# Patient Record
Sex: Male | Born: 1950 | Race: Black or African American | Hispanic: No | Smoking: Never smoker
Health system: Southern US, Community
[De-identification: ages and names within clinical notes are randomized; demographics above are authoritative.]

## PROBLEM LIST (undated history)

## (undated) DIAGNOSIS — I428 Other cardiomyopathies: Secondary | ICD-10-CM

## (undated) DIAGNOSIS — I340 Nonrheumatic mitral (valve) insufficiency: Secondary | ICD-10-CM

## (undated) DIAGNOSIS — I5022 Chronic systolic (congestive) heart failure: Secondary | ICD-10-CM

## (undated) DIAGNOSIS — I471 Supraventricular tachycardia: Secondary | ICD-10-CM

## (undated) DIAGNOSIS — I10 Essential (primary) hypertension: Secondary | ICD-10-CM

## (undated) DIAGNOSIS — I4719 Other supraventricular tachycardia: Secondary | ICD-10-CM

## (undated) DIAGNOSIS — I4819 Other persistent atrial fibrillation: Secondary | ICD-10-CM

## (undated) DIAGNOSIS — I429 Cardiomyopathy, unspecified: Secondary | ICD-10-CM

## (undated) DIAGNOSIS — I4892 Unspecified atrial flutter: Secondary | ICD-10-CM

## (undated) HISTORY — DX: Unspecified atrial flutter: I48.92

## (undated) HISTORY — DX: Supraventricular tachycardia: I47.1

## (undated) HISTORY — DX: Nonrheumatic mitral (valve) insufficiency: I34.0

## (undated) HISTORY — DX: Other supraventricular tachycardia: I47.19

---

## 2007-10-12 ENCOUNTER — Ambulatory Visit: Payer: Self-pay | Admitting: Critical Care Medicine

## 2007-10-12 ENCOUNTER — Encounter: Payer: Self-pay | Admitting: Emergency Medicine

## 2007-10-12 ENCOUNTER — Inpatient Hospital Stay (HOSPITAL_COMMUNITY): Admission: AD | Admit: 2007-10-12 | Discharge: 2007-10-28 | Payer: Self-pay | Admitting: *Deleted

## 2007-10-15 ENCOUNTER — Encounter (INDEPENDENT_AMBULATORY_CARE_PROVIDER_SITE_OTHER): Payer: Self-pay | Admitting: *Deleted

## 2007-10-25 ENCOUNTER — Encounter: Payer: Self-pay | Admitting: Internal Medicine

## 2007-10-28 ENCOUNTER — Ambulatory Visit: Payer: Self-pay | Admitting: Vascular Surgery

## 2008-09-15 ENCOUNTER — Inpatient Hospital Stay (HOSPITAL_COMMUNITY): Admission: EM | Admit: 2008-09-15 | Discharge: 2008-09-27 | Payer: Self-pay | Admitting: Emergency Medicine

## 2008-09-16 ENCOUNTER — Encounter: Payer: Self-pay | Admitting: Cardiovascular Disease

## 2008-11-17 ENCOUNTER — Ambulatory Visit (HOSPITAL_COMMUNITY): Admission: RE | Admit: 2008-11-17 | Discharge: 2008-11-17 | Payer: Self-pay | Admitting: Cardiology

## 2010-03-21 HISTORY — PX: CARDIAC ELECTROPHYSIOLOGY STUDY AND ABLATION: SHX1294

## 2010-05-05 ENCOUNTER — Ambulatory Visit (HOSPITAL_COMMUNITY)
Admission: RE | Admit: 2010-05-05 | Discharge: 2010-05-05 | Disposition: A | Discharge status: Home / Self Care | Payer: 59 | Source: Ambulatory Visit | Attending: Cardiology | Admitting: Cardiology

## 2010-05-05 DIAGNOSIS — Z01818 Encounter for other preprocedural examination: Secondary | ICD-10-CM | POA: Insufficient documentation

## 2010-05-05 DIAGNOSIS — Z01810 Encounter for preprocedural cardiovascular examination: Secondary | ICD-10-CM | POA: Insufficient documentation

## 2010-05-05 DIAGNOSIS — I4891 Unspecified atrial fibrillation: Secondary | ICD-10-CM | POA: Insufficient documentation

## 2010-05-05 LAB — CBC
HCT: 40.7 % (ref 39.0–52.0)
MCH: 29.4 pg (ref 26.0–34.0)
MCHC: 33.4 g/dL (ref 30.0–36.0)
MCV: 88.1 fL (ref 78.0–100.0)
Platelets: 112 10*3/uL — ABNORMAL LOW (ref 150–400)
RDW: 14.6 % (ref 11.5–15.5)
WBC: 3.7 10*3/uL — ABNORMAL LOW (ref 4.0–10.5)

## 2010-05-05 LAB — BASIC METABOLIC PANEL
BUN: 16 mg/dL (ref 6–23)
CO2: 24 mEq/L (ref 19–32)
Chloride: 105 mEq/L (ref 96–112)
Potassium: 4.2 mEq/L (ref 3.5–5.1)

## 2010-05-05 LAB — MAGNESIUM: Magnesium: 2.2 mg/dL (ref 1.5–2.5)

## 2010-05-05 LAB — APTT: aPTT: 51 seconds — ABNORMAL HIGH (ref 24–37)

## 2010-05-19 NOTE — Op Note (Signed)
  [name], [name]               ACCOUNT NO.:  [id number]  MEDICAL RECORD NO.:  [id number]           PATIENT TYPE:  O  LOCATION:  [facility]                         FACILITY:  [facility]  PHYSICIAN:  Ritta Slot, MD     DATE OF BIRTH:  Aug 09, 1950  DATE OF PROCEDURE:  05/05/2010 DATE OF DISCHARGE:  05/05/2010                              OPERATIVE REPORT   This is a DC cardioversion.  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  His INR has also been greater than 2.0 for 4 consecutive weeks over the past 4 weeks, and thus it was decided to proceed with direct cardioversion without TEE.  After informed consent, the patient was induced with general anesthesia by Dr. Randa Evens who gave him 150 mg of IV propofol.  He received 1 biphasic synchronized shock with 150 joules, which showed normal sinus rhythm immediately.  PLAN:  The patient will follow up and will be discharged home in an hour.  Will continue all his outpatient medications.  He will follow up with me in 1 week.     Ritta Slot, MD     HS/MEDQ  D:  05/05/2010  T:  05/06/2010  Job:  [id number]  Electronically Signed by Ritta Slot MD on 05/19/2010 02:26:42 PM

## 2010-06-09 ENCOUNTER — Other Ambulatory Visit: Payer: Self-pay | Admitting: Cardiology

## 2010-06-09 ENCOUNTER — Ambulatory Visit
Admission: RE | Admit: 2010-06-09 | Discharge: 2010-06-09 | Disposition: A | Discharge status: Home / Self Care | Payer: 59 | Source: Ambulatory Visit | Attending: Cardiology | Admitting: Cardiology

## 2010-06-09 DIAGNOSIS — R0602 Shortness of breath: Secondary | ICD-10-CM

## 2010-06-10 ENCOUNTER — Ambulatory Visit (HOSPITAL_COMMUNITY)
Admission: RE | Admit: 2010-06-10 | Discharge: 2010-06-10 | Disposition: A | Discharge status: Home / Self Care | Payer: 59 | Source: Ambulatory Visit | Attending: Cardiology | Admitting: Cardiology

## 2010-06-10 ENCOUNTER — Encounter (HOSPITAL_COMMUNITY): Payer: 59

## 2010-06-10 ENCOUNTER — Inpatient Hospital Stay (HOSPITAL_COMMUNITY)
Admission: RE | Admit: 2010-06-10 | Discharge: 2010-06-10 | Disposition: A | Discharge status: Home / Self Care | Payer: 59 | Source: Ambulatory Visit | Attending: Cardiology | Admitting: Cardiology

## 2010-06-10 DIAGNOSIS — Z79899 Other long term (current) drug therapy: Secondary | ICD-10-CM | POA: Insufficient documentation

## 2010-06-10 DIAGNOSIS — R079 Chest pain, unspecified: Secondary | ICD-10-CM | POA: Insufficient documentation

## 2010-06-10 DIAGNOSIS — Z01811 Encounter for preprocedural respiratory examination: Secondary | ICD-10-CM | POA: Insufficient documentation

## 2010-06-10 DIAGNOSIS — I4891 Unspecified atrial fibrillation: Secondary | ICD-10-CM | POA: Insufficient documentation

## 2010-06-10 LAB — BLOOD GAS, ARTERIAL
Acid-Base Excess: 1.9 mmol/L (ref 0.0–2.0)
Bicarbonate: 25.7 mEq/L — ABNORMAL HIGH (ref 20.0–24.0)
Drawn by: 24231
pCO2 arterial: 38.6 mmHg (ref 35.0–45.0)
pO2, Arterial: 78.2 mmHg — ABNORMAL LOW (ref 80.0–100.0)

## 2010-06-11 ENCOUNTER — Ambulatory Visit (HOSPITAL_COMMUNITY)
Admission: RE | Admit: 2010-06-11 | Discharge: 2010-06-11 | Disposition: A | Discharge status: Home / Self Care | Payer: 59 | Source: Ambulatory Visit | Attending: Cardiology | Admitting: Cardiology

## 2010-06-11 DIAGNOSIS — Z8249 Family history of ischemic heart disease and other diseases of the circulatory system: Secondary | ICD-10-CM | POA: Insufficient documentation

## 2010-06-11 DIAGNOSIS — I10 Essential (primary) hypertension: Secondary | ICD-10-CM | POA: Insufficient documentation

## 2010-06-11 DIAGNOSIS — I4891 Unspecified atrial fibrillation: Secondary | ICD-10-CM | POA: Insufficient documentation

## 2010-06-11 HISTORY — PX: LOOP RECORDER IMPLANT: SHX5954

## 2010-06-11 LAB — PROTIME-INR: INR: 1.63 — ABNORMAL HIGH (ref 0.00–1.49)

## 2010-06-15 NOTE — Procedures (Signed)
NAMESAMARTH, OGLE               ACCOUNT NO.:  [id number]  MEDICAL RECORD NO.:  [id number]           PATIENT TYPE:  O  LOCATION:  [facility]                         FACILITY:  [facility]  PHYSICIAN:  Ritta Slot, MD     DATE OF BIRTH:  20-Jan-1951  DATE OF PROCEDURE:  06/11/2010 DATE OF DISCHARGE:  06/11/2010                           CARDIAC CATHETERIZATION   This is a left heart catheterization.  INDICATIONS:  Ms. Yehuda Printup is a very pleasant 60 year old African American gentleman who has a persistent intermittent atrial fibrillation.  He was seen at [facility] by Dr. Hurman Horn and is due to undergo convergent AFib ablation on July 15, 2010.  However, it was decided that he needed to have a diagnostic left heart catheterization to exclude coronary causes of mildly decreased LV function.  He was therefore brought to [facility].  After informed consent, he was placed in supine position.  His right wrist was prepped and draped in the usual sterile fashion.  APPROACH:  Right radial artery 5-French sheath.  SEDATION:  1 mg of Versed and 50 mcg of fentanyl.  INDICATIONS:  Atrial fibrillation, decreased EF, exclude CAD.  DESCRIPTION OF PROCEDURE:  After informed consent, the patient was placed in supine position.  His right wrist was prepped and draped in usual sterile fashion.  Using modified Seldinger technique, a 5-French sheath was placed in right radial artery.  Through the right radial artery was placed a Tiger catheter.  This was used to engage the left coronary circulation system.  Visualization of the LAD and circumflex were obtained in AP cranial, AP caudal, LAO cranial, LAO caudal, and RAO cranial views.  The Tiger catheter was attempted to engage in the right coronary artery, but was was unsuccessful.  We then exchanged the Tiger catheter over exchange wire for a JR-4 catheter.  This was engaged into the RCA without difficulty.   Visualization of the RCA was obtained in the LAO cranial and RAO cranial views.  The JR-4 catheter was then exchanged over the exchange wire for an angled pigtail.  Angled pigtail was placed in the LV without difficulty.  Visualization of the LV was obtained using 36 mL of contrast at 12 mL per second.  FINDINGS: 1. AO pressure 93/63 with mean of 76.  LV pressure 89/11, LVEDP of 17.     LV pullback 90/13, LVEDP of 19.  AO pullback 90/53, mean of 91/53,     mean of 71. 2. The right coronary artery was a small vessel, nondominant, was free     of any disease.  The left main bifurcated into an LAD and     circumflex.  The circumflex was a large dominant vessel giving rise     to three OMs.  There is no significant disease in the circumflex     artery.  The LAD was a large vessel giving rise to two diagonals.     There is no significant disease in the LAD diagonal territory.  LV     function was found to be 45% global hypokinesis.  No significant  mitral regurgitation.  FINAL CONCLUSIONS: 1. Normal coronaries, mild global LV dysfunction, most likely AFib     mediated. 2. No mitral regurgitation.  FINDINGS:  Normal cardiac catheterization.  The plan will be to go ahead and proceed with loop recorder implantation for AFIB monitoring.     Ritta Slot, MD     HS/MEDQ  D:  06/11/2010  T:  06/12/2010  Job:  [id number]  Electronically Signed by Ritta Slot MD on 06/15/2010 02:43:19 PM

## 2010-06-26 LAB — PROTIME-INR
INR: 2.6 — ABNORMAL HIGH (ref 0.00–1.49)
Prothrombin Time: 27.8 seconds — ABNORMAL HIGH (ref 11.6–15.2)

## 2010-06-27 LAB — BASIC METABOLIC PANEL
BUN: 12 mg/dL (ref 6–23)
BUN: 13 mg/dL (ref 6–23)
BUN: 13 mg/dL (ref 6–23)
BUN: 15 mg/dL (ref 6–23)
CO2: 29 mEq/L (ref 19–32)
CO2: 30 mEq/L (ref 19–32)
CO2: 31 mEq/L (ref 19–32)
CO2: 32 mEq/L (ref 19–32)
Calcium: 8.4 mg/dL (ref 8.4–10.5)
Calcium: 8.4 mg/dL (ref 8.4–10.5)
Calcium: 8.5 mg/dL (ref 8.4–10.5)
Calcium: 9 mg/dL (ref 8.4–10.5)
Calcium: 9 mg/dL (ref 8.4–10.5)
Chloride: 100 mEq/L (ref 96–112)
Creatinine, Ser: 1.42 mg/dL (ref 0.4–1.5)
Creatinine, Ser: 1.46 mg/dL (ref 0.4–1.5)
Creatinine, Ser: 1.47 mg/dL (ref 0.4–1.5)
Creatinine, Ser: 1.48 mg/dL (ref 0.4–1.5)
Creatinine, Ser: 1.56 mg/dL — ABNORMAL HIGH (ref 0.4–1.5)
Creatinine, Ser: 1.68 mg/dL — ABNORMAL HIGH (ref 0.4–1.5)
GFR calc Af Amer: 51 mL/min — ABNORMAL LOW (ref >60)
GFR calc Af Amer: 54 mL/min — ABNORMAL LOW (ref >60)
GFR calc Af Amer: >60 mL/min (ref >60)
GFR calc Af Amer: >60 mL/min (ref >60)
GFR calc non Af Amer: 42 mL/min — ABNORMAL LOW (ref >60)
GFR calc non Af Amer: 44 mL/min — ABNORMAL LOW (ref >60)
GFR calc non Af Amer: 46 mL/min — ABNORMAL LOW (ref >60)
GFR calc non Af Amer: 49 mL/min — ABNORMAL LOW (ref >60)
Glucose, Bld: 103 mg/dL — ABNORMAL HIGH (ref 70–99)
Glucose, Bld: 163 mg/dL — ABNORMAL HIGH (ref 70–99)
Glucose, Bld: 89 mg/dL (ref 70–99)
Glucose, Bld: 99 mg/dL (ref 70–99)
Potassium: 4.4 mEq/L (ref 3.5–5.1)
Sodium: 132 mEq/L — ABNORMAL LOW (ref 135–145)

## 2010-06-27 LAB — PROTIME-INR
INR: 1.4 (ref 0.00–1.49)
INR: 1.5 (ref 0.00–1.49)
INR: 1.6 — ABNORMAL HIGH (ref 0.00–1.49)
INR: 1.8 — ABNORMAL HIGH (ref 0.00–1.49)
INR: 2.6 — ABNORMAL HIGH (ref 0.00–1.49)
INR: 3.4 — ABNORMAL HIGH (ref 0.00–1.49)
Prothrombin Time: 18.4 seconds — ABNORMAL HIGH (ref 11.6–15.2)
Prothrombin Time: 19.3 seconds — ABNORMAL HIGH (ref 11.6–15.2)
Prothrombin Time: 22.1 seconds — ABNORMAL HIGH (ref 11.6–15.2)
Prothrombin Time: 29.3 seconds — ABNORMAL HIGH (ref 11.6–15.2)
Prothrombin Time: 30.6 seconds — ABNORMAL HIGH (ref 11.6–15.2)

## 2010-06-27 LAB — DIGOXIN LEVEL
Digoxin Level: 0.6 ng/mL — ABNORMAL LOW (ref 0.8–2.0)
Digoxin Level: 1 ng/mL (ref 0.8–2.0)

## 2010-06-27 LAB — CBC
HCT: 43.5 % (ref 39.0–52.0)
Hemoglobin: 14.6 g/dL (ref 13.0–17.0)
Hemoglobin: 15.1 g/dL (ref 13.0–17.0)
Hemoglobin: 15.3 g/dL (ref 13.0–17.0)
Hemoglobin: 16.3 g/dL (ref 13.0–17.0)
MCHC: 33.6 g/dL (ref 30.0–36.0)
MCHC: 33.8 g/dL (ref 30.0–36.0)
MCHC: 33.8 g/dL (ref 30.0–36.0)
MCHC: 33.9 g/dL (ref 30.0–36.0)
MCHC: 34.2 g/dL (ref 30.0–36.0)
MCV: 94.4 fL (ref 78.0–100.0)
Platelets: 109 10*3/uL — ABNORMAL LOW (ref 150–400)
Platelets: 112 10*3/uL — ABNORMAL LOW (ref 150–400)
Platelets: 114 10*3/uL — ABNORMAL LOW (ref 150–400)
Platelets: 117 10*3/uL — ABNORMAL LOW (ref 150–400)
Platelets: 120 10*3/uL — ABNORMAL LOW (ref 150–400)
RBC: 4.81 MIL/uL (ref 4.22–5.81)
RBC: 4.82 MIL/uL (ref 4.22–5.81)
RDW: 14.2 % (ref 11.5–15.5)
RDW: 14.2 % (ref 11.5–15.5)
RDW: 14.4 % (ref 11.5–15.5)
RDW: 14.4 % (ref 11.5–15.5)
RDW: 14.4 % (ref 11.5–15.5)
RDW: 14.8 % (ref 11.5–15.5)
WBC: 7.6 10*3/uL (ref 4.0–10.5)

## 2010-06-27 LAB — MAGNESIUM
Magnesium: 1.8 mg/dL (ref 1.5–2.5)
Magnesium: 2.2 mg/dL (ref 1.5–2.5)

## 2010-06-27 LAB — COMPREHENSIVE METABOLIC PANEL
Alkaline Phosphatase: 69 U/L (ref 39–117)
BUN: 13 mg/dL (ref 6–23)
BUN: 15 mg/dL (ref 6–23)
CO2: 27 mEq/L (ref 19–32)
Calcium: 8.8 mg/dL (ref 8.4–10.5)
Chloride: 91 mEq/L — ABNORMAL LOW (ref 96–112)
Chloride: 94 mEq/L — ABNORMAL LOW (ref 96–112)
Creatinine, Ser: 1.55 mg/dL — ABNORMAL HIGH (ref 0.4–1.5)
Creatinine, Ser: 1.56 mg/dL — ABNORMAL HIGH (ref 0.4–1.5)
GFR calc non Af Amer: 46 mL/min — ABNORMAL LOW (ref >60)
Glucose, Bld: 100 mg/dL — ABNORMAL HIGH (ref 70–99)
Glucose, Bld: 95 mg/dL (ref 70–99)
Potassium: 3.8 mEq/L (ref 3.5–5.1)
Total Bilirubin: 1.2 mg/dL (ref 0.3–1.2)
Total Bilirubin: 1.3 mg/dL — ABNORMAL HIGH (ref 0.3–1.2)
Total Protein: 6.8 g/dL (ref 6.0–8.3)

## 2010-06-27 LAB — HEPARIN LEVEL (UNFRACTIONATED)
Heparin Unfractionated: 0.49 IU/mL (ref 0.30–0.70)
Heparin Unfractionated: 0.54 IU/mL (ref 0.30–0.70)
Heparin Unfractionated: 0.73 IU/mL — ABNORMAL HIGH (ref 0.30–0.70)
Heparin Unfractionated: 0.75 IU/mL — ABNORMAL HIGH (ref 0.30–0.70)
Heparin Unfractionated: 0.97 IU/mL — ABNORMAL HIGH (ref 0.30–0.70)

## 2010-06-27 LAB — BRAIN NATRIURETIC PEPTIDE
Pro B Natriuretic peptide (BNP): 659 pg/mL — ABNORMAL HIGH (ref 0.0–100.0)
Pro B Natriuretic peptide (BNP): 726 pg/mL — ABNORMAL HIGH (ref 0.0–100.0)
Pro B Natriuretic peptide (BNP): 914 pg/mL — ABNORMAL HIGH (ref 0.0–100.0)

## 2010-06-28 LAB — BASIC METABOLIC PANEL
BUN: 22 mg/dL (ref 6–23)
CO2: 25 mEq/L (ref 19–32)
CO2: 28 mEq/L (ref 19–32)
Calcium: 8.7 mg/dL (ref 8.4–10.5)
Calcium: 8.8 mg/dL (ref 8.4–10.5)
Chloride: 102 mEq/L (ref 96–112)
Chloride: 106 mEq/L (ref 96–112)
Creatinine, Ser: 1.83 mg/dL — ABNORMAL HIGH (ref 0.4–1.5)
Creatinine, Ser: 1.93 mg/dL — ABNORMAL HIGH (ref 0.4–1.5)
GFR calc Af Amer: 44 mL/min — ABNORMAL LOW (ref >60)
GFR calc Af Amer: 46 mL/min — ABNORMAL LOW (ref >60)
GFR calc Af Amer: 52 mL/min — ABNORMAL LOW (ref >60)
GFR calc non Af Amer: 43 mL/min — ABNORMAL LOW (ref >60)
Glucose, Bld: 103 mg/dL — ABNORMAL HIGH (ref 70–99)
Glucose, Bld: 86 mg/dL (ref 70–99)
Potassium: 2.7 mEq/L — CL (ref 3.5–5.1)
Potassium: 3.7 mEq/L (ref 3.5–5.1)
Sodium: 140 mEq/L (ref 135–145)
Sodium: 141 mEq/L (ref 135–145)

## 2010-06-28 LAB — PROTIME-INR
INR: 1.3 (ref 0.00–1.49)
Prothrombin Time: 17 seconds — ABNORMAL HIGH (ref 11.6–15.2)
Prothrombin Time: 18.1 seconds — ABNORMAL HIGH (ref 11.6–15.2)

## 2010-06-28 LAB — CBC
HCT: 43.9 % (ref 39.0–52.0)
HCT: 45.5 % (ref 39.0–52.0)
Hemoglobin: 14.9 g/dL (ref 13.0–17.0)
MCHC: 33.8 g/dL (ref 30.0–36.0)
MCV: 95.2 fL (ref 78.0–100.0)
MCV: 95.3 fL (ref 78.0–100.0)
MCV: 95.4 fL (ref 78.0–100.0)
Platelets: 100 10*3/uL — ABNORMAL LOW (ref 150–400)
Platelets: 95 10*3/uL — ABNORMAL LOW (ref 150–400)
RBC: 4.43 MIL/uL (ref 4.22–5.81)
RBC: 4.61 MIL/uL (ref 4.22–5.81)
RDW: 14.6 % (ref 11.5–15.5)
WBC: 6.2 10*3/uL (ref 4.0–10.5)

## 2010-06-28 LAB — HEPATIC FUNCTION PANEL
ALT: 47 U/L (ref 0–53)
AST: 51 U/L — ABNORMAL HIGH (ref 0–37)
Albumin: 3 g/dL — ABNORMAL LOW (ref 3.5–5.2)
Alkaline Phosphatase: 54 U/L (ref 39–117)
Total Bilirubin: 1.9 mg/dL — ABNORMAL HIGH (ref 0.3–1.2)

## 2010-06-28 LAB — DIFFERENTIAL
Basophils Relative: 1 % (ref 0–1)
Eosinophils Absolute: 0 10*3/uL (ref 0.0–0.7)
Eosinophils Relative: 0 % (ref 0–5)
Monocytes Absolute: 0.5 10*3/uL (ref 0.1–1.0)
Monocytes Relative: 7 % (ref 3–12)

## 2010-06-28 LAB — CARDIAC PANEL(CRET KIN+CKTOT+MB+TROPI)
CK, MB: 2.8 ng/mL (ref 0.3–4.0)
CK, MB: 3.3 ng/mL (ref 0.3–4.0)
Relative Index: 0.8 (ref 0.0–2.5)
Total CK: 387 U/L — ABNORMAL HIGH (ref 7–232)
Total CK: 411 U/L — ABNORMAL HIGH (ref 7–232)
Troponin I: 0.04 ng/mL (ref 0.00–0.06)
Troponin I: 0.04 ng/mL (ref 0.00–0.06)

## 2010-06-28 LAB — HEPARIN LEVEL (UNFRACTIONATED)
Heparin Unfractionated: 0.49 IU/mL (ref 0.30–0.70)
Heparin Unfractionated: 0.6 IU/mL (ref 0.30–0.70)
Heparin Unfractionated: 0.77 IU/mL — ABNORMAL HIGH (ref 0.30–0.70)

## 2010-06-28 LAB — URINALYSIS, ROUTINE W REFLEX MICROSCOPIC
Bilirubin Urine: NEGATIVE
Hgb urine dipstick: NEGATIVE
Specific Gravity, Urine: 1.006 (ref 1.005–1.030)
Urobilinogen, UA: 1 mg/dL (ref 0.0–1.0)

## 2010-06-28 LAB — POCT I-STAT, CHEM 8
BUN: 23 mg/dL (ref 6–23)
Calcium, Ion: 1.06 mmol/L — ABNORMAL LOW (ref 1.12–1.32)
Chloride: 106 mEq/L (ref 96–112)
Glucose, Bld: 114 mg/dL — ABNORMAL HIGH (ref 70–99)

## 2010-06-28 LAB — DIGOXIN LEVEL: Digoxin Level: <0.2 ng/mL — ABNORMAL LOW (ref 0.8–2.0)

## 2010-06-28 LAB — CK TOTAL AND CKMB (NOT AT ARMC): Relative Index: 1.2 (ref 0.0–2.5)

## 2010-06-28 LAB — BRAIN NATRIURETIC PEPTIDE: Pro B Natriuretic peptide (BNP): 819 pg/mL — ABNORMAL HIGH (ref 0.0–100.0)

## 2010-06-28 LAB — LIPID PANEL
Cholesterol: 138 mg/dL (ref 0–200)
Triglycerides: 36 mg/dL (ref <150)

## 2010-06-28 LAB — MAGNESIUM: Magnesium: 1.6 mg/dL (ref 1.5–2.5)

## 2010-06-28 LAB — APTT: aPTT: 28 seconds (ref 24–37)

## 2010-06-28 LAB — PHOSPHORUS: Phosphorus: 4.8 mg/dL — ABNORMAL HIGH (ref 2.3–4.6)

## 2010-06-28 LAB — POCT CARDIAC MARKERS
CKMB, poc: 1.5 ng/mL (ref 1.0–8.0)
Troponin i, poc: <0.05 ng/mL (ref 0.00–0.09)

## 2010-08-03 NOTE — Discharge Summary (Signed)
[name], [name]               ACCOUNT NO.:  [id number]   MEDICAL RECORD NO.:  [id number]          PATIENT TYPE:  INP   LOCATION:  2034                         FACILITY:  [facility]   PHYSICIAN:  Raymon Mutton, P.A. DATE OF BIRTH:  1950-08-03   DATE OF ADMISSION:  10/12/2007  DATE OF DISCHARGE:  10/28/2007                               DISCHARGE SUMMARY   DISCHARGING PHYSICIAN:  Dr. Tresa Endo.   DISCHARGE DIAGNOSES:  1. Status post acute vent-dependent respiratory failure - resolved.  2. Atrial fibrillation, new onset with rapid ventricular response.      Status post unsuccessful DCCV on October 15, 2007, - the patient      converted to sinus rhythm on amiodarone therapy.  3. 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  4. Severe mitral regurgitation.  5. Coumadin anticoagulation therapy - subtherapeutic INR on discharge.  6. New onset systolic congestive heart failure.  7. Recent bronchitis - resolved.  8. Hyperuricemia.  9. Obesity.  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   HOSPITAL COURSE:  This is a 60 year old African American gentleman  without any significant previous history of coronary artery disease,  diabetes, or hypertension, who considered himself healthy up until this  hospital admission. He recently had a mild case of bronchitis, which was  treated with Levaquin antibiotics, and the patient presented to the  [facility] Emergency Department with complaints of  palpitations, lower extremity swelling, and shortness of breath.  In the  emergency room, he was put on telemonitor.  His heart rate revealed  atrial fibrillation with rapid ventricular response at 170 beats per  minute.  The patient was started on Cardizem drip and heparin.  We  cycled his cardiac enzymes that revealed abnormal CPK, but normal MB and  troponins.  His TSH was normal as well, but uric acid revealed abnormal  elevated result at 10.7.   We started him on IV diuretics and the patient responded greatly.  His  urinary output was around 60-100 mL with fluid intake 2780 on October 14, 2006.  He continues to give out significant amount of fluids on IV  diuretics, and his heart rate remained hard to control on Cardizem drip,  so amiodarone IV was added and the patient was given p.o. digoxin.  Eventually, the heart rate improved, but the patient still remained in  atrial fibrillation and an attempt was made to have a TEE-guided  cardioversion on October 15, 2007, by Dr. Lynnea Ferrier, but the patient did not  hold sinus rhythm even after three shocks, and decision was made to  proceed with rate-control medication, so we continue loading with  digoxin, amiodarone, and Cardizem p.o., as well as beta-blockers.   On October 17, 2007, the patient developed severe stridor and underwent MRI  of the neck, which revealed abnormalities - edema versus hematoma with  some displacement of vocal cords.  The stridors remained persistent  during the day, so  we asked ENT specialist to evaluate the patient.  Dr.  Suszanne Conners saw the patient and diagnosed him with progressive laryngeal airway  obstruction secondary to increasing laryngeal swelling and 95% occlusion  of the glottic opening evolving into respiratory distress with  significant stridor.  The patient required tracheostomy and procedure  was performed under the general anesthesia.  He tolerated it well and  was kept on a ventilator for a couple more days.  Eventually, on October 19, 2007, was weaned off mechanical ventilation.  The patient was  continued to be treated with IV heparin and was started him on Coumadin  after he was cleared by ENT, but unfortunately his INR remained  persistently subtherapeutic until the day of discharge.  On October 26, 2007, the patient converted to sinus rhythm on amiodarone  therapy.  An  EKG was performed to confirm it.  We eventually switched him to Lovenox  prior to his discharge to home and allowed him to go home on Lovenox,  Coumadin bridge with plan to follow up his INR on October 30, 2007.   HOSPITAL LABORATORIES:  His enzymes as mentioned were negative and he  was ruled out for MI.  Lipids reveal normal total cholesterol and  triglycerides, but his HDL was low at 27 and LDL was 103.  His initial  BNP was 895 and BNP on the day of discharge was 103.   Hemoglobin was 13.9 and hematocrit 42.3.  White blood cell count 5.2,  platelets 213, sodium 138, potassium 4.38, BUN 13, creatinine 135,  chloride 100, CO2 32, and glucose 91.   INR at the day of discharge was 1.4 and the patient was sent home on 7.5  mg of Coumadin and 120 mg of Lovenox every 12 hours.   Digoxin level was 0.8 and magnesium 2.2.   DISCHARGE MEDICATIONS:  1. Lovenox 120 mg b.i.d.  2. Lopressor 50 mg b.i.d.  3. Cardizem 360 mg daily.  4. Lanoxin 0.25 mg daily.  5. Amiodarone 200 mg b.i.d.  6. Lasix 40 mg twice a day in a.m. and at 2:00 p.m.  7. Coumadin 7.5 mg q.p.m.   DISCHARGE FOLLOWUP:  Our office will contact the patient to schedule him  for followup appointment with Dr. Lynnea Ferrier.      Raymon Mutton, P.A.     MK/MEDQ  D:  10/28/2007  T:  10/29/2007  Job:  [id number]   cc:   Ritta Slot, MD  [facility]

## 2010-08-03 NOTE — Discharge Summary (Signed)
[name], [name]               ACCOUNT NO.:  [id number]   MEDICAL RECORD NO.:  [id number]          PATIENT TYPE:  INP   LOCATION:  2003                         FACILITY:  [facility]   PHYSICIAN:  Ritta Slot, MD     DATE OF BIRTH:  Mar 25, 1950   DATE OF ADMISSION:  09/15/2008  DATE OF DISCHARGE:  09/27/2008                               DISCHARGE SUMMARY   DISCHARGE DIAGNOSES:  1. Acute-on-chronic systolic heart failure, improved at discharge.  2. Paroxysmal atrial fibrillation, sinus rhythm at discharge on      amiodarone.  3. Coumadin therapy, INR 3.9 at discharge.  4. Renal insufficiency with a creatinine of 1.5.  5. Presumed nonischemic cardiomyopathy with an ejection fraction of 20-      25% by echo with right ventricular dysfunction and severe mitral      regurgitation and markedly enlarged left atrium.  6. Nonischemic Myoview, October 2009.  7. Bursitis, right hip, seen by Dr. Carola Frost, this admission, improving      at discharge.   HOSPITAL COURSE:  The patient is a 60 year old male followed by Dr.  Lynnea Ferrier who was sent to the [facility] ER from Urgent Care with AFib with  rapid ventricular response of 180.  On previous 2 weeks, the patient had  been dyspneic.  He has had previous history of atrial fibrillation in  July 2009.  EF at that time was 25% with severe MR.  He had an  unsuccessful cardioversion, which was complicated by laryngeal hematoma  and required tracheostomy.  By October 2009, he had had a negative  Myoview study and his EF then improved to 60%.  He was on Coumadin and  amiodarone for about 6 months and then this was discontinued.  The  patient was seen by Dr. Mariah Milling on admission and was found to be in rapid  atrial fibrillation and heart failure.  His BNP was 2225.  Creatinine  was 2.1.  He was admitted to telemetry, started on IV heparin and beta-  blockers and diltiazem were added as well as IV diuretics.  His rate was  difficult to control.  He improved with  diuresis, but his rate remained  labile.  It was decided not to proceed with diagnostic catheterization  based on his recent negative Myoview and renal insufficiency.  We hope  his ejection fraction would improve with sinus rhythm.  His EF is 20-  25%.  He was put on amiodarone and we adjusted his medications for rate  control.  He required fairly high doses of rate control medications.  Eventually, he converted to sinus rhythm on September 27, 2008.  In the  interim, the patient did develop some bursitis in his right hip and was  seen in consult by Dr. Carola Frost with [facility].  He improved with  physical therapy and treatment including low-dose anti-inflammatories  and ice, and physical therapy.  We feel, he can be discharged on September 27, 2008.  He will need to follow up with Dr. Lynnea Ferrier.  Dr. Carola Frost would  like to see him if he does not improve in a  couple weeks.   DISCHARGE MEDICATIONS:  1. Lasix 40 mg a day.  2. Diltiazem 180 mg a day.  3. Amiodarone 400 mg a day.  4. Lanoxin 0.25 mg a day.  5. Metoprolol 50 mg b.i.d.  6. Warfarin as directed.   He will be given a prescription for 5 mg of warfarin, although I should  note that he has not had any warfarin until the last 3 days and his INR  is 3.9.  We will have him hold his warfarin today and Sunday, and take  2.5 Monday and have a Protime on Tuesday.   LABORATORIES AT DISCHARGE:  Sodium 138, potassium 3.8, BUN 15, and  creatinine 1.56.  Digoxin level is 1.0.  EKG shows sinus rhythm.  Discharge weight is 108.5 kg.  INR is 3.5.  Digoxin level is 1.0.  Liver  functions are essentially normal, his SGOT is slightly elevated at 50.  TSH 0.68.  Lipid panel shows cholesterol 138, triglycerides 36, HDL 27,  and LDL 104.  Troponins and CK-MB are negative.  Magnesium is 2.2.  Head  films showed no acute findings, there is an old healed lesser trochanter  fracture.  Pelvic film showed mild degenerative disease of the SI  joints.  Chest  x-ray shows cardiomegaly and right basilar atelectasis.  Echocardiogram shows an EF of 20-25% with severe MR, moderate to  severely dilated left atrium; moderately dilated RV with moderate to  severe reduction in his RV function.  Right atrium was moderately  dilated as well.  Pulmonary artery peak pressure is 33 mm.   DISPOSITION:  The patient is discharged in stable condition.  He will  follow up with Dr. Lynnea Ferrier.  He will have a Protime next week.      Abelino Derrick, P.A.      Ritta Slot, MD  Electronically Signed    LKK/MEDQ  D:  09/27/2008  T:  09/27/2008  Job:  [id number]   cc:   Ritta Slot, MD  Doralee Albino. Carola Frost, M.D.

## 2010-08-03 NOTE — Op Note (Signed)
[name], [name]               ACCOUNT NO.:  [id number]   MEDICAL RECORD NO.:  [id number]          PATIENT TYPE:  INP   LOCATION:  2912                         FACILITY:  [facility]   PHYSICIAN:  Newman Pies, MD            DATE OF BIRTH:  Oct 25, 1950   DATE OF PROCEDURE:  10/17/2007  DATE OF DISCHARGE:                               OPERATIVE REPORT   SURGEON:  Newman Pies, MD   PREOPERATIVE DIAGNOSES:  1. Laryngeal airway obstruction.  2. Respiratory distress, with significant stridor.   POSTOPERATIVE DIAGNOSES:  1. Laryngeal airway obstruction.  2. Respiratory distress, with significant stridor.   PROCEDURE PERFORMED:  Tracheostomy.   ANESTHESIA:  General endotracheal tube anesthesia.   COMPLICATIONS:  None.   ESTIMATED BLOOD LOSS:  None.   INDICATIONS FOR PROCEDURE:  The patient is a 60 year old African  American male with history of congestive heart failure, atrial  fibrillation, and mitral valve regurgitation.  Since Monday, October 15, 2007, the patient has been complaining of progressive dyspnea and  increasing stridor.  An MRI scan was performed on October 17, 2007.  It  showed significant soft tissue obstruction of the laryngeal opening.  A  subsequent bedside fiberoptic laryngoscopy shows a large soft tissue  mass/hematoma of the laryngeal inlet.  The soft tissue edema/hematoma  appeared to obstruct more than 95% of the laryngeal opening.  Based on  the findings, the decision was made for the patient to be intubated, and  the tracheostomy tube placed to protect his airway.  The risks,  benefits, alternatives, and details of the procedure were discussed with  the patient and his family members.  Questions were invited and  answered.  They would like to proceed with the procedure.  Informed  consent was obtained.   DESCRIPTION OF PROCEDURE:  The patient was taken to the operating room  and placed supine on the operating table.  It should be noted that the  patient was intubated  prior to arrival to the OR due to his progressive  respiratory distress.  Lidocaine 1% with 1:100,000 epinephrine was  injected at the planned site of incision.  The patient was given 1 g of  Ancef prior to the incision.  A 2-cm vertical incision was made at the  level slightly below the cricoid.  The vertical incision was carried  down to the level of the platysma muscles.  The vertical incision was  carried down to the level of the strap muscles.  The strap muscles were  divided at midline and retracted laterally.  The thyroid isthmus was  identified.  The thyroid isthmus was divided at midline and retracted  laterally.  The anterior tracheal wall was identified.  A tracheal  window was made at the level of the third tracheal ring.  The  endotracheal tube was subsequently withdrawn.  A #8 cuffed Shiley  tracheostomy tube was placed without difficulty.  The tracheostomy tube  was sutured in place with 2-0 Prolene sutures and a circumferential neck  tie.  The care of the patient was turned over to  the anesthesiologist.  The patient was awakened from anesthesia without difficulty.  He was  transferred back to the intensive care unit in stable condition.   OPERATIVE FINDINGS:  A #8 cuffed Shiley tracheostomy tube was placed  without difficulty.   SPECIMENS REMOVED:  None.   FOLLOWUP CARE:  The patient will be returned to the intensive care unit.  Once the patient's laryngeal obstruction improves, the tracheostomy tube  could be decannulated at that time.      Newman Pies, MD  Electronically Signed     ST/MEDQ  D:  10/17/2007  T:  10/18/2007  Job:  [id number]

## 2010-08-03 NOTE — Discharge Summary (Signed)
NAMEJERAMIAH, MCCAUGHEY               ACCOUNT NO.:  [id number]   MEDICAL RECORD NO.:  [id number]          PATIENT TYPE:  INP   LOCATION:  2003                         FACILITY:  [facility]   PHYSICIAN:  Antonieta Iba, MD   DATE OF BIRTH:  Sep 18, 1950   DATE OF ADMISSION:  09/15/2008  DATE OF DISCHARGE:  09/27/2008                               DISCHARGE SUMMARY   ADDENDUM   We told Mr. [place], he can take Advil 600 mg twice a day for hip pain  if needed, but he should probably stop this after about 5 days.      Abelino Derrick, P.A.      Antonieta Iba, MD  Electronically Signed    LKK/MEDQ  D:  09/27/2008  T:  09/27/2008  Job:  [id number]

## 2010-08-03 NOTE — H&P (Signed)
NAMEJAEVION, GOTO               ACCOUNT NO.:  [id number]   MEDICAL RECORD NO.:  [id number]          PATIENT TYPE:  OIB   LOCATION:  2899                         FACILITY:  [facility]   PHYSICIAN:  Ritta Slot, MD     DATE OF BIRTH:  April 19, 1950   DATE OF ADMISSION:  11/17/2008  DATE OF DISCHARGE:  11/17/2008                              HISTORY & PHYSICAL   This is a cardioversion dictation.   PATIENT PROFILE:  [name] is a very pleasant 60 year old African  American gentleman with a history of paroxysmal atrial fibrillation and  tachycardia-mediated cardiomyopathy.  He presented in June of this year  after maintaining sinus rhythm over 6 months in atrial fibrillation and  EF of 25% and severe MR.  He was started on amiodarone, metoprolol,  diltiazem, and digoxin to try to restore normal sinus rhythm.  Unfortunately, this did not work.  He continues to manage atrial  fibrillation with a rate of 60-65 beats per minute on Coumadin but in  atrial fibrillation.  He was therefore brought to [facility]  unit for cardioversion to normal sinus rhythm.   After informed consent, the patient was given 100 mg propofol IV for  general anesthesia.  He received 1 time to 150 joules synchronized shock  which restored normal sinus rhythm straightaway immediately.  He has  otherwise done well without any complications.   CONCLUSIONS:  Successful cardioversion to normal sinus rhythm.   PLAN:  He will discontinue his metoprolol, diltiazem, and digoxin.  We  will decrease amiodarone down to 200 mg daily.  He will follow up with  me in 2 weeks.      Ritta Slot, MD  Electronically Signed     HS/MEDQ  D:  11/17/2008  T:  11/17/2008  Job:  [id number]

## 2010-08-03 NOTE — Consult Note (Signed)
[name]               ACCOUNT NO.:  [id number]   MEDICAL RECORD NO.:  [id number]          PATIENT TYPE:  INP   LOCATION:  2003                         FACILITY:  [facility]   PHYSICIAN:  Doralee Albino. Carola Frost, M.D. DATE OF BIRTH:  [birth year]   DATE OF CONSULTATION:  09/23/2008  DATE OF DISCHARGE:                                 CONSULTATION   REASON FOR CONSULTATION:  Left hip pain.   REQUESTING PHYSICIAN:  Antonieta Iba, MD, Cardiology.   BRIEF HISTORY OF PRESENT ILLNESS:  [name] is a very pleasant 43-  year-old African American male who presented to the [facility]  on September 15, 2008, with a primary complaint of shortness of breath and  racing heart rate.  Workup demonstrated rapid atrial fibrillation and  the patient was admitted for appropriate management.  Over the next  several days, the patient was essentially bedridden with minimal  movement at bed and began to develop some left hip pain on Sunday, July  4.  His pain continued to worsen over the next several days, as such  Orthopedics was consulted for evaluation.  Currently, Mr. Seddon is in  the cardiac intensive care unit.  He is resting in chair.  He complains  primarily of lateral left hip and thigh pain.  Denies any numbness or  tingling.  Denies any acute traumatic incident and denies any additional  injuries to his left leg.  The patient states that he has been resting  quite a bit on his left side and feels that excessive pressure has  irritated his leg and causing his pain.  Pain is exacerbated with direct  pressure and lying on it.  He also has pain with ambulation.  He denies  any groin pain.  Pain is relieved somewhat with narcotic medications.  No radiation pain down his leg or into his back as noted.   PAST MEDICAL HISTORY:  1. Significant for AFib with rapid ventricular rate.  2. Questionable obstructive sleep apnea.  3. CHF, systolic dysfunction.  4. Obesity.   FAMILY HISTORY:   Hypertension.   SURGICAL HISTORY:  Tracheostomy back in 2009.   SOCIAL HISTORY:  The patient denies tobacco or use of alcohol products.  The patient is married, lives in [place], 2 children, 2  grandchildren.  The patient does work as a Printmaker.   REVIEW OF SYSTEMS:  As noted above in the HPI.   LABORATORY DATA:  Labs reviewed and included in the chart.  Hip x-rays  none done at this time.   PHYSICAL EXAMINATION:  VITAL SIGNS:  The patient is afebrile.  Heart  rate is in the 130s, is in AFib.  Blood pressure is stable.  GENERAL:  The patient is awake and alert, no acute distress.  Does  appear to be comfortable.  HEENT:  Atraumatic.  Extraocular muscles are intact.  Moist mucous  membranes are noted.  NECK:  Supple.  No lymphadenopathy.  No spinous process tenderness.  LUNGS:  Clear anteriorly with decreased sounds appreciated at the bases.  HEART:  S1 and S2, but irregularly regular.  ABDOMEN:  Soft and nontender.  Positive bowel sounds.  EXTREMITIES:  Focused physical exam of lower extremities.  The patient  demonstrates full range of motion of the right lower extremity.  No  crepitus or blocked motion noted with ranging of the joints.  Distal  motor and sensory functions are intact.  No deep calf tenderness.  Compartments are soft and nontender.  Extremities are warm.  Palpable  dorsalis pedis pulses appreciated.  No instability is appreciated at the  knee, ankle, or hip.  Nontender with palpation of the hip, knee, or  ankle as well.  Examination of the left lower extremity, no gross  deformities were noted.  Examination of the hip, knee, and ankle are  notably unremarkable.  No instability is noted at the hip, knee, or  ankle as well.  No tenderness with axial loading of the hip or log  rolling of the hip.  No groin pain.  No instability is noted at the knee  or ankle as well.  Palpable dorsalis pedis pulses appreciated.  Deep  peroneal nerve, superficial peroneal  nerve, and tibial nerve sensory  functions are intact.  Femoral nerve sensory function is intact.  EHL,  FHL, anterior tibialis, posterior tibialis, peroneals, gastroc-soleus  complex motor function are intact.  The patient is able to demonstrate a  quad contraction.  The patient does have tenderness to palpation  directly over the greater trochanter.  Again, no groin pain and no  proximal femur pain are noted with palpation.  No thigh pain is noted  with palpation.  Soft tissue is nontender to palpation as well.  Again,  no x-rays were performed prior to consultation.  Range of the motion of  the left hip is without crepitus or blocked motion.  No pain with  internal or external rotation is noted.  The patient actually receives  some relief of pain with stretching of the abductors.   ASSESSMENT AND PLAN:  A 59 year old African American male hospitalized  on September 15, 2008, with 3-day history of left hip pain with some  improvement today.  1. 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      a.     Picture favors that of trochanteric bursitis as the patient       has limited mobility and direct pressure of the greater trochanter       causes tenderness.  No significant groin pain or pain with       internal or external rotation is noted.  Pain limits manual muscle       testing with distal asymmetric.      b.     We will obtain plain films of the pelvis and left hip.      c.     Primary method of treatment with bursitis and osteoarthritis       with a strict and structured physical therapy program.  Ice,       stretching, and strengthening, we will get PT consult.      d.     Weightbearing as tolerated on the left lower extremity.      e.     Also nonsteroidal antiinflammatory drug are important to       treatment of inflammatory condition, such as bursitis and       osteoarthritis.  However, given the fact, the patient has been       treated for mild congestive heart  failure, we would not want to  exacerbate fluid retention using nonsteroidal antiinflammatory       drug.      f.     If nonsteroidal antiinflammatory drug are okay with this       patient per primary team standpoint, please let us also know to       initiate therapy.  To limit systemic effects of       antiinflammatories, we will have PT perform iontophoresis if       available to the greater trochanteric region for local therapy.      g.     No range of motion restrictions or precautions.  2. Cardiac issues including rapid atrial fibrillation, congestive      heart failure, and decreased ejection fraction per the primary      team.  3. Disposition.  To work with physical therapy and followup to check      on progress as well as followup on x-ray.  The patient will likely      need outpatient physical therapy upon discharge to continue      structured physical therapy program and to treat trochanteric      bursitis.     Mearl Latin, PA      Doralee Albino. Carola Frost, M.D.  Electronically Signed   KWP/MEDQ  D:  09/26/2008  T:  09/26/2008  Job:  [id number]

## 2010-12-17 LAB — BLOOD GAS, ARTERIAL
Bicarbonate: 32.4 — ABNORMAL HIGH
Drawn by: 22430
FIO2: 0.4
pCO2 arterial: 47.2 — ABNORMAL HIGH
pH, Arterial: 7.451 — ABNORMAL HIGH
pO2, Arterial: 68.7 — ABNORMAL LOW

## 2010-12-17 LAB — BASIC METABOLIC PANEL
BUN: 12
BUN: 12
BUN: 14
BUN: 14
BUN: 15
BUN: 15
BUN: 16
CO2: 28
CO2: 29
CO2: 32
CO2: 36 — ABNORMAL HIGH
CO2: 30
CO2: 32
CO2: 32
Calcium: 8.3 — ABNORMAL LOW
Calcium: 8.6
Calcium: 9
Calcium: 9.3
Calcium: 9.5
Calcium: 8.9
Chloride: 105
Chloride: 92 — ABNORMAL LOW
Chloride: 95 — ABNORMAL LOW
Chloride: 98
Chloride: 100
Chloride: 93 — ABNORMAL LOW
Chloride: 96
Chloride: 96
Chloride: 96
Chloride: 97
Creatinine, Ser: 1.27
Creatinine, Ser: 1.28
Creatinine, Ser: 1.84 — ABNORMAL HIGH
Creatinine, Ser: 1.25
Creatinine, Ser: 1.33
Creatinine, Ser: 1.35
Creatinine, Ser: 1.36
Creatinine, Ser: 1.36
Creatinine, Ser: 1.44
GFR calc Af Amer: 46 — ABNORMAL LOW
GFR calc Af Amer: 57 — ABNORMAL LOW
GFR calc Af Amer: 58 — ABNORMAL LOW
GFR calc Af Amer: >60
GFR calc Af Amer: >60
GFR calc Af Amer: >60
GFR calc Af Amer: >60
GFR calc Af Amer: >60
GFR calc Af Amer: >60
GFR calc non Af Amer: 48 — ABNORMAL LOW
GFR calc non Af Amer: 54 — ABNORMAL LOW
GFR calc non Af Amer: 58 — ABNORMAL LOW
GFR calc non Af Amer: 54 — ABNORMAL LOW
GFR calc non Af Amer: 54 — ABNORMAL LOW
GFR calc non Af Amer: 57 — ABNORMAL LOW
GFR calc non Af Amer: 60 — ABNORMAL LOW
Glucose, Bld: 103 — ABNORMAL HIGH
Glucose, Bld: 105 — ABNORMAL HIGH
Glucose, Bld: 110 — ABNORMAL HIGH
Glucose, Bld: 90
Glucose, Bld: 91
Glucose, Bld: 97
Glucose, Bld: 98
Potassium: 4
Potassium: 4.3
Potassium: 3.7
Potassium: 3.7
Potassium: 3.7
Potassium: 3.8
Potassium: 3.9
Potassium: 4.2
Sodium: 137
Sodium: 137
Sodium: 140
Sodium: 141
Sodium: 133 — ABNORMAL LOW
Sodium: 134 — ABNORMAL LOW

## 2010-12-17 LAB — CBC
HCT: 41.6
HCT: 51.1
HCT: 51.9
HCT: 45
HCT: 45.3
HCT: 46.5
HCT: 47.2
Hemoglobin: 13.8
Hemoglobin: 15.6
Hemoglobin: 13.9
Hemoglobin: 15.3
Hemoglobin: 15.6
MCHC: 32.9
MCHC: 33
MCHC: 33.2
MCHC: 33.8
MCHC: 33
MCHC: 33
MCHC: 33.1
MCHC: 33.3
MCV: 91.7
MCV: 91.8
MCV: 92.3
MCV: 92.4
MCV: 90.7
MCV: 90.8
MCV: 90.9
MCV: 92.2
MCV: 92.4
MCV: 92.6
MCV: 92.7
Platelets: 116 — ABNORMAL LOW
Platelets: 118 — ABNORMAL LOW
Platelets: 125 — ABNORMAL LOW
Platelets: 141 — ABNORMAL LOW
Platelets: 210
Platelets: 211
Platelets: 225
RBC: 4.51
RBC: 4.72
RBC: 4.82
RBC: 5.14
RBC: 5.26
RBC: 5.57
RBC: 5.65
RBC: 4.58
RBC: 4.62
RBC: 5.13
RBC: 5.26
RDW: 14.7
RDW: 14.5
RDW: 14.6
RDW: 14.7
RDW: 14.8
RDW: 15.1
WBC: 5.4
WBC: 5.6
WBC: 7.8
WBC: 8.9
WBC: 5.4
WBC: 5.7
WBC: 8.5
WBC: 9.1
WBC: 9.3

## 2010-12-17 LAB — COMPREHENSIVE METABOLIC PANEL
ALT: 66 — ABNORMAL HIGH
Albumin: 2.9 — ABNORMAL LOW
Albumin: 2.6 — ABNORMAL LOW
Alkaline Phosphatase: 103
BUN: 25 — ABNORMAL HIGH
BUN: 17
Calcium: 8.5
Chloride: 98
Creatinine, Ser: 1.63 — ABNORMAL HIGH
Potassium: 3.8
Potassium: 3.6
Sodium: 135
Total Bilirubin: 0.9
Total Protein: 5 — ABNORMAL LOW
Total Protein: 6.2

## 2010-12-17 LAB — DIFFERENTIAL
Basophils Absolute: 0.1
Basophils Relative: 0
Basophils Relative: 1
Eosinophils Absolute: 0
Eosinophils Relative: 0
Lymphocytes Relative: 31
Monocytes Absolute: 0.9
Monocytes Relative: 10
Neutro Abs: 3.8
Neutrophils Relative %: 57
Neutrophils Relative %: 75

## 2010-12-17 LAB — URINE DRUGS OF ABUSE SCREEN W ALC, ROUTINE (REF LAB)
Barbiturate Quant, Ur: NEGATIVE
Cocaine Metabolites: NEGATIVE
Creatinine,U: 41.7
Methadone: NEGATIVE
Phencyclidine (PCP): NEGATIVE
Propoxyphene: NEGATIVE

## 2010-12-17 LAB — POCT I-STAT, CHEM 8
BUN: 26 — ABNORMAL HIGH
Calcium, Ion: 1.12
Chloride: 106
HCT: 45
Sodium: 139

## 2010-12-17 LAB — URINALYSIS, ROUTINE W REFLEX MICROSCOPIC
Glucose, UA: NEGATIVE
Hgb urine dipstick: NEGATIVE
Ketones, ur: NEGATIVE
pH: 5

## 2010-12-17 LAB — CARDIAC PANEL(CRET KIN+CKTOT+MB+TROPI)
CK, MB: 4.5 — ABNORMAL HIGH
CK, MB: 4.9 — ABNORMAL HIGH
Relative Index: 1.7
Relative Index: 1.8
Relative Index: 1.8
Troponin I: 0.01

## 2010-12-17 LAB — HEPARIN LEVEL (UNFRACTIONATED)
Heparin Unfractionated: 0.1 — ABNORMAL LOW
Heparin Unfractionated: 0.54
Heparin Unfractionated: 0.55
Heparin Unfractionated: 1.07 — ABNORMAL HIGH
Heparin Unfractionated: 1.26 — ABNORMAL HIGH
Heparin Unfractionated: <0.1 — ABNORMAL LOW
Heparin Unfractionated: 0.5
Heparin Unfractionated: 0.53
Heparin Unfractionated: 0.55
Heparin Unfractionated: 0.58

## 2010-12-17 LAB — PROTIME-INR
INR: 1.3
INR: 1.1
Prothrombin Time: 16.4 — ABNORMAL HIGH
Prothrombin Time: 17.9 — ABNORMAL HIGH

## 2010-12-17 LAB — MAGNESIUM
Magnesium: 2.3
Magnesium: 2.2

## 2010-12-17 LAB — APTT: aPTT: 28

## 2010-12-17 LAB — HEMOGLOBIN A1C: Mean Plasma Glucose: 154

## 2010-12-17 LAB — LIPID PANEL: Cholesterol: 139

## 2010-12-17 LAB — B-NATRIURETIC PEPTIDE (CONVERTED LAB)
Pro B Natriuretic peptide (BNP): 1057 — ABNORMAL HIGH
Pro B Natriuretic peptide (BNP): 126 — ABNORMAL HIGH
Pro B Natriuretic peptide (BNP): 655 — ABNORMAL HIGH
Pro B Natriuretic peptide (BNP): 895 — ABNORMAL HIGH
Pro B Natriuretic peptide (BNP): 145 — ABNORMAL HIGH

## 2010-12-17 LAB — POCT CARDIAC MARKERS
Myoglobin, poc: 165
Operator id: 280141
Troponin i, poc: <0.05

## 2010-12-17 LAB — CK TOTAL AND CKMB (NOT AT ARMC): Total CK: 436 — ABNORMAL HIGH

## 2011-07-12 ENCOUNTER — Telehealth: Payer: Self-pay

## 2011-07-12 NOTE — Telephone Encounter (Signed)
LMOM for pt to CB. Please ask pt his PCP so that we can forward report from [facility] Cardiologist to him.

## 2011-07-14 NOTE — Telephone Encounter (Signed)
Pt CB and stated that Dr Cleta Alberts is his PCP. I will put report/chart in Dr Ellis Parents box for review.

## 2011-09-26 ENCOUNTER — Telehealth (HOSPITAL_COMMUNITY): Payer: Self-pay | Admitting: *Deleted

## 2011-09-26 NOTE — Telephone Encounter (Signed)
Instructed to call Kendell Bane for refills from the prescribing physician.

## 2011-09-26 NOTE — Telephone Encounter (Signed)
[name] was d/c'd last week from [facility] and was put on Tikosyn, 0.5 mg.  They only a week supply and he would like to know how he can go about getting more medication.  They use [facility] and [facility]).  Please call him back.

## 2011-11-08 ENCOUNTER — Encounter: Payer: Self-pay | Admitting: Physician Assistant

## 2012-07-07 ENCOUNTER — Encounter: Payer: Self-pay | Admitting: Pharmacist Clinician (PhC)/ Clinical Pharmacy Specialist

## 2012-07-07 DIAGNOSIS — Z7901 Long term (current) use of anticoagulants: Secondary | ICD-10-CM

## 2012-07-07 DIAGNOSIS — I4891 Unspecified atrial fibrillation: Secondary | ICD-10-CM | POA: Insufficient documentation

## 2012-07-18 ENCOUNTER — Encounter: Payer: Self-pay | Admitting: *Deleted

## 2012-07-19 ENCOUNTER — Encounter: Payer: Self-pay | Admitting: Cardiovascular Disease

## 2012-08-14 ENCOUNTER — Ambulatory Visit (INDEPENDENT_AMBULATORY_CARE_PROVIDER_SITE_OTHER): Payer: 59 | Admitting: Pharmacist Clinician (PhC)/ Clinical Pharmacy Specialist

## 2012-08-14 VITALS — BP 100/66 | HR 60

## 2012-08-14 DIAGNOSIS — Z7901 Long term (current) use of anticoagulants: Secondary | ICD-10-CM

## 2012-08-14 DIAGNOSIS — I4891 Unspecified atrial fibrillation: Secondary | ICD-10-CM

## 2012-08-14 LAB — POCT INR: INR: 2.2

## 2012-08-21 ENCOUNTER — Telehealth: Payer: Self-pay | Admitting: Pharmacist Clinician (PhC)/ Clinical Pharmacy Specialist

## 2012-08-21 NOTE — Telephone Encounter (Signed)
Message copied by Rosalee Kaufman on Tue Aug 21, 2012  4:04 PM ------      Message from: Thurmon Fair      Created: Sat Aug 18, 2012  5:02 PM      Regarding: RE: Xarelto switch       Xarelto is a good choice for him            Thurmon Fair, MD, [facility]      [phone] office      [phone] pager            ----- Message -----         From: Phillips Hay, RPH-CPP         Sent: 08/18/2012   2:55 PM           To: Thurmon Fair, MD      Subject: Xarelto switch                                           Mr Cogburn would like to try Xarelto now that his copay for coumadin checks has gone to $45.  He wanted either your or Dr. Smith Robert at [facility] to give your blessing.  He is AF post ablation at [facility].  No valve disease that I am aware of.             ------

## 2012-08-21 NOTE — Telephone Encounter (Signed)
LMOM for pt to call regarding switch to Xarelto

## 2012-08-22 ENCOUNTER — Other Ambulatory Visit: Payer: Self-pay | Admitting: Pharmacist Clinician (PhC)/ Clinical Pharmacy Specialist

## 2012-08-22 MED ORDER — RIVAROXABAN 20 MG PO TABS: 20.0000 mg | ORAL_TABLET | Freq: Every day | ORAL | Status: DC

## 2012-08-22 NOTE — Telephone Encounter (Signed)
Spoke w/ pt today, will switch from warfarin to Xarelto once warfarin supply exhausted.  Pt understands to start Xarelto w/ dinner day after last warfarin dose.

## 2012-09-12 ENCOUNTER — Ambulatory Visit (INDEPENDENT_AMBULATORY_CARE_PROVIDER_SITE_OTHER): Payer: 59 | Admitting: Pharmacist Clinician (PhC)/ Clinical Pharmacy Specialist

## 2012-09-12 VITALS — BP 120/66 | HR 72

## 2012-09-12 DIAGNOSIS — I4891 Unspecified atrial fibrillation: Secondary | ICD-10-CM

## 2012-09-12 DIAGNOSIS — Z7901 Long term (current) use of anticoagulants: Secondary | ICD-10-CM

## 2012-10-08 ENCOUNTER — Other Ambulatory Visit: Payer: Self-pay | Admitting: Cardiovascular Disease

## 2012-10-08 NOTE — Telephone Encounter (Signed)
Furosemide refilled x 1 year

## 2012-10-10 ENCOUNTER — Ambulatory Visit: Payer: 59 | Admitting: Pharmacist Clinician (PhC)/ Clinical Pharmacy Specialist

## 2012-10-22 ENCOUNTER — Other Ambulatory Visit: Payer: Self-pay | Admitting: Cardiovascular Disease

## 2012-10-22 NOTE — Telephone Encounter (Signed)
Rx was sent to pharmacy electronically. 

## 2012-11-28 ENCOUNTER — Encounter: Payer: Self-pay | Admitting: *Deleted

## 2012-12-03 ENCOUNTER — Telehealth: Payer: Self-pay | Admitting: Pharmacist Clinician (PhC)/ Clinical Pharmacy Specialist

## 2012-12-03 NOTE — Telephone Encounter (Signed)
Overdue INR letter sent 

## 2012-12-12 ENCOUNTER — Ambulatory Visit: Payer: Self-pay | Admitting: Pharmacist Clinician (PhC)/ Clinical Pharmacy Specialist

## 2012-12-12 DIAGNOSIS — I4891 Unspecified atrial fibrillation: Secondary | ICD-10-CM

## 2012-12-12 DIAGNOSIS — Z7901 Long term (current) use of anticoagulants: Secondary | ICD-10-CM

## 2013-08-18 ENCOUNTER — Other Ambulatory Visit: Payer: Self-pay | Admitting: Cardiovascular Disease

## 2013-08-26 ENCOUNTER — Encounter: Payer: Self-pay | Admitting: Cardiology

## 2013-08-27 ENCOUNTER — Telehealth: Payer: Self-pay | Admitting: Cardiovascular Disease

## 2013-08-27 NOTE — Telephone Encounter (Signed)
Rx was sent to pharmacy electronically. Last OV 07/2012 

## 2013-08-27 NOTE — Telephone Encounter (Addendum)
08-27-13 08-27-13 sent certified letter regarding past due device check/mt

## 2013-11-16 ENCOUNTER — Other Ambulatory Visit: Payer: Self-pay | Admitting: Cardiovascular Disease

## 2013-11-18 NOTE — Telephone Encounter (Signed)
Rx refill sent to patient pharmacy   

## 2014-12-23 ENCOUNTER — Encounter: Payer: Self-pay | Admitting: Emergency Medicine

## 2017-06-07 ENCOUNTER — Emergency Department (HOSPITAL_COMMUNITY): Payer: 59

## 2017-06-07 ENCOUNTER — Emergency Department (HOSPITAL_COMMUNITY)
Admission: EM | Admit: 2017-06-07 | Discharge: 2017-06-07 | Disposition: A | Discharge status: Home / Self Care | Payer: 59 | Attending: Emergency Medicine | Admitting: Emergency Medicine

## 2017-06-07 ENCOUNTER — Other Ambulatory Visit: Payer: Self-pay

## 2017-06-07 ENCOUNTER — Encounter (HOSPITAL_COMMUNITY): Payer: Self-pay

## 2017-06-07 DIAGNOSIS — Z79899 Other long term (current) drug therapy: Secondary | ICD-10-CM | POA: Diagnosis not present

## 2017-06-07 DIAGNOSIS — I4892 Unspecified atrial flutter: Secondary | ICD-10-CM | POA: Diagnosis not present

## 2017-06-07 DIAGNOSIS — Z7901 Long term (current) use of anticoagulants: Secondary | ICD-10-CM | POA: Diagnosis not present

## 2017-06-07 DIAGNOSIS — R000 Tachycardia, unspecified: Secondary | ICD-10-CM | POA: Diagnosis present

## 2017-06-07 LAB — I-STAT CHEM 8, ED
BUN: 20 mg/dL (ref 6–20)
CHLORIDE: 101 mmol/L (ref 101–111)
CREATININE: 1.4 mg/dL — AB (ref 0.61–1.24)
Calcium, Ion: 1.14 mmol/L — ABNORMAL LOW (ref 1.15–1.40)
GLUCOSE: 111 mg/dL — AB (ref 65–99)
HEMATOCRIT: 39 % (ref 39.0–52.0)
Hemoglobin: 13.3 g/dL (ref 13.0–17.0)
POTASSIUM: 4 mmol/L (ref 3.5–5.1)
Sodium: 140 mmol/L (ref 135–145)
TCO2: 28 mmol/L (ref 22–32)

## 2017-06-07 LAB — CBC
HEMATOCRIT: 39.5 % (ref 39.0–52.0)
HEMOGLOBIN: 12.8 g/dL — AB (ref 13.0–17.0)
MCH: 30.3 pg (ref 26.0–34.0)
MCHC: 32.4 g/dL (ref 30.0–36.0)
MCV: 93.4 fL (ref 78.0–100.0)
Platelets: 130 10*3/uL — ABNORMAL LOW (ref 150–400)
RBC: 4.23 MIL/uL (ref 4.22–5.81)
RDW: 14.3 % (ref 11.5–15.5)
WBC: 4.9 10*3/uL (ref 4.0–10.5)

## 2017-06-07 LAB — BASIC METABOLIC PANEL
ANION GAP: 10 (ref 5–15)
BUN: 16 mg/dL (ref 6–20)
CHLORIDE: 102 mmol/L (ref 101–111)
CO2: 25 mmol/L (ref 22–32)
Calcium: 8.7 mg/dL — ABNORMAL LOW (ref 8.9–10.3)
Creatinine, Ser: 1.44 mg/dL — ABNORMAL HIGH (ref 0.61–1.24)
GFR calc Af Amer: 57 mL/min — ABNORMAL LOW (ref >60)
GFR calc non Af Amer: 49 mL/min — ABNORMAL LOW (ref >60)
GLUCOSE: 114 mg/dL — AB (ref 65–99)
Potassium: 4.1 mmol/L (ref 3.5–5.1)
Sodium: 137 mmol/L (ref 135–145)

## 2017-06-07 LAB — I-STAT TROPONIN, ED: Troponin i, poc: 0.01 ng/mL (ref 0.00–0.08)

## 2017-06-07 MED ORDER — DILTIAZEM HCL 25 MG/5ML IV SOLN
20.0000 mg | Freq: Once | INTRAVENOUS | Status: AC
  Administered 2017-06-07: 20 mg via INTRAVENOUS
  Filled 2017-06-07: qty 5

## 2017-06-07 MED ORDER — ETOMIDATE 2 MG/ML IV SOLN
INTRAVENOUS | Status: AC | PRN
  Administered 2017-06-07: 15 mg via INTRAVENOUS

## 2017-06-07 MED ORDER — ONDANSETRON HCL 4 MG/2ML IJ SOLN
INTRAMUSCULAR | Status: AC
  Filled 2017-06-07: qty 2

## 2017-06-07 MED ORDER — ONDANSETRON HCL 4 MG/2ML IJ SOLN
INTRAMUSCULAR | Status: AC | PRN
  Administered 2017-06-07: 4 mg via INTRAVENOUS

## 2017-06-07 MED ORDER — ETOMIDATE 2 MG/ML IV SOLN
15.0000 mg | Freq: Once | INTRAVENOUS | Status: DC
  Filled 2017-06-07: qty 10

## 2017-06-07 NOTE — ED Provider Notes (Addendum)
[facility] EMERGENCY DEPARTMENT Provider Note   CSN: [id number] Arrival date & time: 06/07/17  1655     History   Chief Complaint Chief Complaint  Patient presents with  . Tachycardia    HPI [name] is a 67 y.o. male.  67 yo M with no complaint but was sent from his doctor's office for tachycardia.  The patient states that he has atrial flutter and that this happens from time to time.  He no longer feels when it starts.  He is on Xarelto for this which he is compliant.  Patient denies chest pain shortness of breath abdominal pain vomiting or diarrhea.  The patient denies decreased oral intake.  He denies worsening of his lower extremity edema.   The history is provided by the patient.  Illness  This is a new problem. The current episode started yesterday. The problem occurs constantly. The problem has not changed since onset.Pertinent negatives include no chest pain, no abdominal pain, no headaches and no shortness of breath. Nothing aggravates the symptoms. Nothing relieves the symptoms. He has tried nothing for the symptoms. The treatment provided no relief.    Past Medical History:  Diagnosis Date  . Atrial tachycardia (HCC)   . Mitral regurgitation   . Paroxysmal atrial flutter [facility])     Patient Active Problem List   Diagnosis Date Noted  . Atrial fibrillation (HCC) 07/07/2012  . Long term (current) use of anticoagulants 07/07/2012    Past Surgical History:  Procedure Laterality Date  . CARDIAC ELECTROPHYSIOLOGY STUDY AND ABLATION     Dr. Riccardo Dubin  . LOOP RECORDER IMPLANT  06/11/2010  . NM MYOVIEW LTD  11/22/2007   no ischemia  . US ECHOCARDIOGRAPHY  01/19/2012   EF =>55%,LA severely dilated, mild to mod. MR, TR       Home Medications    Prior to Admission medications   Medication Sig Start Date End Date Taking? Authorizing Provider  ENTRESTO 24-26 MG Take 1 tablet by mouth 2 (two) times daily. 05/23/17  Yes [provider]  indapamide (LOZOL) 1.25 MG tablet Take 1.25 mg by mouth every morning. 05/18/17  Yes [provider]  metoprolol succinate (TOPROL-XL) 50 MG 24 hr tablet Take 50 mg by mouth daily. 05/23/17  Yes [provider]  tadalafil (CIALIS) 20 MG tablet Take 20 mg by mouth. 05/14/10  Yes [provider]  metoprolol (LOPRESSOR) 50 MG tablet TAKE 1 TABLET TWICE A DAY (DOSAGE DECREASED) Patient not taking: Reported on 06/07/2017 10/22/12   Croitoru, Mihai, MD  Rivaroxaban (XARELTO) 20 MG TABS Take 1 tablet (20 mg total) by mouth daily with supper. Patient not taking: Reported on 06/07/2017 08/22/12   Rosalee Kaufman, RPH-CPP    Family History History reviewed. No pertinent family history.  Social History Social History   Tobacco Use  . Smoking status: Never Smoker  . Smokeless tobacco: Never Used  Substance Use Topics  . Alcohol use: Yes    Comment: rare  . Drug use: Not on file     Allergies   Amiodarone   Review of Systems Review of Systems  Constitutional: Negative for chills and fever.  HENT: Negative for congestion and facial swelling.   Eyes: Negative for discharge and visual disturbance.  Respiratory: Negative for shortness of breath.   Cardiovascular: Negative for chest pain and palpitations.  Gastrointestinal: Negative for abdominal pain, diarrhea and vomiting.  Musculoskeletal: Negative for arthralgias and myalgias.  Skin: Negative for color change  and rash.  Neurological: Negative for tremors, syncope and headaches.  Psychiatric/Behavioral: Negative for confusion and dysphoric mood.     Physical Exam Updated Vital Signs BP 92/73   Pulse 64   Temp 98.4 F (36.9 C) (Oral)   Resp 14   SpO2 98%   Physical Exam  Constitutional: He is oriented to person, place, and time. He appears well-developed and well-nourished.  HENT:  Head: Normocephalic and atraumatic.  Eyes: EOM are normal. Pupils are equal, round, and reactive to light.  Neck: Normal  range of motion. Neck supple. No JVD present.  Cardiovascular: Regular rhythm. Tachycardia present. Exam reveals no gallop and no friction rub.  No murmur heard. Pulmonary/Chest: No respiratory distress. He has no wheezes.  Abdominal: He exhibits no distension and no mass. There is no tenderness. There is no rebound and no guarding.  Musculoskeletal: Normal range of motion. He exhibits edema (3+ to the knees bilaterally).  Neurological: He is alert and oriented to person, place, and time.  Skin: No rash noted. No pallor.  Psychiatric: He has a normal mood and affect. His behavior is normal.  Nursing note and vitals reviewed.    ED Treatments / Results  Labs (all labs ordered are listed, but only abnormal results are displayed) Labs Reviewed  BASIC METABOLIC PANEL - Abnormal; Notable for the following components:      Result Value   Glucose, Bld 114 (*)    Creatinine, Ser 1.44 (*)    Calcium 8.7 (*)    GFR calc non Af Amer 49 (*)    GFR calc Af Amer 57 (*)    All other components within normal limits  CBC - Abnormal; Notable for the following components:   Hemoglobin 12.8 (*)    Platelets 130 (*)    All other components within normal limits  I-STAT CHEM 8, ED - Abnormal; Notable for the following components:   Creatinine, Ser 1.40 (*)    Glucose, Bld 111 (*)    Calcium, Ion 1.14 (*)    All other components within normal limits  I-STAT TROPONIN, ED    EKG  EKG Interpretation  Date/Time:  Wednesday June 07 2017 18:01:03 EDT Ventricular Rate:  91 PR Interval:    QRS Duration: 101 QT Interval:  402 QTC Calculation: 405 R Axis:   101 Text Interpretation:  Sinus rhythm Multiple premature complexes, vent & supraven Right axis deviation Probable anteroseptal infarct, old Abnormal T, consider ischemia, diffuse leads replaced afib Otherwise no significant change Confirmed by Melene Plan [id number]) on 06/07/2017 6:39:13 PM       Radiology Dg Chest Port 1 View  Result Date:  06/07/2017 CLINICAL DATA:  Tachycardia. History of atrial fibrillation hypertension. EXAM: PORTABLE CHEST 1 VIEW COMPARISON:  06/09/2010 FINDINGS: Grossly unchanged enlarged cardiac silhouette and mediastinal contours with calcified left hilar lymph nodes, the sequela of prior granulomatous infection. Epicardial pacer pads overlie the cardiac apex. Mild pulmonary venous congestion without frank evidence of edema. Grossly unchanged minimal bilateral infrahilar opacities favored to represent atelectasis. No new focal airspace opacities. No definite pleural effusion or pneumothorax. No acute osseus abnormalities. IMPRESSION: Cardiomegaly without definitive evidence of edema on this AP portable examination. Electronically Signed   By: Simonne Come M.D.   On: 06/07/2017 17:44    Procedures .Sedation Date/Time: 06/07/2017 6:03 PM Performed by: Melene Plan, DO Authorized by: Melene Plan, DO   Consent:    Consent obtained:  Verbal   Consent given by:  Patient   Risks discussed:  Allergic reaction, dysrhythmia, inadequate sedation, nausea, vomiting, prolonged hypoxia resulting in organ damage and respiratory compromise necessitating ventilatory assistance and intubation Universal protocol:    Immediately prior to procedure a time out was called: yes     Patient identity confirmation method:  Verbally with patient and arm band Indications:    Procedure performed:  Cardioversion   Procedure necessitating sedation performed by:  Physician performing sedation   Intended level of sedation:  Moderate (conscious sedation) Pre-sedation assessment:    Time since last food or drink:  Just prior to arrival    NPO status caution: urgency dictates proceeding with non-ideal NPO status     ASA classification: class 2 - patient with mild systemic disease     Neck mobility: normal     Mouth opening:  3 or more finger widths   Thyromental distance:  2 finger widths   Mallampati score:  I - soft palate, uvula, fauces,  pillars visible   Pre-sedation assessments completed and reviewed: airway patency, cardiovascular function, hydration status, mental status, nausea/vomiting, pain level, respiratory function and temperature   Immediate pre-procedure details:    Reviewed: vital signs     Verified: bag valve mask available, emergency equipment available, intubation equipment available, IV patency confirmed, oxygen available and suction available   Procedure details (see MAR for exact dosages):    Preoxygenation:  Nasal cannula   Sedation:  Etomidate   Intra-procedure monitoring:  Blood pressure monitoring, cardiac monitor, frequent vital sign checks, continuous pulse oximetry and frequent LOC assessments   Intra-procedure events: emesis     Intra-procedure management:  Airway suctioning   Total Provider sedation time (minutes):  35 Post-procedure details:    Attendance: Constant attendance by certified staff until patient recovered     Recovery: Patient returned to pre-procedure baseline     Post-sedation assessments completed and reviewed: airway patency, cardiovascular function, hydration status, mental status, nausea/vomiting, pain level, respiratory function and temperature     Patient tolerance:  Tolerated well, no immediate complications .Cardioversion Date/Time: 06/07/2017 6:05 PM Performed by: Melene Plan, DO Authorized by: Melene Plan, DO   Consent:    Consent obtained:  Verbal   Consent given by:  Patient   Risks discussed:  Cutaneous burn and death   Alternatives discussed:  Rate-control medication, alternative treatment and observation Pre-procedure details:    Rhythm:  Atrial flutter   Electrode placement:  Anterior-posterior Patient sedated: Yes. Refer to sedation procedure documentation for details of sedation.  Attempt one:    Cardioversion mode:  Synchronous   Waveform:  Biphasic   Shock (Joules):  100   Shock outcome:  Conversion to normal sinus rhythm Post-procedure details:     Patient status:  Awake   Patient tolerance of procedure:  Tolerated well, no immediate complications   (including critical care time)  Medications Ordered in ED Medications  etomidate (AMIDATE) injection 15 mg (not administered)  ondansetron (ZOFRAN) 4 MG/2ML injection (not administered)  diltiazem (CARDIZEM) injection 20 mg (20 mg Intravenous Given 06/07/17 1741)  etomidate (AMIDATE) injection (15 mg Intravenous Given 06/07/17 1853)  ondansetron (ZOFRAN) injection (4 mg Intravenous Given 06/07/17 1805)     Initial Impression / Assessment and Plan / ED Course  I have reviewed the triage vital signs and the nursing notes.  Pertinent labs & imaging results that were available during my care of the patient were reviewed by me and considered in my medical decision making (see chart for details).     67 yo M with  a chief complaint of tachycardia.  The patient did not know when this started went to a normal follow-up visit at his physician's office and was noted to be tachycardic.  They then sent him to the emergency department for further evaluation.  He is well-appearing and nontoxic.  He has lower extremity edema which is at his baseline per him.  EKG is consistent with atrial flutter with 2-1 block.  I discussed with the patient risks and benefits of electrical cardioversion as the patient is on Xarelto and has been compliant.  The patient agrees with the procedure.  The patient was given a dose of diltiazem which confirmed that he was in atrial flutter.  The patient had a transient slowing down to 3-1 block and then had an increase back into 2-1 block.  The patient was cardioverted.  Now in normal sinus rhythm with frequent PVCs.  Patient had another emesis episode, given another dose of zofran.    CHA2DS2/VAS Stroke Risk Points  Current as of about an hour ago     2 >= 2 Points: High Risk  1 - 1.99 Points: Medium Risk  0 Points: Low Risk    The patient's score has not changed in the past  year.:  No Change     Details    This score determines the patient's risk of having a stroke if the  patient has atrial fibrillation.       Points Metrics  0 Has Congestive Heart Failure:  No    Current as of about an hour ago  0 Has Vascular Disease:  No    Current as of about an hour ago  1 Has Hypertension:  Yes     Current as of about an hour ago  1 Age:  3    Current as of about an hour ago  0 Has Diabetes:  No    Current as of about an hour ago  0 Had Stroke:  No  Had TIA:  No  Had thromboembolism:  No    Current as of about an hour ago  0 Male:  No    Current as of about an hour ago            Patient ambulating without difficulty.  D/c home.   CRITICAL CARE Performed by: Rae Roam   Total critical care time: 35 minutes  Critical care time was exclusive of separately billable procedures and treating other patients.  Critical care was necessary to treat or prevent imminent or life-threatening deterioration.  Critical care was time spent personally by me on the following activities: development of treatment plan with patient and/or surrogate as well as nursing, discussions with consultants, evaluation of patient's response to treatment, examination of patient, obtaining history from patient or surrogate, ordering and performing treatments and interventions, ordering and review of laboratory studies, ordering and review of radiographic studies, pulse oximetry and re-evaluation of patient's condition.   10:54 PM:  I have discussed the diagnosis/risks/treatment options with the patient and family and believe the pt to be eligible for discharge home to follow-up with PCP. We also discussed returning to the ED immediately if new or worsening sx occur. We discussed the sx which are most concerning (e.g., sudden worsening pain, fever, inability to tolerate by mouth) that necessitate immediate return. Medications administered to the patient during their visit and  any new prescriptions provided to the patient are listed below.  Medications given during this visit Medications  etomidate (AMIDATE) injection  15 mg (not administered)  ondansetron (ZOFRAN) 4 MG/2ML injection (not administered)  diltiazem (CARDIZEM) injection 20 mg (20 mg Intravenous Given 06/07/17 1741)  etomidate (AMIDATE) injection (15 mg Intravenous Given 06/07/17 1853)  ondansetron (ZOFRAN) injection (4 mg Intravenous Given 06/07/17 1805)     The patient appears reasonably screen and/or stabilized for discharge and I doubt any other medical condition or other [facility] requiring further screening, evaluation, or treatment in the ED at this time prior to discharge.    Final Clinical Impressions(s) / ED Diagnoses   Final diagnoses:  Atrial flutter with rapid ventricular response [facility])    ED Discharge Orders    None       Melene Plan, DO 06/07/17 2255    Melene Plan, DO 06/08/17 [phone]

## 2017-06-07 NOTE — ED Triage Notes (Signed)
Pt coming from PCP office when they discovered he was in SVT. Denies chest pain or any associated symptoms. Pt alert and oriented, skin warm and dry,

## 2017-06-13 ENCOUNTER — Telehealth (HOSPITAL_COMMUNITY): Payer: Self-pay | Admitting: *Deleted

## 2017-06-13 NOTE — Telephone Encounter (Signed)
Pt called and canceled afib clinic follow up appointment from ER scheduled for 3/27 - did not want to reschedule at this time.

## 2017-06-14 ENCOUNTER — Ambulatory Visit (HOSPITAL_COMMUNITY): Payer: 59 | Admitting: Nurse Practitioner

## 2017-08-29 ENCOUNTER — Ambulatory Visit (HOSPITAL_COMMUNITY)
Admission: RE | Admit: 2017-08-29 | Discharge: 2017-08-29 | Disposition: A | Discharge status: Home / Self Care | Payer: 59 | Source: Ambulatory Visit | Attending: Nurse Practitioner | Admitting: Nurse Practitioner

## 2017-08-29 ENCOUNTER — Encounter (HOSPITAL_COMMUNITY): Payer: Self-pay | Admitting: Nurse Practitioner

## 2017-08-29 VITALS — BP 130/96 | HR 147 | Ht 76.0 in | Wt 315.4 lb

## 2017-08-29 DIAGNOSIS — I5023 Acute on chronic systolic (congestive) heart failure: Secondary | ICD-10-CM | POA: Diagnosis not present

## 2017-08-29 DIAGNOSIS — I340 Nonrheumatic mitral (valve) insufficiency: Secondary | ICD-10-CM | POA: Insufficient documentation

## 2017-08-29 DIAGNOSIS — E669 Obesity, unspecified: Secondary | ICD-10-CM | POA: Diagnosis not present

## 2017-08-29 DIAGNOSIS — I110 Hypertensive heart disease with heart failure: Secondary | ICD-10-CM | POA: Diagnosis not present

## 2017-08-29 DIAGNOSIS — I481 Persistent atrial fibrillation: Secondary | ICD-10-CM | POA: Insufficient documentation

## 2017-08-29 DIAGNOSIS — Z79899 Other long term (current) drug therapy: Secondary | ICD-10-CM | POA: Insufficient documentation

## 2017-08-29 DIAGNOSIS — Z7901 Long term (current) use of anticoagulants: Secondary | ICD-10-CM | POA: Insufficient documentation

## 2017-08-29 DIAGNOSIS — I10 Essential (primary) hypertension: Secondary | ICD-10-CM | POA: Diagnosis not present

## 2017-08-29 DIAGNOSIS — Z6838 Body mass index (BMI) 38.0-38.9, adult: Secondary | ICD-10-CM | POA: Diagnosis not present

## 2017-08-29 DIAGNOSIS — I4892 Unspecified atrial flutter: Secondary | ICD-10-CM | POA: Insufficient documentation

## 2017-08-29 DIAGNOSIS — I429 Cardiomyopathy, unspecified: Secondary | ICD-10-CM | POA: Diagnosis not present

## 2017-08-29 DIAGNOSIS — I484 Atypical atrial flutter: Secondary | ICD-10-CM | POA: Diagnosis not present

## 2017-08-29 DIAGNOSIS — I4819 Other persistent atrial fibrillation: Secondary | ICD-10-CM

## 2017-08-29 HISTORY — DX: Essential (primary) hypertension: I10

## 2017-08-29 HISTORY — DX: Other persistent atrial fibrillation: I48.19

## 2017-08-29 HISTORY — DX: Cardiomyopathy, unspecified: I42.9

## 2017-08-29 LAB — CBC
HCT: 40.1 % (ref 39.0–52.0)
HEMOGLOBIN: 12.7 g/dL — AB (ref 13.0–17.0)
MCH: 30 pg (ref 26.0–34.0)
MCHC: 31.7 g/dL (ref 30.0–36.0)
MCV: 94.8 fL (ref 78.0–100.0)
Platelets: 143 10*3/uL — ABNORMAL LOW (ref 150–400)
RBC: 4.23 MIL/uL (ref 4.22–5.81)
RDW: 15.9 % — ABNORMAL HIGH (ref 11.5–15.5)
WBC: 4.9 10*3/uL (ref 4.0–10.5)

## 2017-08-29 LAB — BASIC METABOLIC PANEL
Anion gap: 6 (ref 5–15)
BUN: 12 mg/dL (ref 6–20)
CHLORIDE: 109 mmol/L (ref 101–111)
CO2: 27 mmol/L (ref 22–32)
Calcium: 8.9 mg/dL (ref 8.9–10.3)
Creatinine, Ser: 1.27 mg/dL — ABNORMAL HIGH (ref 0.61–1.24)
GFR calc Af Amer: >60 mL/min (ref >60)
GFR, EST NON AFRICAN AMERICAN: 57 mL/min — AB (ref >60)
Glucose, Bld: 108 mg/dL — ABNORMAL HIGH (ref 65–99)
POTASSIUM: 4.2 mmol/L (ref 3.5–5.1)
SODIUM: 142 mmol/L (ref 135–145)

## 2017-08-29 LAB — MAGNESIUM: Magnesium: 2 mg/dL (ref 1.7–2.4)

## 2017-08-29 LAB — TSH: TSH: 0.951 u[IU]/mL (ref 0.350–4.500)

## 2017-08-29 MED ORDER — FUROSEMIDE 80 MG PO TABS: 80.0000 mg | ORAL_TABLET | Freq: Two times a day (BID) | ORAL | 3 refills | Status: DC

## 2017-08-29 MED ORDER — RIVAROXABAN 20 MG PO TABS: 20.0000 mg | ORAL_TABLET | Freq: Every day | ORAL | 6 refills | Status: DC

## 2017-08-29 MED ORDER — METOPROLOL TARTRATE 50 MG PO TABS: 50.0000 mg | ORAL_TABLET | Freq: Two times a day (BID) | ORAL | 3 refills | Status: DC

## 2017-08-29 NOTE — Patient Instructions (Signed)
Start 1)Lasix 80mg  twice a day 2)Metoprolol 50mg  twice a day 3)Xarelto 20mg  once a day with supper

## 2017-08-30 ENCOUNTER — Encounter (HOSPITAL_COMMUNITY): Payer: Self-pay | Admitting: Nurse Practitioner

## 2017-08-30 NOTE — Progress Notes (Signed)
Primary Care Physician: Renaye Rakers, MD Primary Cardiologist: previously [facility] Primary Electrophysiologist: previously Hurman Horn [facility]) Referring Physician: ER/self   [name] is a 67 y.o. male with a history of persistent atrial fibrillation status post convergent ablation at [facility] with Dr Hurman Horn in 2012 who presents for consultation in the [facility]. He was initially diagnosed with atrial fibrillation about 9 years ago and was followed by [facility] at that time.  He was referred to [facility] for consideration of ablation and underwent a convergent AF ablation by Dr Hurman Horn. Post op, he had pericardial effusion which required draining.  He also has previously had cardiomyopathy and was on Entresto, Metoprolol, Lasix, and Xarelto.  He has been followed by Dr Parke Simmers.  In March of this year, he presented to the ER with worsening shortness of breath and fatigue. He was found to be in atrial flutter and cardioverted. He was scheduled for follow up in the AF clinic but cancelled that appointment because he was feeling better.  A couple of months ago he ran out of all of his medications.  Since that time, he has had weight gain, LE edema, shortness of breath, and fatigue. He called requesting to be seen in follow up.     Atrial Fibrillation Risk Factors:  he does not have symptoms or diagnosis of sleep apnea.  he does not have a history of rheumatic fever.  he does not have a history of alcohol use.  he has a BMI of Body mass index is 38.39 kg/m.Marland Kitchen Filed Weights   08/29/17 0843  Weight: (!) 315 lb 6.4 oz (143.1 kg)     Atrial Fibrillation Management history:  Previous antiarrhythmic drugs: Tikosyn - 2013, stopped at unknown time - pt does not remember when it was discontinued. Allergy to amiodarone listed, pt unsure of reaction  Previous ablations: 2012 - Dr Hurman Horn, [facility], convergent procedure  [facility] score: 3  Anticoagulation history:  Warfarin ->Xarelto 2014, now off OAC for 2 months    Past Medical History:  Diagnosis Date  . Atrial tachycardia (HCC)   . Cardiomyopathy (HCC)   . Hypertension   . Mitral regurgitation   . Paroxysmal atrial flutter (HCC)   . Persistent atrial fibrillation [facility])    Past Surgical History:  Procedure Laterality Date  . CARDIAC ELECTROPHYSIOLOGY STUDY AND ABLATION  2012   Dr. Riccardo Dubin  . LOOP RECORDER IMPLANT  06/11/2010    Current Outpatient Medications  Medication Sig Dispense Refill  . furosemide (LASIX) 80 MG tablet Take 1 tablet (80 mg total) by mouth 2 (two) times daily. 60 tablet 3  . metoprolol tartrate (LOPRESSOR) 50 MG tablet Take 1 tablet (50 mg total) by mouth 2 (two) times daily. 60 tablet 3  . rivaroxaban (XARELTO) 20 MG TABS tablet Take 1 tablet (20 mg total) by mouth daily with supper. 30 tablet 6   No current facility-administered medications for this encounter.     Allergies  Allergen Reactions  . Amiodarone     Social History   Socioeconomic History  . Marital status: Widowed    Spouse name: Not on file  . Number of children: Not on file  . Years of education: Not on file  . Highest education level: Not on file  Occupational History  . Not on file  Social Needs  . Financial resource strain: Not on file  . Food insecurity:    Worry: Not on file    Inability: Not on file  .  Transportation needs:    Medical: Not on file    Non-medical: Not on file  Tobacco Use  . Smoking status: Never Smoker  . Smokeless tobacco: Never Used  Substance and Sexual Activity  . Alcohol use: Yes    Comment: rare  . Drug use: Not on file  . Sexual activity: Not on file  Lifestyle  . Physical activity:    Days per week: Not on file    Minutes per session: Not on file  . Stress: Not on file  Relationships  . Social connections:    Talks on phone: Not on file    Gets together: Not on file    Attends religious service: Not on file    Active member of club or  organization: Not on file    Attends meetings of clubs or organizations: Not on file    Relationship status: Not on file  . Intimate partner violence:    Fear of current or ex partner: Not on file    Emotionally abused: Not on file    Physically abused: Not on file    Forced sexual activity: Not on file  Other Topics Concern  . Not on file  Social History Narrative  . Not on file   ROS- All systems are reviewed and negative except as per the HPI above.  Physical Exam: Vitals:   08/29/17 0843  BP: (!) 130/96  Pulse: (!) 147  Weight: (!) 315 lb 6.4 oz (143.1 kg)  Height: 6\' 4"  (1.93 m)    GEN- The patient is well appearing, alert and oriented x 3 today.   Head- normocephalic, atraumatic Eyes-  Sclera clear, conjunctiva pink Ears- hearing intact Oropharynx- clear Neck- supple  Lungs- Clear to ausculation bilaterally, normal work of breathing Heart- Tachycardic regular rate and rhythm  GI- soft, NT, ND, + BS Extremities- no clubbing, cyanosis, 2+ pitting edema to knees MS- no significant deformity or atrophy Skin- no rash or lesion Psych- euthymic mood, full affect Neuro- strength and sensation are intact  Wt Readings from Last 3 Encounters:  08/29/17 (!) 315 lb 6.4 oz (143.1 kg)    EKG today demonstrates atypical atrial flutter, rate 147  Epic records are reviewed at length today  Assessment and Plan:  1. Persistent atrial fibrillation/atypical flutter S/p ablation with Dr Hurman Horn [address] - I do not have these procedure records to review He has had recurrent atrial flutter requiring cardioversion in March of this year He has been off all meds for the last 2 months and is now in recurrent flutter with RVR with acute on chronic systolic heart failure For now, will restart Xarelto 20mg  daily, Metoprolol for rate control, and lasix 80mg  twice daily He will need consideration for TEE guided DCCV next week, but need to be sure he is back on meds first and  tolerating Consideration can be given for ablation once stabilized depending on echo results.  He has an allergy to amiodarone but apparently tolerated Tikosyn in the past.  CHADS2VASC is at least 3  2.  Acute on chronic systolic heart failure He thinks dry weight is around 280 pounds He has significant fluid overload today on exam Start Lasix 80mg  twice daily today, follow up on Thursday. Will need to closely follow K+ I did not resume Entresto today - this can be restarted Thursday depending on BP and BMET Update echo (none since 2013) Low sodium diet reviewed  3.  HTN BP elevated today Resume Lasix and metoprolol  as above Follow up Thursday  4.  Obesity Body mass index is 38.39 kg/m. Weight loss encouraged   I considered admission for diuresis, but he has blood pressure and looks fairly well today considering how much fluid he has on board. I think we can manage this as an outpatient, although he will need consideration for TEE/DCCV next week after back on meds.  ER precautions reviewed. No work this week.   Follow up with Dr Elberta Fortis on Thursday  Gypsy Balsam, NP 08/30/2017 8:04 AM

## 2017-08-31 ENCOUNTER — Encounter (HOSPITAL_COMMUNITY): Payer: Self-pay | Admitting: Nurse Practitioner

## 2017-08-31 ENCOUNTER — Ambulatory Visit (HOSPITAL_COMMUNITY)
Admission: RE | Admit: 2017-08-31 | Discharge: 2017-08-31 | Disposition: A | Discharge status: Home / Self Care | Payer: 59 | Source: Ambulatory Visit | Attending: Nurse Practitioner | Admitting: Nurse Practitioner

## 2017-08-31 VITALS — BP 112/74 | HR 147 | Ht 76.0 in | Wt 300.8 lb

## 2017-08-31 DIAGNOSIS — Z7901 Long term (current) use of anticoagulants: Secondary | ICD-10-CM | POA: Diagnosis not present

## 2017-08-31 DIAGNOSIS — I483 Typical atrial flutter: Secondary | ICD-10-CM | POA: Diagnosis not present

## 2017-08-31 DIAGNOSIS — I4892 Unspecified atrial flutter: Secondary | ICD-10-CM | POA: Diagnosis not present

## 2017-08-31 DIAGNOSIS — E669 Obesity, unspecified: Secondary | ICD-10-CM | POA: Diagnosis not present

## 2017-08-31 DIAGNOSIS — I509 Heart failure, unspecified: Secondary | ICD-10-CM

## 2017-08-31 DIAGNOSIS — Z6836 Body mass index (BMI) 36.0-36.9, adult: Secondary | ICD-10-CM | POA: Insufficient documentation

## 2017-08-31 DIAGNOSIS — I340 Nonrheumatic mitral (valve) insufficiency: Secondary | ICD-10-CM | POA: Insufficient documentation

## 2017-08-31 DIAGNOSIS — I110 Hypertensive heart disease with heart failure: Secondary | ICD-10-CM | POA: Insufficient documentation

## 2017-08-31 DIAGNOSIS — I429 Cardiomyopathy, unspecified: Secondary | ICD-10-CM | POA: Insufficient documentation

## 2017-08-31 DIAGNOSIS — I481 Persistent atrial fibrillation: Secondary | ICD-10-CM | POA: Diagnosis present

## 2017-08-31 DIAGNOSIS — Z79899 Other long term (current) drug therapy: Secondary | ICD-10-CM | POA: Diagnosis not present

## 2017-08-31 DIAGNOSIS — I4819 Other persistent atrial fibrillation: Secondary | ICD-10-CM

## 2017-08-31 DIAGNOSIS — I5023 Acute on chronic systolic (congestive) heart failure: Secondary | ICD-10-CM | POA: Insufficient documentation

## 2017-08-31 LAB — BASIC METABOLIC PANEL
ANION GAP: 9 (ref 5–15)
BUN: 13 mg/dL (ref 6–20)
CO2: 32 mmol/L (ref 22–32)
Calcium: 8.8 mg/dL — ABNORMAL LOW (ref 8.9–10.3)
Chloride: 100 mmol/L — ABNORMAL LOW (ref 101–111)
Creatinine, Ser: 1.45 mg/dL — ABNORMAL HIGH (ref 0.61–1.24)
GFR calc Af Amer: 56 mL/min — ABNORMAL LOW (ref >60)
GFR, EST NON AFRICAN AMERICAN: 49 mL/min — AB (ref >60)
GLUCOSE: 103 mg/dL — AB (ref 65–99)
Potassium: 3.9 mmol/L (ref 3.5–5.1)
Sodium: 141 mmol/L (ref 135–145)

## 2017-08-31 NOTE — Progress Notes (Signed)
Primary Care Physician: Renaye Rakers, MD Primary Cardiologist: previously [facility]) Primary Electrophysiologist: previously Hurman Horn [facility]) Referring Physician: ER/self   [name] is a 67 y.o. male with a history of persistent atrial fibrillation status post convergent ablation at [facility] with Dr Hurman Horn in 2012 who presents for consultation in the [facility] Fibr[facility]. He was initially diagnosed with atrial fibrillation about 9 years ago and was followed by [facility] at that time.  He was referred to [facility] for consideration of ablation and underwent a convergent AF ablation by Dr Hurman Horn. Post op, he had pericardial effusion which required draining.  He also has previously had cardiomyopathy and was on Entresto, Metoprolol, Lasix, and Xarelto.  He has been followed by Dr Parke Simmers.  In March of this year, he presented to the ER with worsening shortness of breath and fatigue. He was found to be in atrial flutter and cardioverted. He was scheduled for follow up in the AF clinic but cancelled that appointment because he was feeling better.  A couple of months ago he ran out of all of his medications.  Since that time, he has had weight gain, LE edema, shortness of breath, and fatigue. He called requesting to be seen in follow up.     Atrial Fibrillation Risk Factors:  he does not have symptoms or diagnosis of sleep apnea.  he does not have a history of rheumatic fever.  he does not have a history of alcohol use.  he has a BMI of Body mass index is 36.61 kg/m.Marland Kitchen Filed Weights   08/31/17 1534  Weight: (!) 300 lb 12.8 oz (136.4 kg)     Atrial Fibrillation Management history:  Previous antiarrhythmic drugs: Tikosyn - 2013, stopped at unknown time - pt does not remember when it was discontinued. Allergy to amiodarone listed, pt unsure of reaction  Previous ablations: 2012 - Dr Hurman Horn, [facility], convergent procedure  [facility] score: 3  Anticoagulation history:  Warfarin ->Xarelto 2014, now off OAC for 2 months   Today, denies symptoms of palpitations, chest pain, shortness of breath, orthopnea, PND, lower extremity edema, claudication, dizziness, presyncope, syncope, bleeding, or neurologic sequela. The patient is tolerating medications without difficulties.  He is feeling much better since last being seen.  His heart rate still remains elevated and he does have palpitations.  He has lost 15 pounds since being diuresed.  His respiratory status is improved since diuresis.   Past Medical History:  Diagnosis Date  . Atrial tachycardia (HCC)   . Cardiomyopathy (HCC)   . Hypertension   . Mitral regurgitation   . Paroxysmal atrial flutter (HCC)   . Persistent atrial fibrillation [facility])    Past Surgical History:  Procedure Laterality Date  . CARDIAC ELECTROPHYSIOLOGY STUDY AND ABLATION  2012   Dr. Riccardo Dubin  . LOOP RECORDER IMPLANT  06/11/2010    Current Outpatient Medications  Medication Sig Dispense Refill  . furosemide (LASIX) 80 MG tablet Take 1 tablet (80 mg total) by mouth 2 (two) times daily. 60 tablet 3  . metoprolol tartrate (LOPRESSOR) 50 MG tablet Take 1 tablet (50 mg total) by mouth 2 (two) times daily. 60 tablet 3  . rivaroxaban (XARELTO) 20 MG TABS tablet Take 1 tablet (20 mg total) by mouth daily with supper. (Patient not taking: Reported on 08/31/2017) 30 tablet 6   No current facility-administered medications for this encounter.     Allergies  Allergen Reactions  . Amiodarone     Social History   Socioeconomic  History  . Marital status: Widowed    Spouse name: Not on file  . Number of children: Not on file  . Years of education: Not on file  . Highest education level: Not on file  Occupational History  . Not on file  Social Needs  . Financial resource strain: Not on file  . Food insecurity:    Worry: Not on file    Inability: Not on file  . Transportation needs:    Medical: Not on file    Non-medical: Not on file   Tobacco Use  . Smoking status: Never Smoker  . Smokeless tobacco: Never Used  Substance and Sexual Activity  . Alcohol use: Yes    Comment: rare  . Drug use: Not on file  . Sexual activity: Not on file  Lifestyle  . Physical activity:    Days per week: Not on file    Minutes per session: Not on file  . Stress: Not on file  Relationships  . Social connections:    Talks on phone: Not on file    Gets together: Not on file    Attends religious service: Not on file    Active member of club or organization: Not on file    Attends meetings of clubs or organizations: Not on file    Relationship status: Not on file  . Intimate partner violence:    Fear of current or ex partner: Not on file    Emotionally abused: Not on file    Physically abused: Not on file    Forced sexual activity: Not on file  Other Topics Concern  . Not on file  Social History Narrative  . Not on file   ROS:  Please see the history of present illness.   Otherwise, review of systems is positive for none.   All other systems are reviewed and negative.   PHYSICAL EXAM: VS:  BP 112/74 (BP Location: Left Arm, Patient Position: Sitting, Cuff Size: Large)   Pulse (!) 147   Ht 6\' 4"  (1.93 m)   Wt (!) 300 lb 12.8 oz (136.4 kg)   BMI 36.61 kg/m  , BMI Body mass index is 36.61 kg/m. GEN: Well nourished, well developed, in no acute distress  HEENT: normal  Neck: no JVD, carotid bruits, or masses Cardiac: Regular, tachycardic ; no murmurs, rubs, or gallops,no edema  Respiratory:  clear to auscultation bilaterally, normal work of breathing GI: soft, nontender, nondistended, + BS MS: no deformity or atrophy  Skin: warm and dry Neuro:  Strength and sensation are intact Psych: euthymic mood, full affect  EKG:  EKG is ordered today. Personal review of the ekg ordered shows atrial flutter, rate 147   Epic records reviewed today  Assessment and Plan:  1. Persistent atrial fibrillation/atypical flutter Status post  ablation in [place] in 2012. Has recurrent atrial flutter requiring cardioversion in March of this year. Has been off of his medications for the last 2 months and now is recurrent atrial flutter with rapid rates and acute on chronic systolic heart failure. Has been put on Xarelto, but has not yet started it due to insurance issues.  We was given to him a week's worth and will work with his insurance company to make this more affordable. He is quite volume overloaded today, though he has lost 15 pounds.  I do not think that he would stay in rhythm if we cardioverted him next week.  He needs further diuresis.  Depending on echo  results, he may be an ablation candidate.   S/p ablation with Dr Hurman Horn [address] - I do not have these procedure records to review He has had recurrent atrial flutter requiring cardioversion in March of this year He has been off all meds for the last 2 months and is now in recurrent flutter with RVR with acute on chronic systolic heart failure For now, will restart Xarelto 20mg  daily, Metoprolol for rate control, and lasix 80mg  twice daily He will need consideration for TEE guided DCCV next week, but need to be sure he is back on meds first and tolerating Consideration can be given for ablation once stabilized depending on echo results.  He has an allergy to amiodarone but apparently tolerated Tikosyn in the past. CHADS2VASC is at least 3  This patients CHA2DS2-VASc Score and unadjusted Ischemic Stroke Rate (% per year) is equal to 3.2 % stroke rate/year from a score of 3  Above score calculated as 1 point each if present [CHF, HTN, DM, Vascular=MI/PAD/Aortic Plaque, Age if 65-74, or Male] Above score calculated as 2 points each if present [Age > 75, or Stroke/TIA/TE]  2.  Acute on chronic systolic heart failure Thinks his dry weight is around 280 pounds.  He initially presented 2 days ago at 315 pounds.  Now he is down to 300 pounds.  We will continue  diuresis until he gets down to his dry weight.  He will call us once he gets to that level.  3.  HTN Blood pressure well controlled today.  No changes.  4.  Obesity Body mass index is 36.61 kg/m. weight loss encouraged  Will Jorja Loa, MD 08/31/2017 3:43 PM

## 2017-09-06 ENCOUNTER — Ambulatory Visit (HOSPITAL_COMMUNITY)
Admission: RE | Admit: 2017-09-06 | Discharge: 2017-09-06 | Disposition: A | Discharge status: Home / Self Care | Payer: 59 | Source: Ambulatory Visit | Attending: Nurse Practitioner | Admitting: Nurse Practitioner

## 2017-09-06 ENCOUNTER — Encounter (HOSPITAL_COMMUNITY): Payer: Self-pay

## 2017-09-06 ENCOUNTER — Other Ambulatory Visit: Payer: Self-pay

## 2017-09-06 ENCOUNTER — Encounter (HOSPITAL_COMMUNITY): Payer: Self-pay | Admitting: Nurse Practitioner

## 2017-09-06 ENCOUNTER — Ambulatory Visit (HOSPITAL_BASED_OUTPATIENT_CLINIC_OR_DEPARTMENT_OTHER)
Admission: RE | Admit: 2017-09-06 | Discharge: 2017-09-06 | Disposition: A | Discharge status: Home / Self Care | Payer: 59 | Source: Ambulatory Visit | Attending: Nurse Practitioner | Admitting: Nurse Practitioner

## 2017-09-06 VITALS — BP 108/68 | HR 139 | Ht 76.0 in | Wt 283.2 lb

## 2017-09-06 DIAGNOSIS — I10 Essential (primary) hypertension: Secondary | ICD-10-CM | POA: Diagnosis not present

## 2017-09-06 DIAGNOSIS — Z79899 Other long term (current) drug therapy: Secondary | ICD-10-CM | POA: Insufficient documentation

## 2017-09-06 DIAGNOSIS — I5023 Acute on chronic systolic (congestive) heart failure: Secondary | ICD-10-CM

## 2017-09-06 DIAGNOSIS — I481 Persistent atrial fibrillation: Secondary | ICD-10-CM | POA: Diagnosis not present

## 2017-09-06 DIAGNOSIS — I484 Atypical atrial flutter: Secondary | ICD-10-CM | POA: Diagnosis not present

## 2017-09-06 DIAGNOSIS — Z7901 Long term (current) use of anticoagulants: Secondary | ICD-10-CM | POA: Insufficient documentation

## 2017-09-06 DIAGNOSIS — I340 Nonrheumatic mitral (valve) insufficiency: Secondary | ICD-10-CM | POA: Insufficient documentation

## 2017-09-06 DIAGNOSIS — Z01810 Encounter for preprocedural cardiovascular examination: Secondary | ICD-10-CM | POA: Diagnosis present

## 2017-09-06 DIAGNOSIS — Z01812 Encounter for preprocedural laboratory examination: Secondary | ICD-10-CM | POA: Insufficient documentation

## 2017-09-06 DIAGNOSIS — I429 Cardiomyopathy, unspecified: Secondary | ICD-10-CM | POA: Diagnosis not present

## 2017-09-06 DIAGNOSIS — I4819 Other persistent atrial fibrillation: Secondary | ICD-10-CM

## 2017-09-06 DIAGNOSIS — I110 Hypertensive heart disease with heart failure: Secondary | ICD-10-CM | POA: Diagnosis not present

## 2017-09-06 DIAGNOSIS — R600 Localized edema: Secondary | ICD-10-CM | POA: Diagnosis not present

## 2017-09-06 DIAGNOSIS — I454 Nonspecific intraventricular block: Secondary | ICD-10-CM | POA: Diagnosis not present

## 2017-09-06 LAB — BASIC METABOLIC PANEL
ANION GAP: 9 (ref 5–15)
BUN: 15 mg/dL (ref 6–20)
CHLORIDE: 98 mmol/L — AB (ref 101–111)
CO2: 32 mmol/L (ref 22–32)
Calcium: 8.9 mg/dL (ref 8.9–10.3)
Creatinine, Ser: 1.45 mg/dL — ABNORMAL HIGH (ref 0.61–1.24)
GFR calc Af Amer: 56 mL/min — ABNORMAL LOW (ref >60)
GFR calc non Af Amer: 49 mL/min — ABNORMAL LOW (ref >60)
GLUCOSE: 107 mg/dL — AB (ref 65–99)
POTASSIUM: 3.6 mmol/L (ref 3.5–5.1)
Sodium: 139 mmol/L (ref 135–145)

## 2017-09-06 LAB — CBC
HEMATOCRIT: 42.9 % (ref 39.0–52.0)
HEMOGLOBIN: 13.4 g/dL (ref 13.0–17.0)
MCH: 29.6 pg (ref 26.0–34.0)
MCHC: 31.2 g/dL (ref 30.0–36.0)
MCV: 94.9 fL (ref 78.0–100.0)
PLATELETS: 155 10*3/uL (ref 150–400)
RBC: 4.52 MIL/uL (ref 4.22–5.81)
RDW: 14.6 % (ref 11.5–15.5)
WBC: 4.6 10*3/uL (ref 4.0–10.5)

## 2017-09-06 LAB — BRAIN NATRIURETIC PEPTIDE: B NATRIURETIC PEPTIDE 5: 508.4 pg/mL — AB (ref 0.0–100.0)

## 2017-09-06 MED ORDER — FUROSEMIDE 80 MG PO TABS: 40.0000 mg | ORAL_TABLET | Freq: Two times a day (BID) | ORAL | 3 refills | Status: DC

## 2017-09-06 NOTE — Progress Notes (Signed)
  Primary Care Physician: Bland, Veita, MD Primary Electrophysiologist: Camnitz  [name] is a 67 y.o. male with a history of persistent atrial fibrillation who presents for follow up in the [facility].  Since last being seen in clinic, the patient reports doing reasonably well.   Shortness of breath is improved. He continues to diurese very well. He is down 30 pounds in 8 days!  Today, he  denies symptoms of palpitations, chest pain, shortness of breath, orthopnea, PND, lower extremity edema, dizziness, presyncope, syncope, snoring, daytime somnolence, bleeding, or neurologic sequela. The patient is tolerating medications without difficulties and is otherwise without complaint today.   Atrial Fibrillation Risk Factors:  he does not have symptoms or diagnosis of sleep apnea.  he does not have a history of rheumatic fever.  he does not have a history of alcohol use.  he has a BMI of Body mass index is 34.47 kg/m.. Filed Weights   09/06/17 0846  Weight: 283 lb 3.2 oz (128.5 kg)    Atrial Fibrillation Management history:  Previous antiarrhythmic drugs: Tikosyn - 2013, stopped at unknown time - pt does not remember when it was discontinued. Allergy to amiodarone listed, pt unsure of reaction  Previous ablations: 2012 - Dr Mounsey, [facility], convergent procedure  CHADS2VASC score: 3  Anticoagulation history: Warfarin ->Xarelto    Past Medical History:  Diagnosis Date  . Atrial tachycardia (HCC)   . Cardiomyopathy (HCC)   . Hypertension   . Mitral regurgitation   . Paroxysmal atrial flutter (HCC)   . Persistent atrial fibrillation (HCC)    Past Surgical History:  Procedure Laterality Date  . CARDIAC ELECTROPHYSIOLOGY STUDY AND ABLATION  2012   Dr. Mouncey[facility]  . LOOP RECORDER IMPLANT  06/11/2010    Current Outpatient Medications  Medication Sig Dispense Refill  . furosemide (LASIX) 80 MG tablet Take 0.5 tablets (40 mg total) by  mouth 2 (two) times daily. 60 tablet 3  . metoprolol tartrate (LOPRESSOR) 50 MG tablet Take 1 tablet (50 mg total) by mouth 2 (two) times daily. 60 tablet 3  . rivaroxaban (XARELTO) 20 MG TABS tablet Take 1 tablet (20 mg total) by mouth daily with supper. 30 tablet 6   No current facility-administered medications for this encounter.     Allergies  Allergen Reactions  . Amiodarone     Social History   Socioeconomic History  . Marital status: Widowed    Spouse name: Not on file  . Number of children: Not on file  . Years of education: Not on file  . Highest education level: Not on file  Occupational History  . Not on file  Social Needs  . Financial resource strain: Not on file  . Food insecurity:    Worry: Not on file    Inability: Not on file  . Transportation needs:    Medical: Not on file    Non-medical: Not on file  Tobacco Use  . Smoking status: Never Smoker  . Smokeless tobacco: Never Used  Substance and Sexual Activity  . Alcohol use: Yes    Comment: rare  . Drug use: Not on file  . Sexual activity: Not on file  Lifestyle  . Physical activity:    Days per week: Not on file    Minutes per session: Not on file  . Stress: Not on file  Relationships  . Social connections:    Talks on phone: Not on file    Gets together: Not on   file    Attends religious service: Not on file    Active member of club or organization: Not on file    Attends meetings of clubs or organizations: Not on file    Relationship status: Not on file  . Intimate partner violence:    Fear of current or ex partner: Not on file    Emotionally abused: Not on file    Physically abused: Not on file    Forced sexual activity: Not on file  Other Topics Concern  . Not on file  Social History Narrative  . Not on file    ROS- All systems are reviewed and negative except as per the HPI above.  Physical Exam: Vitals:   09/06/17 0846  BP: 108/68  Pulse: (!) 139  Weight: 283 lb 3.2 oz (128.5  kg)  Height: 6' 4" (1.93 m)    GEN- The patient is well appearing, alert and oriented x 3 today.   Head- normocephalic, atraumatic Eyes-  Sclera clear, conjunctiva pink Ears- hearing intact Oropharynx- clear Neck- supple  Lungs- Clear to ausculation bilaterally, normal work of breathing Heart- Tachycardic regular rate and rhythm   GI- soft, NT, ND, + BS Extremities- no clubbing, cyanosis, 1+ BLE edema MS- no significant deformity or atrophy Skin- no rash or lesion Psych- euthymic mood, full affect Neuro- strength and sensation are intact  Wt Readings from Last 3 Encounters:  09/06/17 283 lb 3.2 oz (128.5 kg)  08/31/17 (!) 300 lb 12.8 oz (136.4 kg)  08/29/17 (!) 315 lb 6.4 oz (143.1 kg)    EKG today demonstrates atypical atrial flutter, rate 139  Epic records are reviewed at length today  Assessment and Plan:  1. Persistet atrial fibrillation/atypical atrial flutter In the setting of prior AF ablation by Dr Mounsey 2012. He has now been back on Xarelto without missed doses since 08/31/17 BP does not allow for uptitration of BB Will plan TEE/DCCV next week.  Importance of Xarelto compliance reviewed with patient today. Risks, benefits to TEE/DCCV reviewed with patient who wishes to proceed.  Echo unable to be done today 2/2 tachycardia - will look at EF during TEE He was previously on Entresto - will need to consider adding back depending on TEE results and if BP improved once back in SR Continue Xarelto for CHADS2VASC of 3 He has previously been on Tikosyn - this can be restarted if he has recurrence of atrial arrhythmias after cardioversion  2.  Acute on chronic systolic heart failure Down 30 pounds since 08/29/17 Decrease Lasix to 40mg bid today as he is approaching dry weight BMET, BNP, CBC today Continue BB Add Entresto as above if BP improved in SR after cardioversion  3.  HTN Stable No change required today  Follow up with AF clinic after cardioversion. I think  he would be a reasonable repeat ablation candidate with Dr Camnitz if needed.   Kabao Leite, NP 09/06/2017 9:14 AM    

## 2017-09-06 NOTE — Patient Instructions (Signed)
Cardioversion scheduled for Tuesday, June 25th  - Arrive at the [employer] and go to admitting at 10:30AM  -Do not eat or drink anything after midnight the night prior to your procedure.  - Take all your morning medication with a sip of water prior to arrival.  - You will not be able to drive home after your procedure.

## 2017-09-06 NOTE — H&P (View-Only) (Signed)
Primary Care Physician: Renaye Rakers, MD Primary Electrophysiologist: Burnell Blanks [name] is a 67 y.o. male with a history of persistent atrial fibrillation who presents for follow up in the [facility].  Since last being seen in clinic, the patient reports doing reasonably well.   Shortness of breath is improved. He continues to diurese very well. He is down 30 pounds in 8 days!  Today, he  denies symptoms of palpitations, chest pain, shortness of breath, orthopnea, PND, lower extremity edema, dizziness, presyncope, syncope, snoring, daytime somnolence, bleeding, or neurologic sequela. The patient is tolerating medications without difficulties and is otherwise without complaint today.   Atrial Fibrillation Risk Factors:  he does not have symptoms or diagnosis of sleep apnea.  he does not have a history of rheumatic fever.  he does not have a history of alcohol use.  he has a BMI of Body mass index is 34.47 kg/m.Marland Kitchen Filed Weights   09/06/17 0846  Weight: 283 lb 3.2 oz (128.5 kg)    Atrial Fibrillation Management history:  Previous antiarrhythmic drugs: Tikosyn - 2013, stopped at unknown time - pt does not remember when it was discontinued. Allergy to amiodarone listed, pt unsure of reaction  Previous ablations: 2012 - Dr Hurman Horn, [facility], convergent procedure  [facility] score: 3  Anticoagulation history: Warfarin ->Xarelto    Past Medical History:  Diagnosis Date  . Atrial tachycardia (HCC)   . Cardiomyopathy (HCC)   . Hypertension   . Mitral regurgitation   . Paroxysmal atrial flutter (HCC)   . Persistent atrial fibrillation [facility])    Past Surgical History:  Procedure Laterality Date  . CARDIAC ELECTROPHYSIOLOGY STUDY AND ABLATION  2012   Dr. Riccardo Dubin  . LOOP RECORDER IMPLANT  06/11/2010    Current Outpatient Medications  Medication Sig Dispense Refill  . furosemide (LASIX) 80 MG tablet Take 0.5 tablets (40 mg total) by  mouth 2 (two) times daily. 60 tablet 3  . metoprolol tartrate (LOPRESSOR) 50 MG tablet Take 1 tablet (50 mg total) by mouth 2 (two) times daily. 60 tablet 3  . rivaroxaban (XARELTO) 20 MG TABS tablet Take 1 tablet (20 mg total) by mouth daily with supper. 30 tablet 6   No current facility-administered medications for this encounter.     Allergies  Allergen Reactions  . Amiodarone     Social History   Socioeconomic History  . Marital status: Widowed    Spouse name: Not on file  . Number of children: Not on file  . Years of education: Not on file  . Highest education level: Not on file  Occupational History  . Not on file  Social Needs  . Financial resource strain: Not on file  . Food insecurity:    Worry: Not on file    Inability: Not on file  . Transportation needs:    Medical: Not on file    Non-medical: Not on file  Tobacco Use  . Smoking status: Never Smoker  . Smokeless tobacco: Never Used  Substance and Sexual Activity  . Alcohol use: Yes    Comment: rare  . Drug use: Not on file  . Sexual activity: Not on file  Lifestyle  . Physical activity:    Days per week: Not on file    Minutes per session: Not on file  . Stress: Not on file  Relationships  . Social connections:    Talks on phone: Not on file    Gets together: Not on  file    Attends religious service: Not on file    Active member of club or organization: Not on file    Attends meetings of clubs or organizations: Not on file    Relationship status: Not on file  . Intimate partner violence:    Fear of current or ex partner: Not on file    Emotionally abused: Not on file    Physically abused: Not on file    Forced sexual activity: Not on file  Other Topics Concern  . Not on file  Social History Narrative  . Not on file    ROS- All systems are reviewed and negative except as per the HPI above.  Physical Exam: Vitals:   09/06/17 0846  BP: 108/68  Pulse: (!) 139  Weight: 283 lb 3.2 oz (128.5  kg)  Height: 6\' 4"  (1.93 m)    GEN- The patient is well appearing, alert and oriented x 3 today.   Head- normocephalic, atraumatic Eyes-  Sclera clear, conjunctiva pink Ears- hearing intact Oropharynx- clear Neck- supple  Lungs- Clear to ausculation bilaterally, normal work of breathing Heart- Tachycardic regular rate and rhythm   GI- soft, NT, ND, + BS Extremities- no clubbing, cyanosis, 1+ BLE edema MS- no significant deformity or atrophy Skin- no rash or lesion Psych- euthymic mood, full affect Neuro- strength and sensation are intact  Wt Readings from Last 3 Encounters:  09/06/17 283 lb 3.2 oz (128.5 kg)  08/31/17 (!) 300 lb 12.8 oz (136.4 kg)  08/29/17 (!) 315 lb 6.4 oz (143.1 kg)    EKG today demonstrates atypical atrial flutter, rate 139  Epic records are reviewed at length today  Assessment and Plan:  1. Persistet atrial fibrillation/atypical atrial flutter In the setting of prior AF ablation by Dr Hurman Horn 2012. He has now been back on Xarelto without missed doses since 08/31/17 BP does not allow for uptitration of BB Will plan TEE/DCCV next week.  Importance of Xarelto compliance reviewed with patient today. Risks, benefits to TEE/DCCV reviewed with patient who wishes to proceed.  Echo unable to be done today 2/2 tachycardia - will look at EF during TEE He was previously on Entresto - will need to consider adding back depending on TEE results and if BP improved once back in SR Continue Xarelto for CHADS2VASC of 3 He has previously been on Tikosyn - this can be restarted if he has recurrence of atrial arrhythmias after cardioversion  2.  Acute on chronic systolic heart failure Down 30 pounds since 08/29/17 Decrease Lasix to 40mg  bid today as he is approaching dry weight BMET, BNP, CBC today Continue BB Add Entresto as above if BP improved in SR after cardioversion  3.  HTN Stable No change required today  Follow up with AF clinic after cardioversion. I think  he would be a reasonable repeat ablation candidate with Dr Elberta Fortis if needed.   Gypsy Balsam, NP 09/06/2017 9:14 AM

## 2017-09-12 ENCOUNTER — Ambulatory Visit (HOSPITAL_BASED_OUTPATIENT_CLINIC_OR_DEPARTMENT_OTHER): Payer: 59

## 2017-09-12 ENCOUNTER — Ambulatory Visit (HOSPITAL_COMMUNITY): Payer: 59 | Admitting: Certified Registered"

## 2017-09-12 ENCOUNTER — Encounter (HOSPITAL_COMMUNITY)
Admission: RE | Disposition: A | Discharge status: Home / Self Care | Payer: Self-pay | Source: Ambulatory Visit | Attending: Cardiology

## 2017-09-12 ENCOUNTER — Encounter (HOSPITAL_COMMUNITY): Payer: Self-pay

## 2017-09-12 ENCOUNTER — Ambulatory Visit (HOSPITAL_BASED_OUTPATIENT_CLINIC_OR_DEPARTMENT_OTHER)
Admission: RE | Admit: 2017-09-12 | Discharge: 2017-09-12 | Disposition: A | Discharge status: Home / Self Care | Payer: 59 | Source: Ambulatory Visit | Attending: Cardiology | Admitting: Cardiology

## 2017-09-12 ENCOUNTER — Other Ambulatory Visit: Payer: Self-pay

## 2017-09-12 DIAGNOSIS — Z7902 Long term (current) use of antithrombotics/antiplatelets: Secondary | ICD-10-CM

## 2017-09-12 DIAGNOSIS — I110 Hypertensive heart disease with heart failure: Secondary | ICD-10-CM | POA: Insufficient documentation

## 2017-09-12 DIAGNOSIS — I484 Atypical atrial flutter: Secondary | ICD-10-CM

## 2017-09-12 DIAGNOSIS — I051 Rheumatic mitral insufficiency: Secondary | ICD-10-CM

## 2017-09-12 DIAGNOSIS — Z888 Allergy status to other drugs, medicaments and biological substances status: Secondary | ICD-10-CM

## 2017-09-12 DIAGNOSIS — I481 Persistent atrial fibrillation: Secondary | ICD-10-CM

## 2017-09-12 DIAGNOSIS — I4892 Unspecified atrial flutter: Secondary | ICD-10-CM

## 2017-09-12 DIAGNOSIS — I420 Dilated cardiomyopathy: Secondary | ICD-10-CM

## 2017-09-12 DIAGNOSIS — Z9889 Other specified postprocedural states: Secondary | ICD-10-CM

## 2017-09-12 DIAGNOSIS — Z79899 Other long term (current) drug therapy: Secondary | ICD-10-CM

## 2017-09-12 DIAGNOSIS — I5042 Chronic combined systolic (congestive) and diastolic (congestive) heart failure: Secondary | ICD-10-CM

## 2017-09-12 DIAGNOSIS — I340 Nonrheumatic mitral (valve) insufficiency: Secondary | ICD-10-CM

## 2017-09-12 DIAGNOSIS — I471 Supraventricular tachycardia: Secondary | ICD-10-CM | POA: Insufficient documentation

## 2017-09-12 HISTORY — PX: CARDIOVERSION: SHX1299

## 2017-09-12 HISTORY — PX: TEE WITHOUT CARDIOVERSION: SHX5443

## 2017-09-12 SURGERY — CARDIOVERSION
Anesthesia: General

## 2017-09-12 MED ORDER — LIDOCAINE HCL (CARDIAC) PF 100 MG/5ML IV SOSY
PREFILLED_SYRINGE | INTRAVENOUS | Status: DC | PRN
  Administered 2017-09-12: 100 mg via INTRAVENOUS

## 2017-09-12 MED ORDER — LACTATED RINGERS IV SOLN
INTRAVENOUS | Status: DC | PRN
  Administered 2017-09-12: 12:00:00 via INTRAVENOUS

## 2017-09-12 MED ORDER — PROPOFOL 500 MG/50ML IV EMUL
INTRAVENOUS | Status: DC | PRN
  Administered 2017-09-12: 75 ug/kg/min via INTRAVENOUS

## 2017-09-12 MED ORDER — PROPOFOL 10 MG/ML IV BOLUS
INTRAVENOUS | Status: DC | PRN
  Administered 2017-09-12: 30 mg via INTRAVENOUS

## 2017-09-12 MED ORDER — PHENYLEPHRINE 40 MCG/ML (10ML) SYRINGE FOR IV PUSH (FOR BLOOD PRESSURE SUPPORT)
PREFILLED_SYRINGE | INTRAVENOUS | Status: DC | PRN
  Administered 2017-09-12 (×2): 200 ug via INTRAVENOUS

## 2017-09-12 MED ORDER — SODIUM CHLORIDE 0.9 % IV SOLN: INTRAVENOUS | Status: DC

## 2017-09-12 NOTE — Transfer of Care (Signed)
Immediate Anesthesia Transfer of Care Note  Patient: [name]  Procedure(s) Performed: CARDIOVERSION (N/A ) TRANSESOPHAGEAL ECHOCARDIOGRAM (TEE) (N/A )  Patient Location: Endoscopy Unit  Anesthesia Type:MAC  Level of Consciousness: awake, alert  and oriented  Airway & Oxygen Therapy: Patient Spontanous Breathing and Patient connected to nasal cannula oxygen  Post-op Assessment: Report given to RN and Post -op Vital signs reviewed and stable  Post vital signs: Reviewed and stable  Last Vitals:  Vitals Value Taken Time  BP 92/70 09/12/2017 12:41 PM  Temp    Pulse 82 09/12/2017 12:42 PM  Resp 27 09/12/2017 12:42 PM  SpO2 97 % 09/12/2017 12:42 PM  Vitals shown include unvalidated device data.  Last Pain:  Vitals:   09/12/17 1103  TempSrc: Oral  PainSc: 0-No pain         Complications: No apparent anesthesia complications

## 2017-09-12 NOTE — Progress Notes (Signed)
  Echocardiogram Echocardiogram Transesophageal has been performed.  [name] F 09/12/2017, 12:55 PM

## 2017-09-12 NOTE — Interval H&P Note (Signed)
History and Physical Interval Note:  09/12/2017 11:20 AM  [name]  has presented today for surgery, with the diagnosis of afib  The various methods of treatment have been discussed with the patient and family. After consideration of risks, benefits and other options for treatment, the patient has consented to  Procedure(s): CARDIOVERSION (N/A) TRANSESOPHAGEAL ECHOCARDIOGRAM (TEE) (N/A) as a surgical intervention .  The patient's history has been reviewed, patient examined, no change in status, stable for surgery.  I have reviewed the patient's chart and labs.  Questions were answered to the patient's satisfaction.     [employer]

## 2017-09-12 NOTE — CV Procedure (Signed)
.     Electrical Cardioversion/TEE Procedure Note [name] [id number] 03/28/1950  Procedure: Electrical Cardioversion Indications:  Atrial Flutter  Time Out: Verified patient identification, verified procedure,medications/allergies/relevent history reviewed, required imaging and test results available.  Performed  Procedure Details  The patient was NPO after midnight. Anesthesia was administered at the beside  by Dr. Malen Gauze with propofol.  Cardioversion was performed with synchronized biphasic defibrillation via AP pads with 120 joules.  1 attempt(s) were performed.  The patient converted to normal sinus rhythm. The patient tolerated the procedure well. TEE performed prior to DCCV.   IMPRESSION:  LVEF 15-20% with tachycardia Mitral regurgitation mild Tricuspid regurgitation moderate No LA appendage thrombus  Successful cardioversion of atrial flutter to NSR 70  Donato Schultz, MD         Donato Schultz 09/12/2017, 12:20 PM

## 2017-09-12 NOTE — Anesthesia Preprocedure Evaluation (Addendum)
Anesthesia Evaluation  Patient identified by MRN, date of birth, ID band Patient awake    Reviewed: Allergy & Precautions, NPO status , Patient's Chart, lab work & pertinent test results, reviewed documented beta blocker date and time   Airway Mallampati: II  TM Distance: >3 FB Neck ROM: Full    Dental no notable dental hx. (+) Teeth Intact   Pulmonary neg pulmonary ROS,    Pulmonary exam normal breath sounds clear to auscultation       Cardiovascular hypertension, Pt. on medications and Pt. on home beta blockers + dysrhythmias Atrial Fibrillation + Valvular Problems/Murmurs MR  Rhythm:Regular Rate:Tachycardia  Cardiomyopathy Atrial Fibrillation/Flutter recurrent S/P ablation 2012   Neuro/Psych negative neurological ROS  negative psych ROS   GI/Hepatic negative GI ROS, Neg liver ROS,   Endo/Other  Obesity  Renal/GU negative Renal ROS  negative genitourinary   Musculoskeletal negative musculoskeletal ROS (+)   Abdominal   Peds  Hematology Xarelto therapy- last dose yesterday pm   Anesthesia Other Findings   Reproductive/Obstetrics                            Anesthesia Physical Anesthesia Plan  ASA: III  Anesthesia Plan: General   Post-op Pain Management:    Induction: Intravenous  PONV Risk Score and Plan: 2 and Ondansetron, Propofol infusion and Treatment may vary due to age or medical condition  Airway Management Planned: Mask and Nasal Cannula  Additional Equipment:   Intra-op Plan:   Post-operative Plan:   Informed Consent: I have reviewed the patients History and Physical, chart, labs and discussed the procedure including the risks, benefits and alternatives for the proposed anesthesia with the patient or authorized representative who has indicated his/her understanding and acceptance.   Dental advisory given  Plan Discussed with: CRNA and Surgeon  Anesthesia Plan  Comments:         Anesthesia Quick Evaluation

## 2017-09-12 NOTE — Anesthesia Procedure Notes (Signed)
Procedure Name: MAC Date/Time: 09/12/2017 12:07 PM Performed by: Teressa Lower., CRNA Pre-anesthesia Checklist: Patient identified, Emergency Drugs available, Suction available, Patient being monitored and Timeout performed Patient Re-evaluated:Patient Re-evaluated prior to induction Oxygen Delivery Method: Nasal cannula

## 2017-09-12 NOTE — Discharge Instructions (Signed)
Electrical Cardioversion, Care After °This sheet gives you information about how to care for yourself after your procedure. Your health care provider may also give you more specific instructions. If you have problems or questions, contact your health care provider. °What can I expect after the procedure? °After the procedure, it is common to have: °· Some redness on the skin where the shocks were given. ° °Follow these instructions at home: °· Do not drive for 24 hours if you were given a medicine to help you relax (sedative). °· Take over-the-counter and prescription medicines only as told by your health care provider. °· Ask your health care provider how to check your pulse. Check it often. °· Rest for 48 hours after the procedure or as told by your health care provider. °· Avoid or limit your caffeine use as told by your health care provider. °Contact a health care provider if: °· You feel like your heart is beating too quickly or your pulse is not regular. °· You have a serious muscle cramp that does not go away. °Get help right away if: °· You have discomfort in your chest. °· You are dizzy or you feel faint. °· You have trouble breathing or you are short of breath. °· Your speech is slurred. °· You have trouble moving an arm or leg on one side of your body. °· Your fingers or toes turn cold or blue. °This information is not intended to replace advice given to you by your health care provider. Make sure you discuss any questions you have with your health care provider. °Document Released: 12/26/2012 Document Revised: 10/09/2015 Document Reviewed: 09/11/2015 °Elsevier Interactive Patient Education © 2018 [employer] °Transesophageal Echocardiogram °Transesophageal echocardiography (TEE) is a picture test of your heart using sound waves. The pictures taken can give very detailed pictures of your heart. This can help your doctor see if there are problems with your heart. TEE can check: °· If your heart has blood  clots in it. °· How well your heart valves are working. °· If you have an infection on the inside of your heart. °· Some of the major arteries of your heart. °· If your heart valve is working after a repair. °· Your heart before a procedure that uses a shock to your heart to get the rhythm back to normal. ° °What happens before the procedure? °· Do not eat or drink for 6 hours before the procedure or as told by your doctor. °· Make plans to have someone drive you home after the procedure. Do not drive yourself home. °· An IV tube will be put in your arm. °What happens during the procedure? °· You will be given a medicine to help you relax (sedative). It will be given through the IV tube. °· A numbing medicine will be sprayed or gargled in the back of your throat to help numb it. °· The tip of the probe is placed into the back of your mouth. You will be asked to swallow. This helps to pass the probe into your esophagus. °· Once the tip of the probe is in the right place, your doctor can take pictures of your heart. °· You may feel pressure at the back of your throat. °What happens after the procedure? °· You will be taken to a recovery area so the sedative can wear off. °· Your throat may be sore and scratchy. This will go away slowly over time. °· You will go home when you are fully awake and   able to swallow liquids.  You should have someone stay with you for the next 24 hours.  Do not drive or operate machinery for the next 24 hours. This information is not intended to replace advice given to you by your health care provider. Make sure you discuss any questions you have with your health care provider. Document Released: 01/02/2009 Document Revised: 08/13/2015 Document Reviewed: 09/06/2012 Elsevier Interactive Patient Education  [employer].

## 2017-09-12 NOTE — Anesthesia Postprocedure Evaluation (Signed)
Anesthesia Post Note  Patient: [name]  Procedure(s) Performed: CARDIOVERSION (N/A ) TRANSESOPHAGEAL ECHOCARDIOGRAM (TEE) (N/A )     Patient location during evaluation: PACU Anesthesia Type: General Level of consciousness: awake and alert and oriented Pain management: pain level controlled Vital Signs Assessment: post-procedure vital signs reviewed and stable Respiratory status: spontaneous breathing, nonlabored ventilation and respiratory function stable Cardiovascular status: blood pressure returned to baseline and stable Postop Assessment: no apparent nausea or vomiting Anesthetic complications: no    Last Vitals:  Vitals:   09/12/17 1250 09/12/17 1300  BP: (!) 84/70 97/74  Pulse: 82 82  Resp: 17 14  Temp:    SpO2: 97% 99%    Last Pain:  Vitals:   09/12/17 1241  TempSrc: Oral  PainSc: 0-No pain                 [name]

## 2017-09-13 ENCOUNTER — Encounter (HOSPITAL_COMMUNITY): Payer: Self-pay | Admitting: Cardiology

## 2017-09-14 ENCOUNTER — Other Ambulatory Visit: Payer: Self-pay

## 2017-09-14 ENCOUNTER — Ambulatory Visit (HOSPITAL_COMMUNITY): Payer: 59 | Admitting: Nurse Practitioner

## 2017-09-14 ENCOUNTER — Inpatient Hospital Stay (HOSPITAL_COMMUNITY)
Admission: AD | Admit: 2017-09-14 | Discharge: 2017-09-18 | DRG: 291 | Disposition: A | Discharge status: Home / Self Care | Payer: 59 | Source: Ambulatory Visit | Attending: Internal Medicine | Admitting: Internal Medicine

## 2017-09-14 ENCOUNTER — Inpatient Hospital Stay: Payer: Self-pay

## 2017-09-14 ENCOUNTER — Ambulatory Visit (HOSPITAL_BASED_OUTPATIENT_CLINIC_OR_DEPARTMENT_OTHER)
Admission: RE | Admit: 2017-09-14 | Discharge: 2017-09-14 | Disposition: A | Discharge status: Home / Self Care | Payer: 59 | Source: Ambulatory Visit | Attending: Nurse Practitioner | Admitting: Nurse Practitioner

## 2017-09-14 ENCOUNTER — Encounter (HOSPITAL_COMMUNITY): Payer: Self-pay | Admitting: Nurse Practitioner

## 2017-09-14 ENCOUNTER — Encounter (HOSPITAL_COMMUNITY): Payer: Self-pay | Admitting: General Practice

## 2017-09-14 VITALS — BP 116/74 | HR 72 | Ht 76.0 in | Wt 294.6 lb

## 2017-09-14 DIAGNOSIS — N183 Chronic kidney disease, stage 3 (moderate): Secondary | ICD-10-CM | POA: Diagnosis present

## 2017-09-14 DIAGNOSIS — I130 Hypertensive heart and chronic kidney disease with heart failure and stage 1 through stage 4 chronic kidney disease, or unspecified chronic kidney disease: Principal | ICD-10-CM | POA: Diagnosis present

## 2017-09-14 DIAGNOSIS — I481 Persistent atrial fibrillation: Secondary | ICD-10-CM | POA: Diagnosis present

## 2017-09-14 DIAGNOSIS — I340 Nonrheumatic mitral (valve) insufficiency: Secondary | ICD-10-CM | POA: Diagnosis present

## 2017-09-14 DIAGNOSIS — Z6835 Body mass index (BMI) 35.0-35.9, adult: Secondary | ICD-10-CM | POA: Diagnosis not present

## 2017-09-14 DIAGNOSIS — I5023 Acute on chronic systolic (congestive) heart failure: Secondary | ICD-10-CM

## 2017-09-14 DIAGNOSIS — Z79899 Other long term (current) drug therapy: Secondary | ICD-10-CM | POA: Diagnosis not present

## 2017-09-14 DIAGNOSIS — I4892 Unspecified atrial flutter: Secondary | ICD-10-CM | POA: Diagnosis present

## 2017-09-14 DIAGNOSIS — E669 Obesity, unspecified: Secondary | ICD-10-CM | POA: Diagnosis present

## 2017-09-14 DIAGNOSIS — Z888 Allergy status to other drugs, medicaments and biological substances status: Secondary | ICD-10-CM | POA: Diagnosis not present

## 2017-09-14 DIAGNOSIS — I5022 Chronic systolic (congestive) heart failure: Secondary | ICD-10-CM | POA: Diagnosis present

## 2017-09-14 DIAGNOSIS — I493 Ventricular premature depolarization: Secondary | ICD-10-CM | POA: Diagnosis not present

## 2017-09-14 DIAGNOSIS — Z791 Long term (current) use of non-steroidal anti-inflammatories (NSAID): Secondary | ICD-10-CM | POA: Diagnosis not present

## 2017-09-14 DIAGNOSIS — N179 Acute kidney failure, unspecified: Secondary | ICD-10-CM | POA: Diagnosis present

## 2017-09-14 DIAGNOSIS — R011 Cardiac murmur, unspecified: Secondary | ICD-10-CM | POA: Diagnosis present

## 2017-09-14 DIAGNOSIS — I480 Paroxysmal atrial fibrillation: Secondary | ICD-10-CM | POA: Diagnosis not present

## 2017-09-14 DIAGNOSIS — E876 Hypokalemia: Secondary | ICD-10-CM | POA: Diagnosis present

## 2017-09-14 DIAGNOSIS — I429 Cardiomyopathy, unspecified: Secondary | ICD-10-CM | POA: Diagnosis present

## 2017-09-14 DIAGNOSIS — Z7901 Long term (current) use of anticoagulants: Secondary | ICD-10-CM | POA: Diagnosis not present

## 2017-09-14 LAB — CBC
HCT: 36.2 % — ABNORMAL LOW (ref 39.0–52.0)
HEMOGLOBIN: 11.5 g/dL — AB (ref 13.0–17.0)
MCH: 30.3 pg (ref 26.0–34.0)
MCHC: 31.8 g/dL (ref 30.0–36.0)
MCV: 95.3 fL (ref 78.0–100.0)
Platelets: 114 10*3/uL — ABNORMAL LOW (ref 150–400)
RBC: 3.8 MIL/uL — ABNORMAL LOW (ref 4.22–5.81)
RDW: 14.8 % (ref 11.5–15.5)
WBC: 5.2 10*3/uL (ref 4.0–10.5)

## 2017-09-14 LAB — COMPREHENSIVE METABOLIC PANEL
ALK PHOS: 56 U/L (ref 38–126)
ALT: 20 U/L (ref 0–44)
ANION GAP: 9 (ref 5–15)
AST: 25 U/L (ref 15–41)
Albumin: 3.1 g/dL — ABNORMAL LOW (ref 3.5–5.0)
BILIRUBIN TOTAL: 0.8 mg/dL (ref 0.3–1.2)
BUN: 18 mg/dL (ref 8–23)
CALCIUM: 8.6 mg/dL — AB (ref 8.9–10.3)
CO2: 26 mmol/L (ref 22–32)
Chloride: 105 mmol/L (ref 98–111)
Creatinine, Ser: 1.83 mg/dL — ABNORMAL HIGH (ref 0.61–1.24)
GFR calc Af Amer: 43 mL/min — ABNORMAL LOW (ref >60)
GFR, EST NON AFRICAN AMERICAN: 37 mL/min — AB (ref >60)
GLUCOSE: 138 mg/dL — AB (ref 70–99)
Potassium: 3.2 mmol/L — ABNORMAL LOW (ref 3.5–5.1)
Sodium: 140 mmol/L (ref 135–145)
TOTAL PROTEIN: 6.4 g/dL — AB (ref 6.5–8.1)

## 2017-09-14 LAB — BRAIN NATRIURETIC PEPTIDE: B NATRIURETIC PEPTIDE 5: 1006.9 pg/mL — AB (ref 0.0–100.0)

## 2017-09-14 MED ORDER — HEPARIN (PORCINE) IN NACL 100-0.45 UNIT/ML-% IJ SOLN
1600.0000 [IU]/h | INTRAMUSCULAR | Status: DC
  Filled 2017-09-14: qty 250

## 2017-09-14 MED ORDER — METOPROLOL TARTRATE 50 MG PO TABS
50.0000 mg | ORAL_TABLET | Freq: Two times a day (BID) | ORAL | Status: DC
  Administered 2017-09-14 – 2017-09-16 (×5): 50 mg via ORAL
  Filled 2017-09-14 (×5): qty 1

## 2017-09-14 MED ORDER — NITROGLYCERIN 0.4 MG SL SUBL: 0.4000 mg | SUBLINGUAL_TABLET | SUBLINGUAL | Status: DC | PRN

## 2017-09-14 MED ORDER — ASPIRIN EC 81 MG PO TBEC
81.0000 mg | DELAYED_RELEASE_TABLET | Freq: Every day | ORAL | Status: DC
  Administered 2017-09-15 – 2017-09-16 (×2): 81 mg via ORAL
  Filled 2017-09-14 (×2): qty 1

## 2017-09-14 MED ORDER — HEPARIN (PORCINE) IN NACL 100-0.45 UNIT/ML-% IJ SOLN
1500.0000 [IU]/h | INTRAMUSCULAR | Status: DC
Start: 2017-09-14 — End: 2017-09-16
  Administered 2017-09-14 – 2017-09-15 (×2): 1600 [IU]/h via INTRAVENOUS
  Administered 2017-09-16: 1500 [IU]/h via INTRAVENOUS
  Filled 2017-09-14 (×2): qty 250

## 2017-09-14 MED ORDER — SODIUM CHLORIDE 0.9% FLUSH
10.0000 mL | INTRAVENOUS | Status: DC | PRN
  Administered 2017-09-15: 30 mL
  Administered 2017-09-17: 10 mL
  Filled 2017-09-14 (×2): qty 40

## 2017-09-14 MED ORDER — ONDANSETRON HCL 4 MG/2ML IJ SOLN: 4.0000 mg | Freq: Four times a day (QID) | INTRAMUSCULAR | Status: DC | PRN

## 2017-09-14 MED ORDER — ACETAMINOPHEN 325 MG PO TABS: 650.0000 mg | ORAL_TABLET | ORAL | Status: DC | PRN

## 2017-09-14 MED ORDER — POTASSIUM CHLORIDE CRYS ER 20 MEQ PO TBCR
40.0000 meq | EXTENDED_RELEASE_TABLET | Freq: Two times a day (BID) | ORAL | Status: DC
  Administered 2017-09-14 – 2017-09-18 (×9): 40 meq via ORAL
  Filled 2017-09-14 (×9): qty 2

## 2017-09-14 MED ORDER — FUROSEMIDE 10 MG/ML IJ SOLN
80.0000 mg | Freq: Every day | INTRAMUSCULAR | Status: DC
  Administered 2017-09-14 – 2017-09-17 (×4): 80 mg via INTRAVENOUS
  Filled 2017-09-14 (×4): qty 8

## 2017-09-14 NOTE — H&P (Addendum)
Cardiology Admission History and Physical:   Patient ID: JEREMMY BLAESING; MRN: [id number]; DOB: [birth year]   Admission date: 09/14/2017  Primary Care Provider: Renaye Rakers, MD Primary Cardiologist: Previously Hurman Horn [facility]) Primary Electrophysiologist:  Dr. Girard Cooter  Chief Complaint: Shortness of breath  Patient Profile:   MILLIARD BORES is a 67 y.o. male with a history of persistent atrial fibrillation prior ablation and recent cardioversion, hypertension and chronic systolic heart failure admitted from A. fib clinic directly for evaluation of acute CHF exacerbation.  He was initially diagnosed with atrial fibrillation about 9 years ago and was followed by [facility] at that time.  He was referred to [facility] for consideration of ablation and underwent a convergent AF ablation by Dr Hurman Horn. Post op, he had pericardial effusion which required draining.  He also has previously had cardiomyopathy and was on Entresto, Metoprolol, Lasix, and Xarelto. In March of this year, he presented to the ER with worsening shortness of breath and fatigue. He was found to be in atrial flutter and cardioverted. He was scheduled for follow up in the AF clinic but cancelled that appointment because he was feeling better.  A couple of months ago he ran out of all of his medications.  His cardiac care has been managed by Dr. Parke Simmers.  He was seen in A. fib clinic earlier this month with rapid atrial flutter and 40 pound weight gain.  He was diuresed as outpatient with 30 pound weight loss. He went on to have successful TEE guided cardioversion but TEE showed EF at 15-20%.  Lasix reduced to 40 mg twice daily from 80 twice daily  6/19 due to worsening renal failure.  History of Present Illness:   Mr. Peot presented to A. fib clinic for walking visit for not feeling well and lightheadedness.  He denies any orthopnea or PND.  However he had a significant lower extremity edema with 18 pound weight gain.  Lab work revealed  increase serum creatinine to 1.83 from 1.45.  BNP is elevated from prior.  He was admitted directly after discussion with Dr.Bensimhon for heart failure management.  He denies any chest pain, melena, palpitation.  Compliant with medication and low-sodium diet.  EKG today shows sinus rhythm with PAC and anterior lateral T wave inversion.  Compared to prior EKG T wave inversion in anterior lead is new.  Personally reviewed  Past Medical History:  Diagnosis Date  . Atrial tachycardia (HCC)   . Cardiomyopathy (HCC)   . Hypertension   . Mitral regurgitation   . Paroxysmal atrial flutter (HCC)   . Persistent atrial fibrillation [facility])     Past Surgical History:  Procedure Laterality Date  . CARDIAC ELECTROPHYSIOLOGY STUDY AND ABLATION  2012   Dr. Riccardo Dubin  . CARDIOVERSION N/A 09/12/2017   Procedure: CARDIOVERSION;  Surgeon: Jake Bathe, MD;  Location: [facility] ENDOSCOPY;  Service: Cardiovascular;  Laterality: N/A;  . LOOP RECORDER IMPLANT  06/11/2010  . TEE WITHOUT CARDIOVERSION N/A 09/12/2017   Procedure: TRANSESOPHAGEAL ECHOCARDIOGRAM (TEE);  Surgeon: Jake Bathe, MD;  Location: [facility] ENDOSCOPY;  Service: Cardiovascular;  Laterality: N/A;     Medications Prior to Admission: Prior to Admission medications   Medication Sig Start Date End Date Taking? Authorizing Provider  furosemide (LASIX) 80 MG tablet Take 0.5 tablets (40 mg total) by mouth 2 (two) times daily. Patient taking differently: Take 80 mg by mouth daily.  09/06/17 09/06/18 Yes Seiler, Amber K, NP  metoprolol tartrate (LOPRESSOR) 50 MG tablet Take 1 tablet (50 mg  total) by mouth 2 (two) times daily. 08/29/17  Yes Seiler, Amber K, NP  naproxen sodium (ALEVE) 220 MG tablet Take 440 mg by mouth as needed (pain).   Yes [provider]  rivaroxaban (XARELTO) 20 MG TABS tablet Take 1 tablet (20 mg total) by mouth daily with supper. 08/29/17  Yes Marily Lente, NP     Allergies:    Allergies  Allergen Reactions  . Amiodarone  Itching and Rash    Social History:   Social History   Socioeconomic History  . Marital status: Widowed    Spouse name: Not on file  . Number of children: Not on file  . Years of education: Not on file  . Highest education level: Not on file  Occupational History  . Not on file  Social Needs  . Financial resource strain: Not on file  . Food insecurity:    Worry: Not on file    Inability: Not on file  . Transportation needs:    Medical: Not on file    Non-medical: Not on file  Tobacco Use  . Smoking status: Never Smoker  . Smokeless tobacco: Never Used  Substance and Sexual Activity  . Alcohol use: Yes    Comment: rare  . Drug use: Not on file  . Sexual activity: Not on file  Lifestyle  . Physical activity:    Days per week: Not on file    Minutes per session: Not on file  . Stress: Not on file  Relationships  . Social connections:    Talks on phone: Not on file    Gets together: Not on file    Attends religious service: Not on file    Active member of club or organization: Not on file    Attends meetings of clubs or organizations: Not on file    Relationship status: Not on file  . Intimate partner violence:    Fear of current or ex partner: Not on file    Emotionally abused: Not on file    Physically abused: Not on file    Forced sexual activity: Not on file  Other Topics Concern  . Not on file  Social History Narrative  . Not on file    Family History:   Denies family hx of CAD.   ROS:  Please see the history of present illness.  All other ROS reviewed and negative.     Physical Exam/Data:   Vitals:   09/14/17 1321  BP: 112/84  Pulse: 69  Resp: 18  Temp: (!) 97.5 F (36.4 C)  TempSrc: Oral  SpO2: 100%  Weight: 292 lb 11.2 oz (132.8 kg)  Height: 6\' 4"  (1.93 m)    Intake/Output Summary (Last 24 hours) at 09/14/2017 1422 Last data filed at 09/14/2017 1320 Gross per 24 hour  Intake -  Output 400 ml  Net -400 ml   Filed Weights   09/14/17  1321  Weight: 292 lb 11.2 oz (132.8 kg)   General:  Well nourished, well developed, in no acute distress HEENT: normal Lymph: no adenopathy Neck: no JVD Endocrine:  No thryomegaly Vascular: No carotid bruits; FA pulses 2+ bilaterally without bruits  Cardiac:  normal S1, S2; RRR; no murmur Lungs:  clear to auscultation bilaterally, no wheezing, rhonchi or rales  Abd: soft, nontender, no hepatomegaly  Ext: 2+ bilateral edema Musculoskeletal:  No deformities, BUE and BLE strength normal and equal Skin: warm and dry  Neuro:  CNs 2-12 intact, no focal abnormalities noted  Psych:  Normal affect   Relevant CV Studies:  TEE 09/12/17 Study Conclusions  - Left ventricle: The cavity size was dilated. Systolic function   was severely reduced. The estimated ejection fraction was in the   range of 15% to 20%. Diffuse hypokinesis. - Aortic valve: No evidence of vegetation. - Mitral valve: No evidence of vegetation. There was mild to   moderate regurgitation. - Left atrium: The atrium was dilated. No evidence of thrombus in   the atrial cavity or appendage. No evidence of thrombus in the   appendage. - Right ventricle: Systolic function was reduced. - Right atrium: The atrium was dilated. No evidence of thrombus in   the atrial cavity or appendage. - Atrial septum: No defect or patent foramen ovale was identified.   There was no right-to-left atrial level shunt, following an   increase in RA pressure induced by provocative maneuvers. - Tricuspid valve: No evidence of vegetation. - Pulmonic valve: No evidence of vegetation.  Laboratory Data:  Chemistry Recent Labs  Lab 09/14/17 1155  NA 140  K 3.2*  CL 105  CO2 26  GLUCOSE 138*  BUN 18  CREATININE 1.83*  CALCIUM 8.6*  GFRNONAA 37*  GFRAA 43*  ANIONGAP 9    Recent Labs  Lab 09/14/17 1155  PROT 6.4*  ALBUMIN 3.1*  AST 25  ALT 20  ALKPHOS 56  BILITOT 0.8   BNP Recent Labs  Lab 09/14/17 1155  BNP 1,006.9*      Radiology/Studies:  No results found.  Assessment and Plan:   1.  Acute on chronic systolic heart failure -Presented with 11 pound weight gain since reduction in diuretics about a week ago.  Has no orthopnea or PND but noted significant lower extremity edema.  Compliant with low-sodium diet. -LV function of 15 to 20% with diffuse hypokinesis on recent transesophageal echocardiogram. -BNP 1000.  Lungs clear on auscultation.  -We will start IV Lasix with supplemental potassium.  Monitor renal function closely. -His cardiomyopathy could be ischemic versus tachycardia mediated.  EKG with new anterior T wave inversion. -Continue Lopressor 50 mg twice daily.  Add ACE/ARB/Entresto/spironolactone when stable renal function.  - Remote hx of cardiomyopathy and was on Entresto.  - GET PICC and COox. Start IV lasix 80mg  BID and Kdur BID. CHF team to see in AM.  2.  Persistent atrial fibrillation -Previous ablation at [facility] in 2012 by Dr. Hurman Horn.  Recent successful cardioversion 6/25. -Maintaining sinus rhythm.  Monitor on telemetry. - CHADSVASC score of 3 for Age, HTN and CHF.  Continue Xarelto for anticoagulation.  3. HTN - BP stable  4.  Acute on chronic kidney disease stage III -Previously his baseline serum creatinine of 1.5 in 2010.  His creatinine was 1.4 in March 2019. - SCr of 1.83 today. Follow closely with diuresis. Seems baseline around 1.3-1.5.   Severity of Illness: The appropriate patient status for this patient is INPATIENT. Inpatient status is judged to be reasonable and necessary in order to provide the required intensity of service to ensure the patient's safety. The patient's presenting symptoms, physical exam findings, and initial radiographic and laboratory data in the context of their chronic comorbidities is felt to place them at high risk for further clinical deterioration. Furthermore, it is not anticipated that the patient will be medically stable for discharge  from the hospital within 2 midnights of admission. The following factors support the patient status of inpatient.   " The patient's presenting symptoms include SOB and fatigue. "  The worrisome physical exam findings include volume overload " The initial radiographic and laboratory data are worrisome because of elevated BNP " The chronic co-morbidities include afib and CHF   * I certify that at the point of admission it is my clinical judgment that the patient will require inpatient hospital care spanning beyond 2 midnights from the point of admission due to high intensity of service, high risk for further deterioration and high frequency of surveillance required.*    For questions or updates, please contact [facility] Please consult [email or web address] for contact info under Cardiology/STEMI.    Vonzella Nipple [place], [place]  09/14/2017 2:22 PM   Patient examined chart reviewed. Wonderful black male admitted from afib clinic with CHF. Currently in SR with PaCls and on xarelto. Has had marked fluid accumulation over last 2 months with dyspnea and LE edema. Denies chest pain palpitations or syncope. Exam with comfortable black male laying flat. Basilar crackles JVP elevated MR murmur and plus 3-4 bilateral LE edema. Diuretics had been cut back due to azotemia. EF by TEE 09/12/17 EF 15-20% mild to moderate MR RV failure. Agree with hospitalization for iv diuresis will start PIC line and check co-ox as he may very well need milrinone given history of azotemia with more aggressive diuresis. ECG with new anterolateral T wave inversions post [facility] Change to heparin while in hospital will likely need right and left heart cath beginning of next week Would benefit from CHF team following in hospital and DB/DM doing cath . Further recommendations pending result of co-ox, and renal function with diuresis   Charlton Haws

## 2017-09-14 NOTE — Progress Notes (Signed)
Peripherally Inserted Central Catheter/Midline Placement  The IV Nurse has discussed with the patient and/or persons authorized to consent for the patient, the purpose of this procedure and the potential benefits and risks involved with this procedure.  The benefits include less needle sticks, lab draws from the catheter, and the patient may be discharged home with the catheter. Risks include, but not limited to, infection, bleeding, blood clot (thrombus formation), and puncture of an artery; nerve damage and irregular heartbeat and possibility to perform a PICC exchange if needed/ordered by physician.  Alternatives to this procedure were also discussed.  Bard Power PICC patient education guide, fact sheet on infection prevention and patient information card has been provided to patient /or left at bedside.    PICC/Midline Placement Documentation  PICC Double Lumen 09/14/17 PICC Right Basilic 44 cm 0 cm (Active)  Indication for Insertion or Continuance of Line Chronic illness with exacerbations (CF, Sickle Cell, etc.) 09/14/2017  5:04 PM  Exposed Catheter (cm) 0 cm 09/14/2017  5:04 PM  Site Assessment Clean;Dry;Intact 09/14/2017  5:04 PM  Lumen #1 Status Flushed;Blood return noted 09/14/2017  5:04 PM  Lumen #2 Status Flushed;Blood return noted 09/14/2017  5:04 PM  Dressing Type Transparent 09/14/2017  5:04 PM  Dressing Status Clean;Dry;Intact;Antimicrobial disc in place 09/14/2017  5:04 PM  Dressing Intervention New dressing 09/14/2017  5:04 PM  Dressing Change Due 09/21/17 09/14/2017  5:04 PM       Reginia Forts [place] 09/14/2017, 5:05 PM

## 2017-09-14 NOTE — Progress Notes (Addendum)
ANTICOAGULATION CONSULT NOTE - Initial Consult  Pharmacy Consult for heparin Indication: atrial fibrillation  Allergies  Allergen Reactions  . Amiodarone Itching and Rash    Patient Measurements: Height: 6\' 4"  (193 cm) Weight: 292 lb 11.2 oz (132.8 kg) IBW/kg (Calculated) : 86.8 Heparin Dosing Weight: 115 kg  Vital Signs: Temp: 97.5 F (36.4 C) (06/27 1321) Temp Source: Oral (06/27 1321) BP: 112/84 (06/27 1321) Pulse Rate: 69 (06/27 1321)  Labs: Recent Labs    09/14/17 1155  CREATININE 1.83*    Estimated Creatinine Clearance: 59.1 mL/min (A) (by C-G formula based on SCr of 1.83 mg/dL (H)).   Medical History: Past Medical History:  Diagnosis Date  . Atrial tachycardia (HCC)   . Cardiomyopathy (HCC)   . Hypertension   . Mitral regurgitation   . Paroxysmal atrial flutter (HCC)   . Persistent atrial fibrillation (HCC)     Medications:  Scheduled:  . [START ON 09/15/2017] aspirin EC  81 mg Oral Daily  . furosemide  80 mg Intravenous Daily  . metoprolol tartrate  50 mg Oral BID  . potassium chloride  40 mEq Oral BID    Assessment: 66 yom with history of afib who underwent successful TEE cardioversion earlier this morning. On Xarelto PTA - last dose on 6/26 in PM. Plan for possible [facility] during admission.  No s/sx of bleeding. CBC pending. Scr 1.83 (normCrCl 41 mL/min). Given timing of last Xarelto dose, will   Goal of Therapy:  Heparin level 0.3-0.7 units/ml aPTT 66-102 seconds Monitor platelets by anticoagulation protocol: Yes   Plan:  Start heparin infusion at 1600 units/hr tonight at 2100 (timing of next dose due for Xarelto) Check anti-Xa level in 6 hours and daily while on heparin Continue to monitor H&H and platelets  Girard Cooter, PharmD Clinical Pharmacist  Pager: [phone] Phone: [phone] 09/14/2017,3:38 PM

## 2017-09-14 NOTE — Progress Notes (Signed)
Primary Care Physician: Renaye Rakers, MD Primary Cardiologist: previously Fredrich Birks [facility]) Primary Electrophysiologist: previously Hurman Horn [facility]) Referring Physician: ER/self   [name] is a 67 y.o. male with a history of persistent atrial fibrillation status post convergent ablation at [facility] with Dr Hurman Horn in 2012 who presented for consultation in the [facility] Atrial Fibrillation Clinic 6/11 for worsening symptoms of edema and shortness of breath.. He was initially diagnosed with atrial fibrillation about 9 years ago and was followed by [facility] at that time.  He was referred to [facility] for consideration of ablation and underwent a convergent AF ablation by Dr Hurman Horn. Post op, he had pericardial effusion which required draining.  He also has previously had cardiomyopathy and was on Entresto, Metoprolol, Lasix, and Xarelto.  He has been followed by Dr Parke Simmers.  In March of this year, he presented to the ER with worsening shortness of breath and fatigue. He was found to be in atrial flutter and cardioverted. He was scheduled for follow up in the AF clinic but cancelled that appointment because he was feeling better.  A couple of months ago he ran out of all of his medications.  Since that time, he has had weight gain, LE edema, shortness of breath, and fatigue. He called requesting to be seen in follow up. He was found to be very fluid overloaded x 40 lbs and was diuresed as an outpatient, subsequent 30 lb weight loss. He was in rapid flutter with v rates of 150 bpm. He went on to have successful TEE guided cardioversion but TEE showed EF at 15-20%. He presented to the afib waiting room today not feeling well with lightheadedness. He was not orthostatic, with pulse ox of 94%, but weight was up 11 lbs(lasix reduced to 40 mg bid from 80 mg bid last week) Bmet showed increase of creatinine to 1.83 ( from 1.45) and , K+ at 3.2, BNP at 1006.9 ( up from 508.4). I discussed with Dr. Kittie Plater who thought  pt shold be admitted for diuresis/ further HF treatment and spoke to Dr. Rennis Golden who will accept pt who will be going to [facility].   Atrial Fibrillation Risk Factors:  he does not have symptoms or diagnosis of sleep apnea.  he does not have a history of rheumatic fever.  he does not have a history of alcohol use.  he has a BMI of Body mass index is 35.86 kg/m.Marland Kitchen Filed Weights   09/14/17 1121  Weight: 294 lb 9.6 oz (133.6 kg)     Atrial Fibrillation Management history:  Previous antiarrhythmic drugs: Tikosyn - 2013, stopped at unknown time - pt does not remember when it was discontinued. Allergy to amiodarone listed, pt unsure of reaction  Previous ablations: 2012 - Dr Hurman Horn, [facility], convergent procedure  [facility] score: 3  Anticoagulation history: Warfarin ->Xarelto 2014, now off OAC for 2 months   Today, denies symptoms of palpitations, chest pain, shortness of breath, orthopnea, PND, lower extremity edema, claudication, dizziness, presyncope, syncope, bleeding, or neurologic sequela. The patient is tolerating medications without difficulties.  He is feeling much better since last being seen.  His heart rate still remains elevated and he does have palpitations.  He has lost 15 pounds since being diuresed.  His respiratory status is improved since diuresis.   Past Medical History:  Diagnosis Date  . Atrial tachycardia (HCC)   . Cardiomyopathy (HCC)   . Hypertension   . Mitral regurgitation   . Paroxysmal atrial flutter (HCC)   .  Persistent atrial fibrillation [facility])    Past Surgical History:  Procedure Laterality Date  . CARDIAC ELECTROPHYSIOLOGY STUDY AND ABLATION  2012   Dr. Riccardo Dubin  . CARDIOVERSION N/A 09/12/2017   Procedure: CARDIOVERSION;  Surgeon: Jake Bathe, MD;  Location: [facility] ENDOSCOPY;  Service: Cardiovascular;  Laterality: N/A;  . LOOP RECORDER IMPLANT  06/11/2010  . TEE WITHOUT CARDIOVERSION N/A 09/12/2017   Procedure: TRANSESOPHAGEAL ECHOCARDIOGRAM (TEE);   Surgeon: Jake Bathe, MD;  Location: [facility] ENDOSCOPY;  Service: Cardiovascular;  Laterality: N/A;    Current Outpatient Medications  Medication Sig Dispense Refill  . furosemide (LASIX) 80 MG tablet Take 0.5 tablets (40 mg total) by mouth 2 (two) times daily. (Patient taking differently: Take 40 mg by mouth daily. ) 60 tablet 3  . metoprolol tartrate (LOPRESSOR) 50 MG tablet Take 1 tablet (50 mg total) by mouth 2 (two) times daily. 60 tablet 3  . rivaroxaban (XARELTO) 20 MG TABS tablet Take 1 tablet (20 mg total) by mouth daily with supper. 30 tablet 6   No current facility-administered medications for this encounter.     Allergies  Allergen Reactions  . Amiodarone Itching and Rash    Social History   Socioeconomic History  . Marital status: Widowed    Spouse name: Not on file  . Number of children: Not on file  . Years of education: Not on file  . Highest education level: Not on file  Occupational History  . Not on file  Social Needs  . Financial resource strain: Not on file  . Food insecurity:    Worry: Not on file    Inability: Not on file  . Transportation needs:    Medical: Not on file    Non-medical: Not on file  Tobacco Use  . Smoking status: Never Smoker  . Smokeless tobacco: Never Used  Substance and Sexual Activity  . Alcohol use: Yes    Comment: rare  . Drug use: Not on file  . Sexual activity: Not on file  Lifestyle  . Physical activity:    Days per week: Not on file    Minutes per session: Not on file  . Stress: Not on file  Relationships  . Social connections:    Talks on phone: Not on file    Gets together: Not on file    Attends religious service: Not on file    Active member of club or organization: Not on file    Attends meetings of clubs or organizations: Not on file    Relationship status: Not on file  . Intimate partner violence:    Fear of current or ex partner: Not on file    Emotionally abused: Not on file    Physically abused: Not on  file    Forced sexual activity: Not on file  Other Topics Concern  . Not on file  Social History Narrative  . Not on file   ROS:  Please see the history of present illness.   Otherwise, review of systems is positive for none.   All other systems are reviewed and negative.   PHYSICAL EXAM: VS:  BP 116/74   Pulse 72   Ht 6\' 4"  (1.93 m)   Wt 294 lb 9.6 oz (133.6 kg)   SpO2 95%   BMI 35.86 kg/m  , BMI Body mass index is 35.86 kg/m. GEN: Well nourished, well developed, in no acute distress  HEENT: normal  Neck: no JVD, carotid bruits, or masses Cardiac: Regular, with PC's  ;  no murmurs, rubs, or gallops,no edema  Respiratory:  clear to auscultation bilaterally, normal work of breathing GI: soft, nontender, nondistended, + BS MS: no deformity or atrophy  Skin: warm and dry Neuro:  Strength and sensation are intact Psych: euthymic mood, full affect  EKG:  EKG is ordered today. EKG shows Sinus brady at 72 bpm, pr int 176 ms, qrs int 102, qtc 501 ms, with PC's  Epic records reviewed today  Assessment and Plan:  1. Persistent atrial fibrillation/atypical flutter Successful cardioversion to SR 6/25 TEE showed severely reduced EF of 15 %   This patients CHA2DS2-VASc Score and unadjusted Ischemic Stroke Rate (% per year) is equal to 3.2 % stroke rate/year from a score of 3  Above score calculated as 1 point each if present [CHF, HTN, DM, Vascular=MI/PAD/Aortic Plaque, Age if 65-74, or Male] Above score calculated as 2 points each if present [Age > 75, or Stroke/TIA/TE]  2.  Acute on chronic systolic heart failure Thinks his dry weight is around 280 pounds, he currently is at 294 lbs..  He initially presented   at 315 pounds. He will be admitted today for increase of 11 lbs since last visit( with reduction of lasix)  and worsening renal function and increase in BNP  3.  HTN Blood pressure well controlled today at 116/74.   Standing BP 110/72  4.  Obesity Body mass index is  35.86 kg/m. weight loss encouraged  Lupita Leash C. [facility] [address] [place], [place] [address] [phone]

## 2017-09-15 DIAGNOSIS — N179 Acute kidney failure, unspecified: Secondary | ICD-10-CM

## 2017-09-15 DIAGNOSIS — I480 Paroxysmal atrial fibrillation: Secondary | ICD-10-CM

## 2017-09-15 LAB — COOXEMETRY PANEL
CARBOXYHEMOGLOBIN: 1.5 % (ref 0.5–1.5)
METHEMOGLOBIN: 1.5 % (ref 0.0–1.5)
O2 SAT: 74.7 %
Total hemoglobin: 12.5 g/dL (ref 12.0–16.0)

## 2017-09-15 LAB — APTT
APTT: 68 s — AB (ref 24–36)
aPTT: 107 seconds — ABNORMAL HIGH (ref 24–36)
aPTT: 97 seconds — ABNORMAL HIGH (ref 24–36)

## 2017-09-15 LAB — BASIC METABOLIC PANEL
ANION GAP: 8 (ref 5–15)
BUN: 19 mg/dL (ref 8–23)
CALCIUM: 8.7 mg/dL — AB (ref 8.9–10.3)
CHLORIDE: 104 mmol/L (ref 98–111)
CO2: 29 mmol/L (ref 22–32)
Creatinine, Ser: 1.66 mg/dL — ABNORMAL HIGH (ref 0.61–1.24)
GFR calc non Af Amer: 41 mL/min — ABNORMAL LOW (ref >60)
GFR, EST AFRICAN AMERICAN: 48 mL/min — AB (ref >60)
GLUCOSE: 111 mg/dL — AB (ref 70–99)
Potassium: 3.6 mmol/L (ref 3.5–5.1)
Sodium: 141 mmol/L (ref 135–145)

## 2017-09-15 LAB — HEPARIN LEVEL (UNFRACTIONATED)
Heparin Unfractionated: 1.12 IU/mL — ABNORMAL HIGH (ref 0.30–0.70)
Heparin Unfractionated: 0.6 IU/mL (ref 0.30–0.70)

## 2017-09-15 LAB — CBC
HCT: 35.9 % — ABNORMAL LOW (ref 39.0–52.0)
HEMOGLOBIN: 11.5 g/dL — AB (ref 13.0–17.0)
MCH: 30.4 pg (ref 26.0–34.0)
MCHC: 32 g/dL (ref 30.0–36.0)
MCV: 95 fL (ref 78.0–100.0)
PLATELETS: 108 10*3/uL — AB (ref 150–400)
RBC: 3.78 MIL/uL — AB (ref 4.22–5.81)
RDW: 14.9 % (ref 11.5–15.5)
WBC: 4.8 10*3/uL (ref 4.0–10.5)

## 2017-09-15 LAB — MAGNESIUM: MAGNESIUM: 1.7 mg/dL (ref 1.7–2.4)

## 2017-09-15 LAB — HIV ANTIBODY (ROUTINE TESTING W REFLEX): HIV Screen 4th Generation wRfx: NONREACTIVE

## 2017-09-15 NOTE — Progress Notes (Signed)
   09/15/17 1200  Clinical Encounter Type  Visited With Patient  Visit Type Initial;Spiritual support  Referral From Nurse;Patient  Consult/Referral To Chaplain  Spiritual Encounters  Spiritual Needs Prayer   Responded to a SCC for prayer.  Patient welcoming of the visit.  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  Patient's wife died 2 years ago.  Patient has good support system. We prayed together.  Will follow and support as needed. Chaplain Agustin Cree

## 2017-09-15 NOTE — Progress Notes (Signed)
ANTICOAGULATION CONSULT NOTE   Pharmacy Consult for heparin Indication: atrial fibrillation  Allergies  Allergen Reactions  . Amiodarone Itching and Rash    Patient Measurements: Height: 6\' 4"  (193 cm) Weight: 292 lb 11.2 oz (132.8 kg) IBW/kg (Calculated) : 86.8 Heparin Dosing Weight: 115 kg  Vital Signs: Temp: 98 F (36.7 C) (06/28 0055) Temp Source: Oral (06/28 0055) BP: 112/74 (06/28 0055) Pulse Rate: 66 (06/28 0055)  Labs: Recent Labs    09/14/17 1155 09/14/17 1534 09/15/17 0300  HGB  --  11.5* 11.5*  HCT  --  36.2* 35.9*  PLT  --  114* 108*  APTT  --   --  68*  CREATININE 1.83*  --  1.66*    Estimated Creatinine Clearance: 65.1 mL/min (A) (by C-G formula based on SCr of 1.66 mg/dL (H)).   Assessment: 80 yom with history of afib who underwent successful TEE cardioversion earlier this morning. On Xarelto PTA - last dose on 6/26 in PM. Plan for possible [facility] during admission.  APTT this am therapeutic  Goal of Therapy:  Heparin level 0.3-0.7 units/ml aPTT 66-102 seconds Monitor platelets by anticoagulation protocol: Yes   Plan:  Continmue heparin infusion at 1600 units/hr  Check anti-Xa level in 6 hours and daily while on heparin Continue to monitor H&H and platelets  Talbert Cage, PharmD Clinical Pharmacist  09/15/2017,4:31 AM

## 2017-09-15 NOTE — Care Management Note (Signed)
Case Management Note  Patient Details  Name: [name] MRN: [id number] Date of Birth: April 29, 1950  Subjective/Objective:    CHF               Action/Plan: Primary Care Provider: Renaye Rakers, MD; has private insurance with [employer] / prescription drug coverage; on xarelto at home; for possible heart cath. CM will continue to follow for progression of care.  Expected Discharge Date:     Possible 09/19/2017             Expected Discharge Plan:  Home/Self Care  Discharge planning Services  CM Consult  Status of Service:  In process, will continue to follow  Reola Mosher [phone] 09/15/2017, 11:07 AM

## 2017-09-15 NOTE — Progress Notes (Signed)
ANTICOAGULATION CONSULT NOTE   Pharmacy Consult for heparin Indication: atrial fibrillation  Allergies  Allergen Reactions  . Amiodarone Itching and Rash    Patient Measurements: Height: 6\' 4"  (193 cm) Weight: 285 lb 14.4 oz (129.7 kg)(scale a) IBW/kg (Calculated) : 86.8 Heparin Dosing Weight: 115 kg  Vital Signs: Temp: 97.9 F (36.6 C) (06/28 1203) Temp Source: Oral (06/28 1203) BP: 105/79 (06/28 1203) Pulse Rate: 81 (06/28 1203)  Labs: Recent Labs    09/14/17 1155 09/14/17 1534 09/15/17 0300 09/15/17 1158  HGB  --  11.5* 11.5*  --   HCT  --  36.2* 35.9*  --   PLT  --  114* 108*  --   APTT  --   --  68* 107*  HEPARINUNFRC  --   --  1.12* 0.60  CREATININE 1.83*  --  1.66*  --     Estimated Creatinine Clearance: 64.4 mL/min (A) (by C-G formula based on SCr of 1.66 mg/dL (H)).   Assessment: 9 yom with history of afib who underwent successful TEE cardioversion earlier this morning. On Xarelto PTA - last dose on 6/26 in PM. Plan for possible [facility] during admission.  APTT above goal range at 107 secs   Goal of Therapy:  Heparin level 0.3-0.7 units/ml aPTT 66-102 seconds Monitor platelets by anticoagulation protocol: Yes   Plan:  Decrease heparin infusion to 1500 units/hr  Check anti-Xa level in 6 hours and daily while on heparin Continue to monitor H&H and platelets  Jeanella Cara, PharmD, [facility] Clinical Pharmacist  09/15/2017,1:02 PM

## 2017-09-15 NOTE — Progress Notes (Signed)
ANTICOAGULATION CONSULT NOTE   Pharmacy Consult for heparin Indication: atrial fibrillation  Allergies  Allergen Reactions  . Amiodarone Itching and Rash    Patient Measurements: Height: 6\' 4"  (193 cm) Weight: 285 lb 14.4 oz (129.7 kg)(scale a) IBW/kg (Calculated) : 86.8 Heparin Dosing Weight: 115 kg  Vital Signs: Temp: 97.9 F (36.6 C) (06/28 1604) Temp Source: Oral (06/28 1604) BP: 100/77 (06/28 1604) Pulse Rate: 43 (06/28 1604)  Labs: Recent Labs    09/14/17 1155 09/14/17 1534 09/15/17 0300 09/15/17 1158 09/15/17 2017  HGB  --  11.5* 11.5*  --   --   HCT  --  36.2* 35.9*  --   --   PLT  --  114* 108*  --   --   APTT  --   --  68* 107* 97*  HEPARINUNFRC  --   --  1.12* 0.60  --   CREATININE 1.83*  --  1.66*  --   --     Estimated Creatinine Clearance: 64.4 mL/min (A) (by C-G formula based on SCr of 1.66 mg/dL (H)).   Assessment: 52 yom with history of afib who underwent successful TEE cardioversion earlier this morning. On Xarelto PTA - last dose on 6/26 in PM. Plan for possible [facility] during admission.  APTT now within goal range at 97s, heparin level pending. No bleeding issues noted, will continue at current rate. Xarelto looks to be almost cleared, will check aptt and heparin level with am labs but likely able to rely on heparin level alone by tomorrow.  Goal of Therapy:  Heparin level 0.3-0.7 units/ml aPTT 66-102 seconds Monitor platelets by anticoagulation protocol: Yes   Plan:  Continue heparin infusion at 1500 units/hr  Check anti-Xa level daily while on heparin Continue to monitor H&H and platelets  Sheppard Coil PharmD., BCPS Clinical Pharmacist 09/15/2017 8:55 PM

## 2017-09-15 NOTE — Consult Note (Addendum)
Advanced Heart Failure Team Consult Note   Primary Physician: Renaye Rakers, MD PCP-Cardiologist:  No primary care provider on file.  Reason for Consultation: A/C systolic HF  HPI:    [name] is seen today for evaluation of A/C systolic HF at the request of Dr Eden Emms.   [name] is a 67 y.o. male with a history of persistent afib (s/p ablation 2012, DCCV 09/12/17), HTN, and chronic systolic HF (at least since 2009).  Seen in afib clinic in 08/29/17 with rapid afib and 40 lb weight gain in setting of medication noncompliance. Diuresed 30 lbs outpatient.    Underwent successful TEE/DCCV on 09/12/17. TEE showed EF 15-20%. His lasix was decreased from 80 mg BID to 40 mg BID due to AKI.  He felt better initially after DCCV, but then became progressively more SOB with exertion. He had weight gain and BLE edema. Denies any CP, dizziness, or palpitations. Appetite and energy level have been good.  He was admitted from Afib clinic on 09/14/17 with 11 lb weight gain and AKI. Pertinent admission labs include: K 3.2, Creatinine 1.83, BNP 1007.  Brisk diuresis with IV lasix with -2.3 L. Down 7 lbs overnight. Creatinine improving. 1.66 this am, K 3.6. Co-ox 75%.  Today he is feeling much better. Denies CP, SOB, palpitations or dizziness. Has not gotten OOB.   No ETOH, tobacco, or drugs. He lives at home with his daughter. Denies problems with transportation or getting medications. His wife passed away 2 years ago.   LHC 06/12/2010 1. Normal coronaries, mild global LV dysfunction, most likely AFib mediated. 2. No mitral regurgitation.  TEE 09/12/17 - Left ventricle: The cavity size was dilated. Systolic function   was severely reduced. The estimated ejection fraction was in the   range of 15% to 20%. Diffuse hypokinesis. - Aortic valve: No evidence of vegetation. - Mitral valve: No evidence of vegetation. There was mild to   moderate regurgitation. - Left atrium: The atrium was  dilated. No evidence of thrombus in   the atrial cavity or appendage. No evidence of thrombus in the   appendage. - Right ventricle: Systolic function was reduced. - Right atrium: The atrium was dilated. No evidence of thrombus in   the atrial cavity or appendage. - Atrial septum: No defect or patent foramen ovale was identified.   There was no right-to-left atrial level shunt, following an   increase in RA pressure induced by provocative maneuvers. - Tricuspid valve: No evidence of vegetation. - Pulmonic valve: No evidence of vegetation.  Review of Systems: [y] = yes, [ ]  = no   General: Weight gain [ y]; Weight loss [ ] ; Anorexia [ ] ; Fatigue [ ] ; Fever [ ] ; Chills [ ] ; Weakness [ ]   Cardiac: Chest pain/pressure [ ] ; Resting SOB [ ] ; Exertional SOB [ y]; Orthopnea [ ] ; Pedal Edema [ y]; Palpitations [ ] ; Syncope [ ] ; Presyncope [ ] ; Paroxysmal nocturnal dyspnea[ ]   Pulmonary: Cough [ ] ; Wheezing[ ] ; Hemoptysis[ ] ; Sputum [ ] ; Snoring [ ]   GI: Vomiting[ ] ; Dysphagia[ ] ; Melena[ ] ; Hematochezia [ ] ; Heartburn[ ] ; Abdominal pain [ ] ; Constipation [ ] ; Diarrhea [ ] ; BRBPR [ ]   GU: Hematuria[ ] ; Dysuria [ ] ; Nocturia[ ]   Vascular: Pain in legs with walking [ ] ; Pain in feet with lying flat [ ] ; Non-healing sores [ ] ; Stroke [ ] ; TIA [ ] ; Slurred speech [ ] ;  Neuro: Headaches[ ] ; Vertigo[ ] ; Seizures[ ] ; Paresthesias[ ] ;Blurred  vision [ ] ; Diplopia [ ] ; Vision changes [ ]   Ortho/Skin: Arthritis [ ] ; Joint pain [ ] ; Muscle pain [ ] ; Joint swelling [ ] ; Back Pain [ ] ; Rash [ ]   Psych: Depression[ ] ; Anxiety[ ]   Heme: Bleeding problems [ ] ; Clotting disorders [ ] ; Anemia [ ]   Endocrine: Diabetes [ ] ; Thyroid dysfunction[ ]   Home Medications Prior to Admission medications   Medication Sig Start Date End Date Taking? Authorizing Provider  furosemide (LASIX) 80 MG tablet Take 0.5 tablets (40 mg total) by mouth 2 (two) times daily. Patient taking differently: Take 80 mg by mouth daily.  09/06/17  09/06/18 Yes Seiler, Amber K, NP  metoprolol tartrate (LOPRESSOR) 50 MG tablet Take 1 tablet (50 mg total) by mouth 2 (two) times daily. 08/29/17  Yes Seiler, Amber K, NP  naproxen sodium (ALEVE) 220 MG tablet Take 440 mg by mouth as needed (pain).   Yes [provider]  rivaroxaban (XARELTO) 20 MG TABS tablet Take 1 tablet (20 mg total) by mouth daily with supper. 08/29/17  Yes Marily Lente, NP    Past Medical History: Past Medical History:  Diagnosis Date  . Atrial tachycardia (HCC)   . Cardiomyopathy (HCC)   . Hypertension   . Mitral regurgitation   . Paroxysmal atrial flutter (HCC)   . Persistent atrial fibrillation [facility])     Past Surgical History: Past Surgical History:  Procedure Laterality Date  . CARDIAC ELECTROPHYSIOLOGY STUDY AND ABLATION  2012   Dr. Riccardo Dubin  . CARDIOVERSION N/A 09/12/2017   Procedure: CARDIOVERSION;  Surgeon: Jake Bathe, MD;  Location: [facility] ENDOSCOPY;  Service: Cardiovascular;  Laterality: N/A;  . LOOP RECORDER IMPLANT  06/11/2010  . TEE WITHOUT CARDIOVERSION N/A 09/12/2017   Procedure: TRANSESOPHAGEAL ECHOCARDIOGRAM (TEE);  Surgeon: Jake Bathe, MD;  Location: [facility] ENDOSCOPY;  Service: Cardiovascular;  Laterality: N/A;    Family History: History reviewed. No pertinent family history.  Social History: Social History   Socioeconomic History  . Marital status: Widowed    Spouse name: Not on file  . Number of children: Not on file  . Years of education: Not on file  . Highest education level: Not on file  Occupational History  . Not on file  Social Needs  . Financial resource strain: Not on file  . Food insecurity:    Worry: Not on file    Inability: Not on file  . Transportation needs:    Medical: Not on file    Non-medical: Not on file  Tobacco Use  . Smoking status: Never Smoker  . Smokeless tobacco: Never Used  Substance and Sexual Activity  . Alcohol use: Yes    Comment: rare  . Drug use: Never  . Sexual activity:  Not on file  Lifestyle  . Physical activity:    Days per week: Not on file    Minutes per session: Not on file  . Stress: Not on file  Relationships  . Social connections:    Talks on phone: Not on file    Gets together: Not on file    Attends religious service: Not on file    Active member of club or organization: Not on file    Attends meetings of clubs or organizations: Not on file    Relationship status: Not on file  Other Topics Concern  . Not on file  Social History Narrative  . Not on file    Allergies:  Allergies  Allergen Reactions  . Amiodarone Itching  and Rash    Objective:    Vital Signs:   Temp:  [97.5 F (36.4 C)-99.4 F (37.4 C)] 99.4 F (37.4 C) (06/28 0839) Pulse Rate:  [66-84] 72 (06/28 0839) Resp:  [18] 18 (06/28 0839) BP: (103-125)/(72-89) 120/79 (06/28 0839) SpO2:  [93 %-100 %] 93 % (06/28 0839) Weight:  [285 lb 14.4 oz (129.7 kg)-292 lb 11.2 oz (132.8 kg)] 285 lb 14.4 oz (129.7 kg) (06/28 0508) Last BM Date: 09/14/17  Weight change: Filed Weights   09/14/17 1321 09/15/17 0508  Weight: 292 lb 11.2 oz (132.8 kg) 285 lb 14.4 oz (129.7 kg)    Intake/Output:   Intake/Output Summary (Last 24 hours) at 09/15/2017 1031 Last data filed at 09/15/2017 0959 Gross per 24 hour  Intake 841.5 ml  Output 3375 ml  Net -2533.5 ml      Physical Exam    General:  Well appearing. No resp difficulty HEENT: normal Neck: supple. JVP ~8. Carotids 2+ bilat; no bruits. No lymphadenopathy or thyromegaly appreciated. Cor: PMI nondisplaced. Regular rate & rhythm. No rubs, gallops or murmurs. Lungs: clear Abdomen: soft, nontender, nondistended. No hepatosplenomegaly. No bruits or masses. Good bowel sounds. Extremities: no cyanosis, clubbing, rash, BLE 2+ edema. RUE PICC Neuro: alert & orientedx3, cranial nerves grossly intact. moves all 4 extremities w/o difficulty. Affect pleasant   Telemetry   Sinus with sinus arrhythmia 50-130 with PVCs and PACs.  Personally reviewed.   EKG    Sinus rhythm with PACs, 72 bpm.   Labs   Basic Metabolic Panel: Recent Labs  Lab 09/14/17 1155 09/15/17 0300  NA 140 141  K 3.2* 3.6  CL 105 104  CO2 26 29  GLUCOSE 138* 111*  BUN 18 19  CREATININE 1.83* 1.66*  CALCIUM 8.6* 8.7*    Liver Function Tests: Recent Labs  Lab 09/14/17 1155  AST 25  ALT 20  ALKPHOS 56  BILITOT 0.8  PROT 6.4*  ALBUMIN 3.1*   No results for input(s): LIPASE, AMYLASE in the last 168 hours. No results for input(s): AMMONIA in the last 168 hours.  CBC: Recent Labs  Lab 09/14/17 1534 09/15/17 0300  WBC 5.2 4.8  HGB 11.5* 11.5*  HCT 36.2* 35.9*  MCV 95.3 95.0  PLT 114* 108*    Cardiac Enzymes: No results for input(s): CKTOTAL, CKMB, CKMBINDEX, TROPONINI in the last 168 hours.  BNP: BNP (last 3 results) Recent Labs    09/06/17 0902 09/14/17 1155  BNP 508.4* 1,006.9*    ProBNP (last 3 results) No results for input(s): PROBNP in the last 8760 hours.   CBG: No results for input(s): GLUCAP in the last 168 hours.  Coagulation Studies: No results for input(s): LABPROT, INR in the last 72 hours.   Imaging   [place] Ekg Site Rite  Result Date: 09/14/2017 If Site Rite image not attached, placement could not be confirmed due to current cardiac rhythm.     Medications:     Current Medications: . aspirin EC  81 mg Oral Daily  . furosemide  80 mg Intravenous Daily  . metoprolol tartrate  50 mg Oral BID  . potassium chloride  40 mEq Oral BID     Infusions: . heparin 1,600 Units/hr (09/14/17 2105)       Patient Profile   THIERRY DOBOSZ is a 67 y.o. male with a history of persistent afib (s/p ablation 2012, DCCV 09/12/17), HTN, and chronic systolic HF.   Admitted for volume overload from Afib clinic.   Assessment/Plan  1. A/C systolic HF. EF 25% as far back as 2009. LHC 2012: normal coronaries.  - Echo 09/12/17: EF 15-20%, diffuse HK, mild to mod MR - Volume status improving. Does  not appear low output.  - Continue 80 mg IV lasix BID - Consider switching lopressor to Toprol XL - Consider dig, spiro, and losartan. SBP 100-120s - Co-ox 75%. Monitor CVP - Add TED hose  2. Persistent Afib s/p ablation in 2012 and DCCV 6/25 - Xarelto on hold for possible cath.  - Continue Heparin drip - He appears to be in NSR, but HR is 50-130s. Check EKG. Add on mag.   3. HTN - Stable. SBP 100-120s  4. AKI on CKD 3 - Monitor daily BMET - Creatinine improving. 1.66 this am.    Medication concerns reviewed with patient and pharmacy team. Barriers identified: none  Length of Stay: 1  Alford Highland, NP  09/15/2017, 10:31 AM  Advanced Heart Failure Team Pager [phone] (M-F; 7a - 4p)  Please contact [facility] for night-coverage after hours (4p -7a ) and weekends on [email or web address]  Patient seen and examined with the above-signed Advanced Practice Provider and/or Housestaff. I personally reviewed laboratory data, imaging studies and relevant notes. I independently examined the patient and formulated the important aspects of the plan. I have edited the note to reflect any of my changes or salient points. I have personally discussed the plan with the patient and/or family.  67 y/o male with longstanding NICM EF ~25%. Recently struggling with AF again (had ablation in 2012) and underwent DC-CV earlier this week. Post DC-CV lasix dose decreased. Presented to AF clinic with worsening edema and HF symptoms with bump in creatinine 1.4-> 1.8. Now responding well to IV lasix. Creatinine improving.   On exam  JVP to jaw RRR no s3 Lungs CTA no wheeze Ab nondistended Ext: 2-3+ edema  Currently responding well to IV lasix. Does not appear to be low output. Will continue IV diuresis. Adjust HF meds. Maintain NSR. On heparin currently (Xarelto on hold). Having frequent atrial ectopy. I think he will need AA therapy. Will d/w with EP in am. Previously on Tikosyn but likely not good option with  CKD. Carries allergy to amio as "itching and rash" but he does not remember this.  Arvilla Meres, MD  10:30 PM

## 2017-09-15 NOTE — Progress Notes (Signed)
Katrinka Blazing NP requesting coox drawn daily starting now, placed verbal order  Called RT to draw, Viviann Spare stated RT can only draw arterial not through lines NP aware  Placed DBIV order for IV team  Primary RN aware

## 2017-09-15 NOTE — Progress Notes (Signed)
Report called to Rich Brave, 6E RN.  Patient being transferred to 6E22.  Daughter and patient update on plan of care.

## 2017-09-16 LAB — BASIC METABOLIC PANEL
Anion gap: 11 (ref 5–15)
BUN: 16 mg/dL (ref 8–23)
CHLORIDE: 103 mmol/L (ref 98–111)
CO2: 27 mmol/L (ref 22–32)
CREATININE: 1.37 mg/dL — AB (ref 0.61–1.24)
Calcium: 8.6 mg/dL — ABNORMAL LOW (ref 8.9–10.3)
GFR calc non Af Amer: 52 mL/min — ABNORMAL LOW (ref >60)
Glucose, Bld: 93 mg/dL (ref 70–99)
POTASSIUM: 3.8 mmol/L (ref 3.5–5.1)
Sodium: 141 mmol/L (ref 135–145)

## 2017-09-16 LAB — CBC
HCT: 37.9 % — ABNORMAL LOW (ref 39.0–52.0)
Hemoglobin: 12 g/dL — ABNORMAL LOW (ref 13.0–17.0)
MCH: 30.4 pg (ref 26.0–34.0)
MCHC: 31.7 g/dL (ref 30.0–36.0)
MCV: 95.9 fL (ref 78.0–100.0)
PLATELETS: 111 10*3/uL — AB (ref 150–400)
RBC: 3.95 MIL/uL — ABNORMAL LOW (ref 4.22–5.81)
RDW: 14.8 % (ref 11.5–15.5)
WBC: 5 10*3/uL (ref 4.0–10.5)

## 2017-09-16 LAB — COOXEMETRY PANEL
Carboxyhemoglobin: 1.6 % — ABNORMAL HIGH (ref 0.5–1.5)
Methemoglobin: 1.3 % (ref 0.0–1.5)
O2 SAT: 62.7 %
TOTAL HEMOGLOBIN: 12.1 g/dL (ref 12.0–16.0)

## 2017-09-16 LAB — HEPARIN LEVEL (UNFRACTIONATED): Heparin Unfractionated: 0.54 IU/mL (ref 0.30–0.70)

## 2017-09-16 LAB — APTT: aPTT: 116 seconds — ABNORMAL HIGH (ref 24–36)

## 2017-09-16 MED ORDER — METOPROLOL SUCCINATE ER 100 MG PO TB24
100.0000 mg | ORAL_TABLET | Freq: Every day | ORAL | Status: DC
  Administered 2017-09-16 – 2017-09-17 (×2): 100 mg via ORAL
  Filled 2017-09-16 (×2): qty 1

## 2017-09-16 MED ORDER — RIVAROXABAN 20 MG PO TABS
20.0000 mg | ORAL_TABLET | Freq: Every day | ORAL | Status: DC
  Administered 2017-09-16 – 2017-09-17 (×2): 20 mg via ORAL
  Filled 2017-09-16 (×2): qty 1

## 2017-09-16 MED ORDER — SACUBITRIL-VALSARTAN 24-26 MG PO TABS
1.0000 | ORAL_TABLET | Freq: Two times a day (BID) | ORAL | Status: DC
  Administered 2017-09-16 – 2017-09-18 (×4): 1 via ORAL
  Filled 2017-09-16 (×4): qty 1

## 2017-09-16 NOTE — Progress Notes (Signed)
ANTICOAGULATION CONSULT NOTE   Pharmacy Consult for heparin Indication: atrial fibrillation  Allergies  Allergen Reactions  . Amiodarone Itching and Rash    Patient Measurements: Height: 6\' 4"  (193 cm) Weight: 275 lb 9.6 oz (125 kg) IBW/kg (Calculated) : 86.8 Heparin Dosing Weight: 115 kg  Vital Signs: Temp: 98 F (36.7 C) (06/29 0809) Temp Source: Oral (06/29 0809) BP: 120/81 (06/29 0809) Pulse Rate: 56 (06/29 0809)  Labs: Recent Labs    09/14/17 1155  09/14/17 1534 09/15/17 0300 09/15/17 1158 09/15/17 2017 09/16/17 0500  HGB  --    < > 11.5* 11.5*  --   --  12.0*  HCT  --   --  36.2* 35.9*  --   --  37.9*  PLT  --   --  114* 108*  --   --  111*  APTT  --   --   --  68* 107* 97*  --   HEPARINUNFRC  --   --   --  1.12* 0.60  --  0.54  CREATININE 1.83*  --   --  1.66*  --   --  1.37*   < > = values in this interval not displayed.   Estimated Creatinine Clearance: 76.6 mL/min (A) (by C-G formula based on SCr of 1.37 mg/dL (H)).  Assessment: 50 yom with history of afib who underwent successful TEE cardioversion 6/27. On Xarelto PTA - last dose on 6/26 in PM. Plan for possible [facility] during admission.  Heparin level therapeutic: 0.54 - No bleeding issues noted, will continue at current rate. CBC stable with no overt bleeding noted.  Goal of Therapy:  Heparin level 0.3-0.7 units/ml aPTT 66-102 seconds Monitor platelets by anticoagulation protocol: Yes   Plan:  Continue heparin infusion at 1500 units/hr  Check anti-Xa level daily while on heparin Continue to monitor H&H and platelets  Ruben Im, PharmD Clinical Pharmacist 09/16/2017 11:31 AM Please check AMION for all [facility] numbers

## 2017-09-16 NOTE — Progress Notes (Addendum)
Advanced Heart Failure Rounding Note   Subjective:    Diuresing well. Weight down 10 pounds overnight (17 pounds total).   Breathing better. No orthopnea or PND. No CP or dizziness.    Creatinine 1.66 -> 1.37. Co-ox 63% CVP 12   Objective:   Weight Range:  Vital Signs:   Temp:  [97.9 F (36.6 C)-98.9 F (37.2 C)] 98 F (36.7 C) (06/29 0809) Pulse Rate:  [43-82] 56 (06/29 0809) Resp:  [20-22] 20 (06/29 0809) BP: (100-120)/(67-91) 120/81 (06/29 0809) SpO2:  [96 %-99 %] 97 % (06/29 0809) Weight:  [125 kg (275 lb 9.6 oz)] 125 kg (275 lb 9.6 oz) (06/29 0537) Last BM Date: 09/16/17  Weight change: Filed Weights   09/14/17 1321 09/15/17 0508 09/16/17 0537  Weight: 132.8 kg (292 lb 11.2 oz) 129.7 kg (285 lb 14.4 oz) 125 kg (275 lb 9.6 oz)    Intake/Output:   Intake/Output Summary (Last 24 hours) at 09/16/2017 1534 Last data filed at 09/16/2017 1353 Gross per 24 hour  Intake 1527.49 ml  Output 4925 ml  Net -3397.51 ml     Physical Exam: General:  Well appearing. No resp difficulty HEENT: normal Neck: supple. JVP 12 . Carotids 2+ bilat; no bruits. No lymphadenopathy or thryomegaly appreciated. Cor: PMI laterally displaced. Regular rate & rhythm. No rubs, gallops or murmurs. Lungs: clear Abdomen: soft, nontender, nondistended. No hepatosplenomegaly. No bruits or masses. Good bowel sounds. Extremities: no cyanosis, clubbing, rash, 1+ edema ted hose Neuro: alert & orientedx3, cranial nerves grossly intact. moves all 4 extremities w/o difficulty. Affect pleasant  Telemetry: Sinus 60-80s with PVCs Personally reviewed   Labs: Basic Metabolic Panel: Recent Labs  Lab 09/14/17 1155 09/15/17 0300 09/15/17 1842 09/16/17 0500  NA 140 141  --  141  K 3.2* 3.6  --  3.8  CL 105 104  --  103  CO2 26 29  --  27  GLUCOSE 138* 111*  --  93  BUN 18 19  --  16  CREATININE 1.83* 1.66*  --  1.37*  CALCIUM 8.6* 8.7*  --  8.6*  MG  --   --  1.7  --     Liver Function  Tests: Recent Labs  Lab 09/14/17 1155  AST 25  ALT 20  ALKPHOS 56  BILITOT 0.8  PROT 6.4*  ALBUMIN 3.1*   No results for input(s): LIPASE, AMYLASE in the last 168 hours. No results for input(s): AMMONIA in the last 168 hours.  CBC: Recent Labs  Lab 09/14/17 1534 09/15/17 0300 09/16/17 0500  WBC 5.2 4.8 5.0  HGB 11.5* 11.5* 12.0*  HCT 36.2* 35.9* 37.9*  MCV 95.3 95.0 95.9  PLT 114* 108* 111*    Cardiac Enzymes: No results for input(s): CKTOTAL, CKMB, CKMBINDEX, TROPONINI in the last 168 hours.  BNP: BNP (last 3 results) Recent Labs    09/06/17 0902 09/14/17 1155  BNP 508.4* 1,006.9*    ProBNP (last 3 results) No results for input(s): PROBNP in the last 8760 hours.    Other results:  Imaging:  No results found.   Medications:     Scheduled Medications: . aspirin EC  81 mg Oral Daily  . furosemide  80 mg Intravenous Daily  . metoprolol tartrate  50 mg Oral BID  . potassium chloride  40 mEq Oral BID     Infusions: . heparin 1,500 Units/hr (09/16/17 0700)     PRN Medications:  acetaminophen, nitroGLYCERIN, ondansetron (ZOFRAN) IV, sodium chloride flush  Assessment:    [name] is a 67 y.o. male with a history of persistent afib (s/p ablation 2012, DCCV 09/12/17), HTN, and chronic systolic HF.   Admitted for volume overload from Afib clinic.    Plan/Discussion:    1. A/C systolic HF. EF 25% as far back as 2009. LHC 2012: normal coronaries.  - Echo 09/12/17: EF 15-20%, diffuse HK, mild to mod MR - Volume status improving. Weight down 17 pounds. CVP still up. CO-ox ok. Does not appear low output.  - Continue 80 mg IV lasix BID 1-2 more days - Will switch lopressor to Toprol XL with low EF  - Creatinine improved. Add Entresto 24/26 - Continue TED hose  2. Persistent Afib s/p ablation in 2012 and DCCV 6/25 - Remains in sinus - Xarelto on hold for possible cath. Likely doesn't need. Stop heparin. Restart Xarelto - Can stop ASA  with Xarelto   3. HTN - BP well controlled  4. AKI on CKD 3 - Monitor daily BMET - Creatinine improving. 1.66-> 1.37 this am.   5. Hypokalemia - K 3.8. Will supp    Length of Stay: 2   Arvilla Meres MD 09/16/2017, 3:34 PM  Advanced Heart Failure Team Pager [phone] (M-F; 7a - 4p)  Please contact [facility] for night-coverage after hours (4p -7a ) and weekends on [email or web address]

## 2017-09-17 LAB — CBC
HCT: 38.7 % — ABNORMAL LOW (ref 39.0–52.0)
HEMOGLOBIN: 12.4 g/dL — AB (ref 13.0–17.0)
MCH: 30.4 pg (ref 26.0–34.0)
MCHC: 32 g/dL (ref 30.0–36.0)
MCV: 94.9 fL (ref 78.0–100.0)
Platelets: 113 10*3/uL — ABNORMAL LOW (ref 150–400)
RBC: 4.08 MIL/uL — AB (ref 4.22–5.81)
RDW: 14.9 % (ref 11.5–15.5)
WBC: 5.1 10*3/uL (ref 4.0–10.5)

## 2017-09-17 LAB — COOXEMETRY PANEL
CARBOXYHEMOGLOBIN: 1.8 % — AB (ref 0.5–1.5)
METHEMOGLOBIN: 0.9 % (ref 0.0–1.5)
O2 Saturation: 64.1 %
Total hemoglobin: 12.8 g/dL (ref 12.0–16.0)

## 2017-09-17 LAB — BASIC METABOLIC PANEL
Anion gap: 6 (ref 5–15)
BUN: 18 mg/dL (ref 8–23)
CHLORIDE: 104 mmol/L (ref 98–111)
CO2: 30 mmol/L (ref 22–32)
CREATININE: 1.38 mg/dL — AB (ref 0.61–1.24)
Calcium: 8.8 mg/dL — ABNORMAL LOW (ref 8.9–10.3)
GFR calc non Af Amer: 52 mL/min — ABNORMAL LOW (ref >60)
GFR, EST AFRICAN AMERICAN: 60 mL/min — AB (ref >60)
Glucose, Bld: 89 mg/dL (ref 70–99)
Potassium: 3.9 mmol/L (ref 3.5–5.1)
SODIUM: 140 mmol/L (ref 135–145)

## 2017-09-17 NOTE — Progress Notes (Signed)
Advanced Heart Failure Rounding Note   Subjective:    Continues to diurese well. Weight down another 5 pounds overnight (22 pounds total).   Feels good. Denies SOB, orthopnea or PND. Ambulating room. Entresto 24/26 started yesterday   Creatinine 1.66 -> 1.37 -> 1.38 . Co-ox 64% CVP 3   Objective:   Weight Range:  Vital Signs:   Temp:  [98 F (36.7 C)-98.3 F (36.8 C)] 98 F (36.7 C) (06/30 0552) Pulse Rate:  [57-75] 75 (06/30 0552) Resp:  [20-21] 20 (06/30 0552) BP: (95-128)/(65-95) 128/95 (06/30 0552) SpO2:  [97 %-99 %] 99 % (06/30 0552) Weight:  [122.8 kg (270 lb 11.2 oz)] 122.8 kg (270 lb 11.2 oz) (06/30 0552) Last BM Date: 09/16/17  Weight change: Filed Weights   09/15/17 0508 09/16/17 0537 09/17/17 0552  Weight: 129.7 kg (285 lb 14.4 oz) 125 kg (275 lb 9.6 oz) 122.8 kg (270 lb 11.2 oz)    Intake/Output:   Intake/Output Summary (Last 24 hours) at 09/17/2017 1453 Last data filed at 09/17/2017 1417 Gross per 24 hour  Intake 960 ml  Output 4675 ml  Net -3715 ml     Physical Exam: General:  Well appearing. No resp difficulty HEENT: normal Neck: supple. No JVD. Carotids 2+ bilat; no bruits. No lymphadenopathy or thryomegaly appreciated. Cor: PMI laterally displaced. Regular rate & rhythm. No rubs, gallops or murmurs. Lungs: clear Abdomen: soft, nontender, nondistended. No hepatosplenomegaly. No bruits or masses. Good bowel sounds. Extremities: no cyanosis, clubbing, rash, edema Neuro: alert & orientedx3, cranial nerves grossly intact. moves all 4 extremities w/o difficulty. Affect pleasant   Telemetry: Sinus 70s with PVCs Personally reviewed   Labs: Basic Metabolic Panel: Recent Labs  Lab 09/14/17 1155 09/15/17 0300 09/15/17 1842 09/16/17 0500 09/17/17 0500  NA 140 141  --  141 140  K 3.2* 3.6  --  3.8 3.9  CL 105 104  --  103 104  CO2 26 29  --  27 30  GLUCOSE 138* 111*  --  93 89  BUN 18 19  --  16 18  CREATININE 1.83* 1.66*  --  1.37*  1.38*  CALCIUM 8.6* 8.7*  --  8.6* 8.8*  MG  --   --  1.7  --   --     Liver Function Tests: Recent Labs  Lab 09/14/17 1155  AST 25  ALT 20  ALKPHOS 56  BILITOT 0.8  PROT 6.4*  ALBUMIN 3.1*   No results for input(s): LIPASE, AMYLASE in the last 168 hours. No results for input(s): AMMONIA in the last 168 hours.  CBC: Recent Labs  Lab 09/14/17 1534 09/15/17 0300 09/16/17 0500 09/17/17 0500  WBC 5.2 4.8 5.0 5.1  HGB 11.5* 11.5* 12.0* 12.4*  HCT 36.2* 35.9* 37.9* 38.7*  MCV 95.3 95.0 95.9 94.9  PLT 114* 108* 111* 113*    Cardiac Enzymes: No results for input(s): CKTOTAL, CKMB, CKMBINDEX, TROPONINI in the last 168 hours.  BNP: BNP (last 3 results) Recent Labs    09/06/17 0902 09/14/17 1155  BNP 508.4* 1,006.9*    ProBNP (last 3 results) No results for input(s): PROBNP in the last 8760 hours.    Other results:  Imaging: No results found.   Medications:     Scheduled Medications: . furosemide  80 mg Intravenous Daily  . metoprolol succinate  100 mg Oral QHS  . potassium chloride  40 mEq Oral BID  . rivaroxaban  20 mg Oral Q supper  . sacubitril-valsartan  1 tablet Oral BID    Infusions:   PRN Medications: acetaminophen, nitroGLYCERIN, ondansetron (ZOFRAN) IV, sodium chloride flush   Assessment:    [name] is a 67 y.o. male with a history of persistent afib (s/p ablation 2012, DCCV 09/12/17), HTN, and chronic systolic HF.   Admitted for volume overload from Afib clinic.    Plan/Discussion:    1. A/C systolic HF. EF 25% as far back as 2009. LHC 2012: normal coronaries.  - Echo 09/12/17: EF 15-20%, diffuse HK, mild to mod MR - Volume status improving. Weight down 22 pounds. Co-ox looks good. CVP 3 - Stop IV lasix.  - Lopressor switched to Toprol XL with low EF - Entresto 24/26 added yesterday - Continue TED hose - Can go home tomorrow. Previously on lasix 80 bid and did well. Had fluid accumualtion on 40 bid. Likely d/c on 80 bid  but will need to watch closely for overdiuresis.   2. Persistent Afib s/p ablation in 2012 and DCCV 6/25 - Remains in sinus - Back on Xarelto - Off ASA with Xarelto   3. HTN - BP well controlled. Tolerating Entresto  4. AKI on CKD 3 - Monitor daily BMET - Creatinine improving. 1.66-> 1.37-> 1.38  5. Hypokalemia - k 3.9 Will supp    Length of Stay: 3   Korey Prashad MD 09/17/2017, 2:53 PM  Advanced Heart Failure Team Pager [phone] (M-F; 7a - 4p)  Please contact [facility] for night-coverage after hours (4p -7a ) and weekends on [email or web address]

## 2017-09-18 ENCOUNTER — Telehealth (HOSPITAL_COMMUNITY): Payer: Self-pay | Admitting: *Deleted

## 2017-09-18 LAB — CBC
HEMATOCRIT: 41.9 % (ref 39.0–52.0)
HEMOGLOBIN: 13.4 g/dL (ref 13.0–17.0)
MCH: 30.4 pg (ref 26.0–34.0)
MCHC: 32 g/dL (ref 30.0–36.0)
MCV: 95 fL (ref 78.0–100.0)
Platelets: 128 10*3/uL — ABNORMAL LOW (ref 150–400)
RBC: 4.41 MIL/uL (ref 4.22–5.81)
RDW: 14.7 % (ref 11.5–15.5)
WBC: 4.8 10*3/uL (ref 4.0–10.5)

## 2017-09-18 LAB — BASIC METABOLIC PANEL
Anion gap: 7 (ref 5–15)
BUN: 16 mg/dL (ref 8–23)
CHLORIDE: 102 mmol/L (ref 98–111)
CO2: 30 mmol/L (ref 22–32)
Calcium: 8.7 mg/dL — ABNORMAL LOW (ref 8.9–10.3)
Creatinine, Ser: 1.29 mg/dL — ABNORMAL HIGH (ref 0.61–1.24)
GFR calc non Af Amer: 56 mL/min — ABNORMAL LOW (ref >60)
Glucose, Bld: 93 mg/dL (ref 70–99)
Potassium: 4.1 mmol/L (ref 3.5–5.1)
Sodium: 139 mmol/L (ref 135–145)

## 2017-09-18 LAB — COOXEMETRY PANEL
Carboxyhemoglobin: 1.5 % (ref 0.5–1.5)
METHEMOGLOBIN: 1.4 % (ref 0.0–1.5)
O2 Saturation: 74.5 %
Total hemoglobin: 13.5 g/dL (ref 12.0–16.0)

## 2017-09-18 MED ORDER — ACETAMINOPHEN 325 MG PO TABS: 650.0000 mg | ORAL_TABLET | ORAL | Status: DC | PRN

## 2017-09-18 MED ORDER — SACUBITRIL-VALSARTAN 24-26 MG PO TABS: 1.0000 | ORAL_TABLET | Freq: Two times a day (BID) | ORAL | 3 refills | Status: DC

## 2017-09-18 MED ORDER — POTASSIUM CHLORIDE CRYS ER 20 MEQ PO TBCR: 20.0000 meq | EXTENDED_RELEASE_TABLET | Freq: Two times a day (BID) | ORAL | 6 refills | Status: DC

## 2017-09-18 MED ORDER — FUROSEMIDE 80 MG PO TABS: ORAL_TABLET | ORAL | 3 refills | Status: DC

## 2017-09-18 MED ORDER — METOPROLOL SUCCINATE ER 100 MG PO TB24: 100.0000 mg | ORAL_TABLET | Freq: Every day | ORAL | 6 refills | Status: DC

## 2017-09-18 MED ORDER — FUROSEMIDE 40 MG PO TABS: 60.0000 mg | ORAL_TABLET | Freq: Two times a day (BID) | ORAL | Status: DC

## 2017-09-18 NOTE — Discharge Summary (Addendum)
Advanced Heart Failure Discharge Note  Discharge Summary   Patient ID: [name] MRN: [id number], DOB/AGE: 1951-01-11 67 y.o. Admit date: 09/14/2017 D/C date:     09/18/2017   Primary Discharge Diagnoses:  1. A/C systolic HF. EF 25% as far back as 2009. LHC 2012: normal coronaries. 2. Persistent Afib s/p ablation in 2012 and DCCV 6/25 3. HTN 4. AKI on CKD 3 5. Hypokalemia  Hospital Course:   [name] is a 67 y.o. male with a history of persistent afib (s/p ablation 2012, DCCV 09/12/17), HTN, and chronic systolic HF.  Admitted with volume overload from Afib clinic 09/14/17. CHF team consulted.   Echo 09/12/17 showed EF 15-20% with diffuse HK and mild/mod MR.      Diuresed on IV lasix. Down a total of 29 lbs this admission. HF meds adjusted as tolerated. No cath pursued with normal coronaries by [facility] 2012. Lopressor switched to Toprol XL for systolic CHF, and Entresto added, which pt tolerated well.    Hospital course complicated by AKI which resolved with diuresis. Hypokalemia resolved with oral supplementation.   Transitioned off IV lasix 09/17/17, and to lasix 80 mg q am and 40 mg q pm for home starting 09/18/17. He was examined am of 09/18/17 and determined stable for home with close discharge as below.   Discharge Weight Range: 263 lbs Discharge Vitals: Blood pressure 131/78, pulse 62, temperature 98.3 F (36.8 C), temperature source Oral, resp. rate (!) 24, height 6\' 4"  (1.93 m), weight 263 lb 3.2 oz (119.4 kg), SpO2 97 %.  Labs: Lab Results  Component Value Date   WBC 4.8 09/18/2017   HGB 13.4 09/18/2017   HCT 41.9 09/18/2017   MCV 95.0 09/18/2017   PLT 128 (L) 09/18/2017    Recent Labs  Lab 09/14/17 1155  09/18/17 0500  NA 140   < > 139  K 3.2*   < > 4.1  CL 105   < > 102  CO2 26   < > 30  BUN 18   < > 16  CREATININE 1.83*   < > 1.29*  CALCIUM 8.6*   < > 8.7*  PROT 6.4*  --   --   BILITOT 0.8  --   --   ALKPHOS 56  --   --   ALT 20  --   --   AST 25   --   --   GLUCOSE 138*   < > 93   < > = values in this interval not displayed.   Lab Results  Component Value Date   CHOL  09/16/2008    138        ATP III CLASSIFICATION:  <200     mg/dL   Desirable  195-093  mg/dL   Borderline High  >=267    mg/dL   High          HDL 27 (L) 09/16/2008   LDLCALC (H) 09/16/2008    104        Total Cholesterol/HDL:CHD Risk Coronary Heart Disease Risk Table                     Men   Women  1/2 Average Risk   3.4   3.3  Average Risk       5.0   4.4  2 X Average Risk   9.6   7.1  3 X Average Risk  23.4   11.0  Use the calculated Patient Ratio above and the CHD Risk Table to determine the patient's CHD Risk.        ATP III CLASSIFICATION (LDL):  <100     mg/dL   Optimal  696-295  mg/dL   Near or Above                    Optimal  130-159  mg/dL   Borderline  284-132  mg/dL   High  >440     mg/dL   Very High   TRIG 36 01/15/2535   BNP (last 3 results) Recent Labs    09/06/17 0902 09/14/17 1155  BNP 508.4* 1,006.9*    ProBNP (last 3 results) No results for input(s): PROBNP in the last 8760 hours.   Diagnostic Studies/Procedures   No results found.  Discharge Medications   Allergies as of 09/18/2017      Reactions   Amiodarone Itching, Rash      Medication List    STOP taking these medications   metoprolol tartrate 50 MG tablet Commonly known as:  LOPRESSOR   naproxen sodium 220 MG tablet Commonly known as:  ALEVE     TAKE these medications   acetaminophen 325 MG tablet Commonly known as:  TYLENOL Take 2 tablets (650 mg total) by mouth every 4 (four) hours as needed for headache or mild pain.   furosemide 80 MG tablet Commonly known as:  LASIX Take 80 mg (1 tablet) q am and 40 mg (0.5 tablet) q pm.  Take extra 40 mg as needed for weight gain. What changed:    how much to take  how to take this  when to take this  additional instructions   metoprolol succinate 100 MG 24 hr tablet Commonly known as:   TOPROL-XL Take 1 tablet (100 mg total) by mouth at bedtime. Take with or immediately following a meal.   potassium chloride SA 20 MEQ tablet Commonly known as:  K-DUR,KLOR-CON Take 1 tablet (20 mEq total) by mouth 2 (two) times daily.   rivaroxaban 20 MG Tabs tablet Commonly known as:  XARELTO Take 1 tablet (20 mg total) by mouth daily with supper.   sacubitril-valsartan 24-26 MG Commonly known as:  ENTRESTO Take 1 tablet by mouth 2 (two) times daily.      Disposition   The patient will be discharged in stable condition to home.  Discharge Instructions    (HEART FAILURE PATIENTS) Call MD:  Anytime you have any of the following symptoms: 1) 3 pound weight gain in 24 hours or 5 pounds in 1 week 2) shortness of breath, with or without a dry hacking cough 3) swelling in the hands, feet or stomach 4) if you have to sleep on extra pillows at night in order to breathe.   Complete by:  As directed    Diet - low sodium heart healthy   Complete by:  As directed    Increase activity slowly   Complete by:  As directed    STOP any activity that causes chest pain, shortness of breath, dizziness, sweating, or exessive weakness   Complete by:  As directed      Follow-up Information    Go to Renaye Rakers, MD.   Specialty:  Family Medicine Contact information: [address] [place] [address] [phone]         [facility] Follow up on 10/04/2017.   Specialty:  Cardiology Why:  at  1030 for post hospital follow up. Code for parking is 1400. Psychologist, sport and exercise thru [employer] off of [address], underground parking on your right. Can also park in lower ED lot and enter thru blue awning.  Contact information: [address] [id number] [place] [address] [phone]            Duration of Discharge Encounter: Greater than 35 minutes   Signed, Luane School 09/18/2017, 1:04 PM    Patient seen and  examined with the above-signed Advanced Practice Provider and/or Housestaff. I personally reviewed laboratory data, imaging studies and relevant notes. I independently examined the patient and formulated the important aspects of the plan. I have edited the note to reflect any of my changes or salient points. I have personally discussed the plan with the patient and/or family.  He looks great. Weight down almost 30 pounds. Creatinine improved. Tolerating Entresto. Will send home today on lasix 80/40. Watch volume status closely. Can increase to 80/80 as needed. Maintaining NSR.   Arvilla Meres, MD  2:46 PM

## 2017-09-18 NOTE — Progress Notes (Addendum)
Advanced Heart Failure Rounding Note   Subjective:    Coox 74.5%. CVP 5-6   Feeling good this am. Denies SOB. No lightheadedness or dizziness with standing.   Creatinine stable at 1.29. K 4.1.   Weight down another 7 lbs. Down 29 lbs total this admit.   Objective:   Weight Range:  Vital Signs:   Temp:  [98.1 F (36.7 C)-98.6 F (37 C)] 98.3 F (36.8 C) (07/01 0514) Pulse Rate:  [62-65] 62 (07/01 0514) Resp:  [17-24] 24 (07/01 0514) BP: (106-131)/(66-78) 131/78 (07/01 0514) SpO2:  [96 %-100 %] 97 % (07/01 0514) Weight:  [263 lb 3.2 oz (119.4 kg)] 263 lb 3.2 oz (119.4 kg) (07/01 0514) Last BM Date: 09/16/17  Weight change: Filed Weights   09/16/17 0537 09/17/17 0552 09/18/17 0514  Weight: 275 lb 9.6 oz (125 kg) 270 lb 11.2 oz (122.8 kg) 263 lb 3.2 oz (119.4 kg)    Intake/Output:   Intake/Output Summary (Last 24 hours) at 09/18/2017 1048 Last data filed at 09/18/2017 0516 Gross per 24 hour  Intake 720 ml  Output 4770 ml  Net -4050 ml      Physical Exam   General: Well appearing. No resp difficulty. HEENT: Normal Neck: Supple. JVP 5-6. Carotids 2+ bilat; no bruits. No thyromegaly or nodule noted. Cor: PMI nondisplaced. RRR, No M/G/R noted Lungs: CTAB, normal effort. Abdomen: Soft, non-tender, non-distended, no HSM. No bruits or masses. +BS  Extremities: No cyanosis, clubbing, or rash. R and LLE no edema.  Neuro: Alert & orientedx3, cranial nerves grossly intact. moves all 4 extremities w/o difficulty. Affect pleasant   Telemetry   NSR 70s, personally reviewed.   Lab    Basic Metabolic Panel: Recent Labs  Lab 09/14/17 1155 09/15/17 0300 09/15/17 1842 09/16/17 0500 09/17/17 0500 09/18/17 0500  NA 140 141  --  141 140 139  K 3.2* 3.6  --  3.8 3.9 4.1  CL 105 104  --  103 104 102  CO2 26 29  --  27 30 30   GLUCOSE 138* 111*  --  93 89 93  BUN 18 19  --  16 18 16   CREATININE 1.83* 1.66*  --  1.37* 1.38* 1.29*  CALCIUM 8.6* 8.7*  --  8.6* 8.8*  8.7*  MG  --   --  1.7  --   --   --     Liver Function Tests: Recent Labs  Lab 09/14/17 1155  AST 25  ALT 20  ALKPHOS 56  BILITOT 0.8  PROT 6.4*  ALBUMIN 3.1*   No results for input(s): LIPASE, AMYLASE in the last 168 hours. No results for input(s): AMMONIA in the last 168 hours.  CBC: Recent Labs  Lab 09/14/17 1534 09/15/17 0300 09/16/17 0500 09/17/17 0500 09/18/17 0500  WBC 5.2 4.8 5.0 5.1 4.8  HGB 11.5* 11.5* 12.0* 12.4* 13.4  HCT 36.2* 35.9* 37.9* 38.7* 41.9  MCV 95.3 95.0 95.9 94.9 95.0  PLT 114* 108* 111* 113* 128*    Cardiac Enzymes: No results for input(s): CKTOTAL, CKMB, CKMBINDEX, TROPONINI in the last 168 hours.  BNP: BNP (last 3 results) Recent Labs    09/06/17 0902 09/14/17 1155  BNP 508.4* 1,006.9*    ProBNP (last 3 results) No results for input(s): PROBNP in the last 8760 hours.    Other results:  Imaging: No results found.   Medications:     Scheduled Medications: . metoprolol succinate  100 mg Oral QHS  . potassium chloride  40 mEq Oral BID  . rivaroxaban  20 mg Oral Q supper  . sacubitril-valsartan  1 tablet Oral BID    Infusions:   PRN Medications: acetaminophen, nitroGLYCERIN, ondansetron (ZOFRAN) IV, sodium chloride flush   Assessment:    [name] is a 67 y.o. male with a history of persistent afib (s/p ablation 2012, DCCV 09/12/17), HTN, and chronic systolic HF.   Admitted for volume overload from Afib clinic.    Plan/Discussion:    1. A/C systolic HF. EF 25% as far back as 2009. LHC 2012: normal coronaries.  - Echo 09/12/17: EF 15-20%, diffuse HK, mild to mod MR - Volume status stable. Down 29 lbs total.  - Continue lasix 80 mg BID for home.  - Lopressor switched to Toprol XL with low EF - Entresto 24/26 added yesterday. Will continue this dose for now.  - Continue TED hose - Likely home today. Will discuss with MD.   2. Persistent Afib s/p ablation in 2012 and DCCV 6/25 - Remains in sinus -  Continue Xarelto for home.  - Off ASA with Xarelto   3. HTN - BP stable. Tolerating Entresto  4. AKI on CKD 3 - Resolved. Creatinine stable at 1.29.   5. Hypokalemia - Stable at 4.1.  Length of Stay: 4  Luane School  09/18/2017, 10:48 AM  Advanced Heart Failure Team Pager [phone] (M-F; 7a - 4p)  Please contact [facility] for night-coverage after hours (4p -7a ) and weekends on [email or web address]  Patient seen and examined with the above-signed Advanced Practice Provider and/or Housestaff. I personally reviewed laboratory data, imaging studies and relevant notes. I independently examined the patient and formulated the important aspects of the plan. I have edited the note to reflect any of my changes or salient points. I have personally discussed the plan with the patient and/or family.  He looks great. Weight down almost 30 pounds. Creatinine improved. Tolerating Entresto. Will send home today on lasix 80/40. Watch volume status closely. Can increase to 80/80 as needed. Maintaining NSR.   Arvilla Meres, MD  2:45 PM

## 2017-09-18 NOTE — Telephone Encounter (Signed)
Entresto PA approved through [facility], [employer].  Approved from 08/19/2017 through 09/18/2018.

## 2017-09-19 ENCOUNTER — Inpatient Hospital Stay (HOSPITAL_COMMUNITY): Admission: RE | Admit: 2017-09-19 | Payer: 59 | Source: Ambulatory Visit | Admitting: Nurse Practitioner

## 2017-09-19 ENCOUNTER — Encounter (HOSPITAL_COMMUNITY): Payer: 59 | Admitting: Internal Medicine

## 2017-10-03 NOTE — Progress Notes (Signed)
Advanced Heart Failure Clinic Note   PCP: Renaye Rakers, MD PCP-Cardiologist: No primary care provider on file.  HF: Dr Gala Romney EP: Elberta Fortis  HPI: [name] is a 67 y.o. male with a history of persistent afib (s/p ablation 2012, DCCV 09/12/17), HTN, and chronic systolic HF.   Admitted 6/27-09/18/17 from afib clinic with volume overload and AKI. HF team consulted. Echo showed EF 15-20%. Diuresed 29 lbs with IV lasix. Transitioned to lasix 80/40. HF medications optimized as able. AKI resolved with diuresis. DC weight: 263 lbs.   He returns today for post hospital follow up. Overall doing well. 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 Denies SOB, orthopnea, PND, or edema. No CP, palpitations, or dizziness. Wearing TED hose everyday. No bleeding on xarelto. Weights stable since discharge 262-264 lbs at home. Limiting fluid and salt intake. Taking all medications. Taking extra lasix 2-3x/week. Plans to start plant based diet.   Cardiac Studies: TEE6/25/19 EF 15-20%, diffuse HK, mild to mod MR, RV systolic function reduced, RA and LA dilated  LHC 06/12/2010 1. Normal coronaries, mild global LV dysfunction, most likely AFib mediated. 2. No mitral regurgitation.  Review of systems complete and found to be negative unless listed in HPI.   Past Medical History:  Diagnosis Date  . Atrial tachycardia (HCC)   . Cardiomyopathy (HCC)   . Hypertension   . Mitral regurgitation   . Paroxysmal atrial flutter (HCC)   . Persistent atrial fibrillation (HCC)     Current Outpatient Medications  Medication Sig Dispense Refill  . acetaminophen (TYLENOL) 325 MG tablet Take 2 tablets (650 mg total) by mouth every 4 (four) hours as needed for headache or mild pain.    . furosemide (LASIX) 80 MG tablet Take 80 mg (1 tablet) q am and 40 mg (0.5 tablet) q pm.  Take extra 40 mg as needed for weight gain. 60 tablet 3  . metoprolol succinate (TOPROL-XL) 100 MG 24 hr tablet Take 1 tablet (100 mg total)  by mouth at bedtime. Take with or immediately following a meal. 30 tablet 6  . potassium chloride SA (K-DUR,KLOR-CON) 20 MEQ tablet Take 1 tablet (20 mEq total) by mouth 2 (two) times daily. 60 tablet 6  . rivaroxaban (XARELTO) 20 MG TABS tablet Take 1 tablet (20 mg total) by mouth daily with supper. 30 tablet 6  . sacubitril-valsartan (ENTRESTO) 24-26 MG Take 1 tablet by mouth 2 (two) times daily. 60 tablet 3   No current facility-administered medications for this encounter.     Allergies  Allergen Reactions  . Amiodarone Itching and Rash      Social History   Socioeconomic History  . Marital status: Widowed    Spouse name: Not on file  . Number of children: Not on file  . Years of education: Not on file  . Highest education level: Not on file  Occupational History  . Not on file  Social Needs  . Financial resource strain: Not on file  . Food insecurity:    Worry: Not on file    Inability: Not on file  . Transportation needs:    Medical: Not on file    Non-medical: Not on file  Tobacco Use  . Smoking status: Never Smoker  . Smokeless tobacco: Never Used  Substance and Sexual Activity  . Alcohol use: Yes    Comment: rare  . Drug use: Never  . Sexual activity: Not on file  Lifestyle  . Physical activity:  Days per week: Not on file    Minutes per session: Not on file  . Stress: Not on file  Relationships  . Social connections:    Talks on phone: Not on file    Gets together: Not on file    Attends religious service: Not on file    Active member of club or organization: Not on file    Attends meetings of clubs or organizations: Not on file    Relationship status: Not on file  . Intimate partner violence:    Fear of current or ex partner: Not on file    Emotionally abused: Not on file    Physically abused: Not on file    Forced sexual activity: Not on file  Other Topics Concern  . Not on file  Social History Narrative  . Not on file     No family history  on file.  Vitals:   10/04/17 1029  BP: 128/86  Pulse: 68  SpO2: 96%  Weight: 269 lb 12.8 oz (122.4 kg)   Wt Readings from Last 3 Encounters:  10/04/17 269 lb 12.8 oz (122.4 kg)  09/18/17 263 lb 3.2 oz (119.4 kg)  09/14/17 294 lb 9.6 oz (133.6 kg)    PHYSICAL EXAM: General:  Well appearing. No respiratory difficulty. Walked into clinic.  HEENT: normal Neck: supple. no JVD. Carotids 2+ bilat; no bruits. No lymphadenopathy or thyromegaly appreciated. Cor: PMI nondisplaced. Regular rate & rhythm. No rubs, gallops or murmurs. Lungs: clear Abdomen: soft, nontender, nondistended. No hepatosplenomegaly. No bruits or masses. Good bowel sounds. Extremities: no cyanosis, clubbing, rash, edema Neuro: alert & oriented x 3, cranial nerves grossly intact. moves all 4 extremities w/o difficulty. Affect pleasant.   ASSESSMENT & PLAN:  1. Chronic systolic HF. EF 25% as far back as 2009. LHC 2012: normal coronaries.No ICD.  - Echo6/25/19:EF 15-20%, diffuse HK, mild to mod MR - NYHA class II - Volume status stable.  - Continue lasix 80 mg am, 40 mg pm with additional 40 mg lasix PRN.  - Continue Toprol XL 100 mg daily  - Continue Entresto 24/26 mg BID - Start spiro 12.5 mg daily today. BMET today and in 7-10 days. Cut potassium back to 20 meq daily.  - Continue TED hose - Repeat echo in 3-4 months now that he is back on medications and in NSR. We discussed possibility of ICD if EF remains low.  2. Persistent Afib s/p ablation in 2012 and DCCV 6/25 - Continue Xarelto and Toprol XL - Denies bleeding or palpitations. - Regular on exam.    3. HTN - Well controlled today.   4. CKD 3 - BMET today  Start spiro 12.5 mg daily BMET today and in 7-10 days Follow up 3 weeks pharmacy, 6 weeks with APP Repeat echo 3-4 months with Dr Gala Romney  Alford Highland, NP 10/04/17  Greater than 50% of the 25 minute visit was spent in counseling/coordination of care regarding disease state  education, salt/fluid restriction, sliding scale diuretics, and medication compliance.

## 2017-10-04 ENCOUNTER — Ambulatory Visit (HOSPITAL_COMMUNITY)
Admission: RE | Admit: 2017-10-04 | Discharge: 2017-10-04 | Disposition: A | Discharge status: Home / Self Care | Payer: 59 | Source: Ambulatory Visit | Attending: Cardiology | Admitting: Cardiology

## 2017-10-04 VITALS — BP 128/86 | HR 68 | Wt 269.8 lb

## 2017-10-04 DIAGNOSIS — Z09 Encounter for follow-up examination after completed treatment for conditions other than malignant neoplasm: Secondary | ICD-10-CM | POA: Insufficient documentation

## 2017-10-04 DIAGNOSIS — N183 Chronic kidney disease, stage 3 unspecified: Secondary | ICD-10-CM

## 2017-10-04 DIAGNOSIS — I5022 Chronic systolic (congestive) heart failure: Secondary | ICD-10-CM | POA: Diagnosis not present

## 2017-10-04 DIAGNOSIS — I10 Essential (primary) hypertension: Secondary | ICD-10-CM

## 2017-10-04 DIAGNOSIS — I4819 Other persistent atrial fibrillation: Secondary | ICD-10-CM

## 2017-10-04 DIAGNOSIS — I130 Hypertensive heart and chronic kidney disease with heart failure and stage 1 through stage 4 chronic kidney disease, or unspecified chronic kidney disease: Secondary | ICD-10-CM | POA: Diagnosis not present

## 2017-10-04 DIAGNOSIS — I481 Persistent atrial fibrillation: Secondary | ICD-10-CM

## 2017-10-04 DIAGNOSIS — Z888 Allergy status to other drugs, medicaments and biological substances status: Secondary | ICD-10-CM | POA: Insufficient documentation

## 2017-10-04 DIAGNOSIS — Z79899 Other long term (current) drug therapy: Secondary | ICD-10-CM | POA: Insufficient documentation

## 2017-10-04 LAB — BASIC METABOLIC PANEL
Anion gap: 7 (ref 5–15)
BUN: 15 mg/dL (ref 8–23)
CHLORIDE: 100 mmol/L (ref 98–111)
CO2: 32 mmol/L (ref 22–32)
CREATININE: 1.48 mg/dL — AB (ref 0.61–1.24)
Calcium: 9.5 mg/dL (ref 8.9–10.3)
GFR calc Af Amer: 55 mL/min — ABNORMAL LOW (ref >60)
GFR calc non Af Amer: 48 mL/min — ABNORMAL LOW (ref >60)
GLUCOSE: 94 mg/dL (ref 70–99)
Potassium: 4.3 mmol/L (ref 3.5–5.1)
Sodium: 139 mmol/L (ref 135–145)

## 2017-10-04 MED ORDER — POTASSIUM CHLORIDE CRYS ER 20 MEQ PO TBCR: 20.0000 meq | EXTENDED_RELEASE_TABLET | Freq: Every day | ORAL | 3 refills | Status: DC

## 2017-10-04 MED ORDER — SPIRONOLACTONE 25 MG PO TABS: 12.5000 mg | ORAL_TABLET | Freq: Every day | ORAL | 3 refills | Status: DC

## 2017-10-04 NOTE — Patient Instructions (Signed)
DECREASE Potassium to 20 meq tablet ONCE daily.  START Spironolactone 12.5 mg (1/2 tablet) once daily.  Routine lab work today. Will notify you of abnormal results, otherwise no news is good news!  Return in 1-2 weeks for repeat labs.  Follow up with pharmacy in 3 weeks.  Follow up with Duwaine Maxin NP-C in 6 weeks.  Follow up with Dr. Gala Romney and echocardiogram in 3-4 months.  Take all medication as prescribed the day of your appointment. Bring all medications with you to your appointment.  Do the following things EVERYDAY: 1) Weigh yourself in the morning before breakfast. Write it down and keep it in a log. 2) Take your medicines as prescribed 3) Eat low salt foods-Limit salt (sodium) to 2000 mg per day.  4) Stay as active as you can everyday 5) Limit all fluids for the day to less than 2 liters

## 2017-10-11 ENCOUNTER — Ambulatory Visit (HOSPITAL_COMMUNITY)
Admission: RE | Admit: 2017-10-11 | Discharge: 2017-10-11 | Disposition: A | Discharge status: Home / Self Care | Payer: 59 | Source: Ambulatory Visit | Attending: Internal Medicine | Admitting: Internal Medicine

## 2017-10-11 DIAGNOSIS — I5022 Chronic systolic (congestive) heart failure: Secondary | ICD-10-CM

## 2017-10-11 LAB — BASIC METABOLIC PANEL
ANION GAP: 8 (ref 5–15)
BUN: 17 mg/dL (ref 8–23)
CO2: 26 mmol/L (ref 22–32)
Calcium: 8.8 mg/dL — ABNORMAL LOW (ref 8.9–10.3)
Chloride: 104 mmol/L (ref 98–111)
Creatinine, Ser: 1.37 mg/dL — ABNORMAL HIGH (ref 0.61–1.24)
GFR, EST NON AFRICAN AMERICAN: 52 mL/min — AB (ref >60)
GLUCOSE: 97 mg/dL (ref 70–99)
POTASSIUM: 4.2 mmol/L (ref 3.5–5.1)
Sodium: 138 mmol/L (ref 135–145)

## 2017-11-01 ENCOUNTER — Ambulatory Visit (HOSPITAL_COMMUNITY)
Admission: RE | Admit: 2017-11-01 | Discharge: 2017-11-01 | Disposition: A | Discharge status: Home / Self Care | Payer: 59 | Source: Ambulatory Visit | Attending: Internal Medicine | Admitting: Internal Medicine

## 2017-11-01 DIAGNOSIS — I130 Hypertensive heart and chronic kidney disease with heart failure and stage 1 through stage 4 chronic kidney disease, or unspecified chronic kidney disease: Secondary | ICD-10-CM | POA: Insufficient documentation

## 2017-11-01 DIAGNOSIS — I481 Persistent atrial fibrillation: Secondary | ICD-10-CM | POA: Insufficient documentation

## 2017-11-01 DIAGNOSIS — I5022 Chronic systolic (congestive) heart failure: Secondary | ICD-10-CM | POA: Diagnosis present

## 2017-11-01 DIAGNOSIS — N183 Chronic kidney disease, stage 3 (moderate): Secondary | ICD-10-CM | POA: Insufficient documentation

## 2017-11-01 DIAGNOSIS — Z9889 Other specified postprocedural states: Secondary | ICD-10-CM | POA: Insufficient documentation

## 2017-11-01 MED ORDER — SACUBITRIL-VALSARTAN 49-51 MG PO TABS: 1.0000 | ORAL_TABLET | Freq: Two times a day (BID) | ORAL | 5 refills | Status: DC

## 2017-11-01 NOTE — Patient Instructions (Signed)
It was great to meet you today!  Please INCREASE your Entresto 49-51 mg TWICE DAILY.   Please keep your appointment with the PA/NP on 11/15/17.

## 2017-11-01 NOTE — Progress Notes (Signed)
HF MD: Gala Romney  HPI:  [name] is a 67 y.o. malewith a history of persistent afib (s/p ablation 2012, DCCV 09/12/17), HTN, and chronic systolic HF.  Admitted 6/27-09/18/17 from afib clinic with volume overload and AKI. HF team consulted. Echo showed EF 15-20%. Diuresed 29 lbs with IV lasix. Transitioned to lasix 80/40. HF medications optimized as able. AKI resolved with diuresis. DC weight: 263 lbs.   He returns today for pharmacist-led HF medication titration. At last HF clinic visit on 7/17, he was started on spironolactone 12.5 mg daily and KCl was decreased to 20 mEq daily. Overall doing well. 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 Walks a lot throughout the day without any difficulty. Wearing TED hose everyday. No bleeding on xarelto. Limiting fluid and salt intake. Taking all medications. Attempting to substitute unhealthy foods for healthier options (I.e plant based burger at [employer]).     . Shortness of breath/dyspnea on exertion? no  . Orthopnea/PND? no . Edema? no . Lightheadedness/dizziness? No - unless getting up too quickly from sitting/laying . Daily weights at home? Yes - stable within 260-265 lbs  . Blood pressure/heart rate monitoring at home? no . Following low-sodium/fluid-restricted diet? Yes - frozen veggies, [place] burgers  HF Medications: Furosemide 80 mg QAM and 40 mg QPM with additional 40 mg PRN weight gain/SOB -- additional every other week  Metoprolol succinate 100 mg PO QHS KCl 20 mEq PO daily Entresto 24-26 mg PO BID Spironolactone 12.5 mg PO daily   Has the patient been experiencing any side effects to the medications prescribed?  no  Does the patient have any problems obtaining medications due to transportation or finances?   No - Arts development officer - given [employer] and Xarelto $10 copay cards (each $75 copays)  Understanding of regimen: good Understanding of indications: good Potential of compliance: good Patient understands to  avoid NSAIDs. Patient understands to avoid decongestants.    Pertinent Lab Values: . 10/11/17: Serum creatinine 1.37 (BL ~1.3-1.5), BUN 17, Potassium 4.2, Sodium 138  Vital Signs: . Weight: 271.4 lb (dry weight: 269 lb) . Blood pressure: 118/78 mmHg (106-131) . Heart rate: 64 bpm (62-68)  Assessment: 1. Chronicsystolic CHF (EF 32-67%), due to NICM. NYHA class IIsymptoms.  - Volume status stable although weight up slightly today  - Increase Entresto to 49-51 mg BID  - Continue furosemide 80 mg QAM/40 mg QPM, metoprolol succinate 100 mg daily and spironolactone 12.5 mg daily  - Basic disease state pathophysiology, medication indication, mechanism and side effects reviewed at length with patient and he verbalized understanding  2. Persistent Afib s/p ablation in 2012 and DCCV 6/25 -ContinueXarelto and Toprol XL - Denies bleeding or palpitations  3. HTN - Well controlled today  4. CKD 3 -SCr stable at baseline 1.3-1.5  Plan: 1) Medication changes: Based on clinical presentation, vital signs and recent labs will increase Entresto to 49-51 mg BID 2) Labs: BMET next clinic visit 3) Follow-up: PA/NP on 11/15/17   Druscilla Petsch K. Bonnye Fava, PharmD, BCPS, CPP Clinical Pharmacist Phone: [phone] 11/01/2017 12:19 PM

## 2017-11-15 ENCOUNTER — Encounter (HOSPITAL_COMMUNITY): Payer: Self-pay

## 2017-11-15 ENCOUNTER — Ambulatory Visit (HOSPITAL_COMMUNITY)
Admission: RE | Admit: 2017-11-15 | Discharge: 2017-11-15 | Disposition: A | Discharge status: Home / Self Care | Payer: 59 | Source: Ambulatory Visit | Attending: Internal Medicine | Admitting: Internal Medicine

## 2017-11-15 VITALS — BP 136/64 | HR 66 | Wt 275.8 lb

## 2017-11-15 DIAGNOSIS — Z79899 Other long term (current) drug therapy: Secondary | ICD-10-CM | POA: Diagnosis not present

## 2017-11-15 DIAGNOSIS — I481 Persistent atrial fibrillation: Secondary | ICD-10-CM | POA: Insufficient documentation

## 2017-11-15 DIAGNOSIS — Z7901 Long term (current) use of anticoagulants: Secondary | ICD-10-CM | POA: Insufficient documentation

## 2017-11-15 DIAGNOSIS — I10 Essential (primary) hypertension: Secondary | ICD-10-CM

## 2017-11-15 DIAGNOSIS — Z9889 Other specified postprocedural states: Secondary | ICD-10-CM | POA: Insufficient documentation

## 2017-11-15 DIAGNOSIS — N183 Chronic kidney disease, stage 3 unspecified: Secondary | ICD-10-CM

## 2017-11-15 DIAGNOSIS — I130 Hypertensive heart and chronic kidney disease with heart failure and stage 1 through stage 4 chronic kidney disease, or unspecified chronic kidney disease: Secondary | ICD-10-CM | POA: Insufficient documentation

## 2017-11-15 DIAGNOSIS — I5022 Chronic systolic (congestive) heart failure: Secondary | ICD-10-CM

## 2017-11-15 DIAGNOSIS — I429 Cardiomyopathy, unspecified: Secondary | ICD-10-CM | POA: Insufficient documentation

## 2017-11-15 LAB — BASIC METABOLIC PANEL
Anion gap: 5 (ref 5–15)
BUN: 11 mg/dL (ref 8–23)
CHLORIDE: 105 mmol/L (ref 98–111)
CO2: 27 mmol/L (ref 22–32)
Calcium: 8.6 mg/dL — ABNORMAL LOW (ref 8.9–10.3)
Creatinine, Ser: 1.41 mg/dL — ABNORMAL HIGH (ref 0.61–1.24)
GFR calc non Af Amer: 50 mL/min — ABNORMAL LOW (ref >60)
GFR, EST AFRICAN AMERICAN: 58 mL/min — AB (ref >60)
Glucose, Bld: 103 mg/dL — ABNORMAL HIGH (ref 70–99)
POTASSIUM: 4.5 mmol/L (ref 3.5–5.1)
SODIUM: 137 mmol/L (ref 135–145)

## 2017-11-15 MED ORDER — SPIRONOLACTONE 25 MG PO TABS: 25.0000 mg | ORAL_TABLET | Freq: Every day | ORAL | 3 refills | Status: DC

## 2017-11-15 NOTE — Patient Instructions (Signed)
Labs today (will call for abnormal results, otherwise no news is good news)  INCREASE Spironolactone to 25 mg Once Daily  Labs in 10 days (bmet)  Echocardiogram and Follow up with Dr. Gala Romney in 2 months.

## 2017-11-15 NOTE — Progress Notes (Signed)
Advanced Heart Failure Clinic Note   PCP: Renaye Rakers, MD PCP-Cardiologist: No primary care provider on file.  HF: Dr Gala Romney EP: Elberta Fortis  HPI: [name] is a 67 y.o. male with a history of persistent afib (s/p ablation 2012, DCCV 09/12/17), HTN, and chronic systolic HF.   Admitted 6/27-09/18/17 from afib clinic with volume overload and AKI. HF team consulted. Echo showed EF 15-20%. Diuresed 29 lbs with IV lasix. Transitioned to lasix 80/40. HF medications optimized as able. AKI resolved with diuresis. DC weight: 263 lbs.   He returns today for regular follow up. At last visit spiro added. Seen by Pharm-D and Entresto increased. He is feeling great overall. He continues to work full time. He denies SOB, orthopnea or PNd. No CP, palpitations, or dizziness.Wearing Ted hose everyday.   hospital follow up. Overall doing well. 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 Denies SOB, orthopnea, PND, or edema. No CP, palpitations, or dizziness. Wearing TED hose everyday. No bleeding on xarelto. Weights stable since discharge 262-264 lbs at home. Limiting fluid and salt intake. Taking all medications. Taking extra lasix 2-3x/week. Plans to start plant based diet.  He has been taking lasix 40 mg daily with an additional 40 mg as needed in the evening (3-4 times a week)  Cardiac Studies: TEE6/25/19 EF 15-20%, diffuse HK, mild to mod MR, RV systolic function reduced, RA and LA dilated  LHC 06/12/2010 1. Normal coronaries, mild global LV dysfunction, most likely AFib mediated. 2. No mitral regurgitation.  Review of systems complete and found to be negative unless listed in HPI.    Past Medical History:  Diagnosis Date  . Atrial tachycardia (HCC)   . Cardiomyopathy (HCC)   . Hypertension   . Mitral regurgitation   . Paroxysmal atrial flutter (HCC)   . Persistent atrial fibrillation (HCC)     Current Outpatient Medications  Medication Sig Dispense Refill  . acetaminophen (TYLENOL)  325 MG tablet Take 2 tablets (650 mg total) by mouth every 4 (four) hours as needed for headache or mild pain.    . furosemide (LASIX) 40 MG tablet Take 40 mg by mouth daily. Can take extra 40 mg as needed.    . metoprolol succinate (TOPROL-XL) 100 MG 24 hr tablet Take 1 tablet (100 mg total) by mouth at bedtime. Take with or immediately following a meal. 30 tablet 6  . potassium chloride SA (K-DUR,KLOR-CON) 20 MEQ tablet Take 1 tablet (20 mEq total) by mouth daily. 90 tablet 3  . rivaroxaban (XARELTO) 20 MG TABS tablet Take 1 tablet (20 mg total) by mouth daily with supper. 30 tablet 6  . sacubitril-valsartan (ENTRESTO) 49-51 MG Take 1 tablet by mouth 2 (two) times daily. 60 tablet 5  . spironolactone (ALDACTONE) 25 MG tablet Take 1 tablet (25 mg total) by mouth daily. 90 tablet 3   No current facility-administered medications for this encounter.     Allergies  Allergen Reactions  . Amiodarone Itching and Rash      Social History   Socioeconomic History  . Marital status: Widowed    Spouse name: Not on file  . Number of children: Not on file  . Years of education: Not on file  . Highest education level: Not on file  Occupational History  . Not on file  Social Needs  . Financial resource strain: Not on file  . Food insecurity:    Worry: Not on file    Inability: Not on file  .  Transportation needs:    Medical: Not on file    Non-medical: Not on file  Tobacco Use  . Smoking status: Never Smoker  . Smokeless tobacco: Never Used  Substance and Sexual Activity  . Alcohol use: Yes    Comment: rare  . Drug use: Never  . Sexual activity: Not on file  Lifestyle  . Physical activity:    Days per week: Not on file    Minutes per session: Not on file  . Stress: Not on file  Relationships  . Social connections:    Talks on phone: Not on file    Gets together: Not on file    Attends religious service: Not on file    Active member of club or organization: Not on file     Attends meetings of clubs or organizations: Not on file    Relationship status: Not on file  . Intimate partner violence:    Fear of current or ex partner: Not on file    Emotionally abused: Not on file    Physically abused: Not on file    Forced sexual activity: Not on file  Other Topics Concern  . Not on file  Social History Narrative  . Not on file     No family history on file.  Vitals:   11/15/17 1512  BP: 136/64  Pulse: 66  SpO2: 95%  Weight: 125.1 kg (275 lb 12.8 oz)    Wt Readings from Last 3 Encounters:  11/15/17 125.1 kg (275 lb 12.8 oz)  11/01/17 123.1 kg (271 lb 6.4 oz)  10/04/17 122.4 kg (269 lb 12.8 oz)   PHYSICAL EXAM: General: Well appearing. No resp difficulty. HEENT: Normal Neck: Supple. JVP 6-7 cm. Carotids 2+ bilat; no bruits. No thyromegaly or nodule noted. Cor: PMI nondisplaced. RRR, No M/G/R noted Lungs: CTAB, normal effort. Abdomen: Soft, non-tender, non-distended, no HSM. No bruits or masses. +BS  Extremities: No cyanosis, clubbing, or rash. R and LLE no edema.  Neuro: Alert & orientedx3, cranial nerves grossly intact. moves all 4 extremities w/o difficulty. Affect pleasant   ASSESSMENT & PLAN:  1. Chronic systolic HF. EF 25% as far back as 2009. LHC 2012: normal coronaries.No ICD.  - Echo6/25/19:EF 15-20%, diffuse HK, mild to mod MR - NYHA II symptoms - Volume status stable on exam.   - Continue lasix 40 mg am and additional as needed. (he is doing several times a week)  - Continue Toprol XL 100 mg daily  - Continue Entresto 49/51 mg BID - Increase spiro to 25 mg daily. BMET today and 10 days.  - Continue TED hose - Repeat echo in 2-3 months now that he is back on medications and in NSR. We discussed possibility of ICD if EF remains low.  2. Persistent Afib s/p ablation in 2012 and DCCV 6/25 - Continue Xarelto and Toprol XL - Denies bleeding or palpitations. - Regular on exam.     3. HTN - Meds as above.   4. CKD 3 - BMET  today.   RTC 2-3 months with Dr. Gala Romney with Echo. Discussed possibility of ICD if EF remains low.   Graciella Freer, PA-C 11/15/17  Greater than 50% of the 25 minute visit was spent in counseling/coordination of care regarding disease state education, salt/fluid restriction, sliding scale diuretics, and medication compliance.

## 2017-11-16 ENCOUNTER — Telehealth (HOSPITAL_COMMUNITY): Payer: Self-pay | Admitting: Cardiology

## 2017-11-16 NOTE — Telephone Encounter (Signed)
Notes recorded by Theresia Bough, CMA on 11/16/2017 at 10:31 AM EDT Patient aware.  [phone] (M) ------  Notes recorded by Graciella Freer, PA-C on 11/16/2017 at 8:37 AM EDT With increase of spiro, he can stop daily K supp (20 meq)   Baldwin Crown" Soledad, PA-C 11/16/2017 8:37 AM

## 2017-11-27 ENCOUNTER — Ambulatory Visit (HOSPITAL_COMMUNITY)
Admission: RE | Admit: 2017-11-27 | Discharge: 2017-11-27 | Disposition: A | Discharge status: Home / Self Care | Payer: 59 | Source: Ambulatory Visit | Attending: Cardiology | Admitting: Cardiology

## 2017-11-27 DIAGNOSIS — I5022 Chronic systolic (congestive) heart failure: Secondary | ICD-10-CM | POA: Diagnosis not present

## 2017-11-27 LAB — BASIC METABOLIC PANEL
Anion gap: 8 (ref 5–15)
BUN: 9 mg/dL (ref 8–23)
CALCIUM: 8.6 mg/dL — AB (ref 8.9–10.3)
CHLORIDE: 105 mmol/L (ref 98–111)
CO2: 26 mmol/L (ref 22–32)
CREATININE: 1.3 mg/dL — AB (ref 0.61–1.24)
GFR calc non Af Amer: 56 mL/min — ABNORMAL LOW (ref >60)
Glucose, Bld: 105 mg/dL — ABNORMAL HIGH (ref 70–99)
Potassium: 4 mmol/L (ref 3.5–5.1)
SODIUM: 139 mmol/L (ref 135–145)

## 2018-01-02 ENCOUNTER — Telehealth (HOSPITAL_COMMUNITY): Payer: Self-pay | Admitting: Pharmacist

## 2018-01-02 MED ORDER — SILDENAFIL CITRATE 25 MG PO TABS: 25.0000 mg | ORAL_TABLET | Freq: Every day | ORAL | 0 refills | Status: DC | PRN

## 2018-01-02 NOTE — Telephone Encounter (Signed)
Mr. Sciarretta emailed asking if it would be safe to start on ED medication. Per discussion with Dr. Gala Romney, will send in Rx for generic Viagra to [facility] in [facility].   Tyler Deis. Bonnye Fava, PharmD, BCPS, CPP Clinical Pharmacist Phone: [phone] 01/02/2018 11:00 AM

## 2018-01-15 ENCOUNTER — Encounter (HOSPITAL_COMMUNITY): Payer: Self-pay | Admitting: Internal Medicine

## 2018-01-15 ENCOUNTER — Ambulatory Visit (HOSPITAL_COMMUNITY)
Admission: RE | Admit: 2018-01-15 | Discharge: 2018-01-15 | Disposition: A | Discharge status: Home / Self Care | Payer: 59 | Source: Ambulatory Visit | Attending: Family Medicine | Admitting: Family Medicine

## 2018-01-15 ENCOUNTER — Ambulatory Visit (HOSPITAL_BASED_OUTPATIENT_CLINIC_OR_DEPARTMENT_OTHER)
Admission: RE | Admit: 2018-01-15 | Discharge: 2018-01-15 | Disposition: A | Discharge status: Home / Self Care | Payer: 59 | Source: Ambulatory Visit | Attending: Internal Medicine | Admitting: Internal Medicine

## 2018-01-15 ENCOUNTER — Ambulatory Visit (HOSPITAL_COMMUNITY)
Admission: RE | Admit: 2018-01-15 | Discharge: 2018-01-15 | Disposition: A | Discharge status: Home / Self Care | Payer: 59 | Source: Ambulatory Visit | Attending: Internal Medicine | Admitting: Internal Medicine

## 2018-01-15 VITALS — BP 124/66 | HR 87 | Wt 276.4 lb

## 2018-01-15 DIAGNOSIS — I5022 Chronic systolic (congestive) heart failure: Secondary | ICD-10-CM | POA: Diagnosis not present

## 2018-01-15 DIAGNOSIS — I340 Nonrheumatic mitral (valve) insufficiency: Secondary | ICD-10-CM | POA: Diagnosis not present

## 2018-01-15 DIAGNOSIS — I4891 Unspecified atrial fibrillation: Secondary | ICD-10-CM

## 2018-01-15 DIAGNOSIS — R0683 Snoring: Secondary | ICD-10-CM

## 2018-01-15 MED ORDER — SACUBITRIL-VALSARTAN 97-103 MG PO TABS: 1.0000 | ORAL_TABLET | Freq: Two times a day (BID) | ORAL | 6 refills | Status: DC

## 2018-01-15 NOTE — Progress Notes (Signed)
  Echocardiogram 2D Echocardiogram has been performed.  [name] L Androw 01/15/2018, 2:43 PM

## 2018-01-15 NOTE — Progress Notes (Signed)
Zio patch placed onto patient.  All instructions and information reviewed with patient, they verbalize understanding with no questions. 

## 2018-01-15 NOTE — Progress Notes (Signed)
Advanced Heart Failure Clinic Note   PCP: Renaye Rakers, MD PCP-Cardiologist: No primary care provider on file.  HF: Dr Gala Romney EP: Elberta Fortis  HPI: [name] is a 67 y.o. male with a history of persistent afib (s/p ablation 2012, DCCV 09/12/17), HTN, and chronic systolic HF.   Underwent DC-CV of AF on 09/12/17.   Admitted 6/27-09/18/17 from afib clinic with volume overload and AKI. HF team consulted. Echo showed EF 15-20%. Diuresed 29 lbs with IV lasix. Transitioned to lasix 80/40. HF medications optimized as able. AKI resolved with diuresis. DC weight: 263 lbs.   He returns today for HF follow up. Last visit spiro was increased. Echo today with EF 45-50%. Overall doing well. Denies SOB other than with inclines - much improved. No orthopnea, PND, or edema. No CP. 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 Has occasional palpitations. Trying to lose weight and wants to try smoothie meal replacement. Taking all medications. Weights 265-269 lbs. Limits fluid and salt intake. Not eating out much anymore.  Cardiac Studies: TEE6/25/19: EF 15-20%, diffuse HK, mild to mod MR, RV systolic function reduced, RA and LA dilated Echo today EF 45-50%, diffuse HK, mild MR, LA severely dilated, RA mildly dilated, RV normal  LHC 06/12/2010 1. Normal coronaries, mild global LV dysfunction, most likely AFib mediated. 2. No mitral regurgitation.  Review of systems complete and found to be negative unless listed in HPI.   Past Medical History:  Diagnosis Date  . Atrial tachycardia (HCC)   . Cardiomyopathy (HCC)   . Hypertension   . Mitral regurgitation   . Paroxysmal atrial flutter (HCC)   . Persistent atrial fibrillation     Current Outpatient Medications  Medication Sig Dispense Refill  . acetaminophen (TYLENOL) 325 MG tablet Take 2 tablets (650 mg total) by mouth every 4 (four) hours as needed for headache or mild pain.    . furosemide (LASIX) 40 MG  tablet Take 40 mg by mouth daily. Can take extra 40 mg as needed.    . metoprolol succinate (TOPROL-XL) 100 MG 24 hr tablet Take 1 tablet (100 mg total) by mouth at bedtime. Take with or immediately following a meal. 30 tablet 6  . rivaroxaban (XARELTO) 20 MG TABS tablet Take 1 tablet (20 mg total) by mouth daily with supper. 30 tablet 6  . sacubitril-valsartan (ENTRESTO) 49-51 MG Take 1 tablet by mouth 2 (two) times daily. 60 tablet 5  . sildenafil (VIAGRA) 25 MG tablet Take 1 tablet (25 mg total) by mouth daily as needed for erectile dysfunction. 10 tablet 0  . spironolactone (ALDACTONE) 25 MG tablet Take 1 tablet (25 mg total) by mouth daily. 90 tablet 3   No current facility-administered medications for this encounter.     Allergies  Allergen Reactions  . Amiodarone Itching and Rash      Social History   Socioeconomic History  . Marital status: Widowed    Spouse name: Not on file  . Number of children: Not on file  . Years of education: Not on file  . Highest education level: Not on file  Occupational History  . Not on file  Social Needs  . Financial resource strain: Not on file  . Food insecurity:    Worry: Not on file    Inability: Not on file  . Transportation needs:    Medical: Not on file    Non-medical: Not on file  Tobacco Use  . Smoking status: Never Smoker  .  Smokeless tobacco: Never Used  Substance and Sexual Activity  . Alcohol use: Yes    Comment: rare  . Drug use: Never  . Sexual activity: Not on file  Lifestyle  . Physical activity:    Days per week: Not on file    Minutes per session: Not on file  . Stress: Not on file  Relationships  . Social connections:    Talks on phone: Not on file    Gets together: Not on file    Attends religious service: Not on file    Active member of club or organization: Not on file    Attends meetings of clubs or organizations: Not on file    Relationship status: Not on file  . Intimate partner violence:    Fear  of current or ex partner: Not on file    Emotionally abused: Not on file    Physically abused: Not on file    Forced sexual activity: Not on file  Other Topics Concern  . Not on file  Social History Narrative  . Not on file    Vitals:   01/15/18 1510  BP: 124/66  Pulse: 87  SpO2: 98%  Weight: 125.4 kg (276 lb 6.4 oz)    Wt Readings from Last 3 Encounters:  01/15/18 125.4 kg (276 lb 6.4 oz)  11/15/17 125.1 kg (275 lb 12.8 oz)  11/01/17 123.1 kg (271 lb 6.4 oz)   PHYSICAL EXAM: General: Well appearing. No resp difficulty.  HEENT: Normal Neck: Supple. JVP flat. Carotids 2+ bilat; no bruits. No thyromegaly or nodule noted. Cor: PMI nondisplaced. IRR, No M/G/R noted Lungs: CTAB, normal effort. Abdomen: Soft, non-tender, non-distended, no HSM. No bruits or masses. +BS  Extremities: No cyanosis, clubbing, or rash. R and LLE 1+ edema  Neuro: Alert & orientedx3, cranial nerves grossly intact. moves all 4 extremities w/o difficulty. Affect pleasant  EKG: Afib RVR alternating with NSR. Personally reviewed.   ASSESSMENT & PLAN:  1. Chronic systolic HF. EF 25% as far back as 2009. LHC 2012: normal coronaries.No ICD.  - Echo6/25/19:EF 15-20%, diffuse HK, mild to mod MR - Echo today EF 45-50%, diffuse HK, mild MR, LA severely dilated, RA mildly dilated, RV normal - NYHA II symptoms - Volume status stable on exam. - Continue lasix 40 mg daily. Take extra 40 mg PRN. - Continue Toprol XL 100 mg daily  - Increase Entresto 97/103 mg BID. Warned him to go back to 49/51 if dizziness gets worse. - Continue spiro 25 mg daily. - Continue TED hose - PYP scan  2. Persistent Afib s/p ablation in 2012 and DCCV 6/25 - Continue Xarelto and Toprol XL 100 mg daily - Denies bleeding. - Sounds irregular today. EKG shows afib RVR alternating with NSR - Check 14 day Ziopatch to see how much a fib he is having. May need referral to afib clinic.   3. HTN - Meds as above. Well controlled  4.  CKD 3 - Creatinine stable after spiro increase at 1.30 on 11/27/17  5. Snoring - Refer for sleep study  14 day Ziopatch to quantify how much afib he is having Refer for sleep study PYP scan Increase Entresto 97/103 mg BID Follow up in 4 weeks  Alford Highland, NP 01/15/18   Patient seen and examined with the above-signed Advanced Practice Provider and/or Housestaff. I personally reviewed laboratory data, imaging studies and relevant notes. I independently examined the patient and formulated the important aspects of the plan. I have edited  the note to reflect any of my changes or salient points. I have personally discussed the plan with the patient and/or family.  Overall much improved. Echo today with recovered EF 45-50%. But back in AF and I suspect AF was responsible for his decrease in EF recently. Will need to get him back in NSR. Will discuss with Dr. Elberta Fortis regarding whether or not he wants to proceed with repeat ablation now that EF improved or if we should start amiodarone. Agree with increasing Entresto, Also check PYP scan and sleep study.   Arvilla Meres, MD  9:54 PM

## 2018-01-15 NOTE — Patient Instructions (Addendum)
Increase Entresto to 97/103 mg Twice daily   Your physician has recommended that you have a sleep study. This test records several body functions during sleep, including: brain activity, eye movement, oxygen and carbon dioxide blood levels, heart rate and rhythm, breathing rate and rhythm, the flow of air through your mouth and nose, snoring, body muscle movements, and chest and belly movement.  You have been ordered a PYP Scan.  This is done in the Radiology Department of [facility].  When you come for this test please plan to be there 2-3 hours.  Your provider has recommended that  you wear a Zio Patch for 14 days.  This monitor will record your heart rhythm for our review.  IF you have any symptoms while wearing the monitor please press the button.  If you have any issues with the patch or you notice a red or orange light on it please call the company at [phone].  Once you remove the patch please mail it back to the company as soon as possible so we can get the results.  Your physician recommends that you schedule a follow-up appointment in: 1 month

## 2018-02-11 ENCOUNTER — Other Ambulatory Visit (HOSPITAL_COMMUNITY): Payer: Self-pay | Admitting: Student

## 2018-02-12 ENCOUNTER — Other Ambulatory Visit (HOSPITAL_COMMUNITY): Payer: Self-pay

## 2018-02-12 MED ORDER — FUROSEMIDE 40 MG PO TABS: 40.0000 mg | ORAL_TABLET | Freq: Every day | ORAL | 3 refills | Status: DC

## 2018-02-21 ENCOUNTER — Ambulatory Visit (HOSPITAL_COMMUNITY)
Admission: RE | Admit: 2018-02-21 | Discharge: 2018-02-21 | Disposition: A | Discharge status: Home / Self Care | Payer: 59 | Source: Ambulatory Visit | Attending: Internal Medicine | Admitting: Internal Medicine

## 2018-02-21 VITALS — BP 130/70 | HR 66 | Wt 290.8 lb

## 2018-02-21 DIAGNOSIS — Z79899 Other long term (current) drug therapy: Secondary | ICD-10-CM | POA: Diagnosis not present

## 2018-02-21 DIAGNOSIS — I5023 Acute on chronic systolic (congestive) heart failure: Secondary | ICD-10-CM | POA: Diagnosis present

## 2018-02-21 DIAGNOSIS — R0683 Snoring: Secondary | ICD-10-CM | POA: Insufficient documentation

## 2018-02-21 DIAGNOSIS — N183 Chronic kidney disease, stage 3 unspecified: Secondary | ICD-10-CM

## 2018-02-21 DIAGNOSIS — Z7901 Long term (current) use of anticoagulants: Secondary | ICD-10-CM | POA: Insufficient documentation

## 2018-02-21 DIAGNOSIS — I480 Paroxysmal atrial fibrillation: Secondary | ICD-10-CM | POA: Diagnosis not present

## 2018-02-21 DIAGNOSIS — I429 Cardiomyopathy, unspecified: Secondary | ICD-10-CM | POA: Diagnosis not present

## 2018-02-21 DIAGNOSIS — I130 Hypertensive heart and chronic kidney disease with heart failure and stage 1 through stage 4 chronic kidney disease, or unspecified chronic kidney disease: Secondary | ICD-10-CM | POA: Diagnosis not present

## 2018-02-21 DIAGNOSIS — I493 Ventricular premature depolarization: Secondary | ICD-10-CM | POA: Diagnosis not present

## 2018-02-21 DIAGNOSIS — I5022 Chronic systolic (congestive) heart failure: Secondary | ICD-10-CM

## 2018-02-21 LAB — BASIC METABOLIC PANEL
Anion gap: 10 (ref 5–15)
BUN: 14 mg/dL (ref 8–23)
CALCIUM: 8.8 mg/dL — AB (ref 8.9–10.3)
CHLORIDE: 103 mmol/L (ref 98–111)
CO2: 25 mmol/L (ref 22–32)
CREATININE: 1.28 mg/dL — AB (ref 0.61–1.24)
GFR, EST NON AFRICAN AMERICAN: 58 mL/min — AB (ref >60)
Glucose, Bld: 89 mg/dL (ref 70–99)
Potassium: 3.9 mmol/L (ref 3.5–5.1)
SODIUM: 138 mmol/L (ref 135–145)

## 2018-02-21 MED ORDER — FUROSEMIDE 80 MG PO TABS: 80.0000 mg | ORAL_TABLET | Freq: Every day | ORAL | 3 refills | Status: DC

## 2018-02-21 NOTE — Progress Notes (Signed)
Advanced Heart Failure Clinic Note   PCP: Renaye Rakers, MD PCP-Cardiologist: No primary care provider on file.  HF: Dr Gala Romney EP: Elberta Fortis  HPI: [name] is a 67 y.o. male with a history of persistent afib (s/p ablation 2012, DCCV 09/12/17), HTN, and chronic systolic HF.   Underwent DC-CV of AF on 09/12/17.   Admitted 6/27-09/18/17 from afib clinic with volume overload and AKI. HF team consulted. Echo showed EF 15-20%. Diuresed 29 lbs with IV lasix. Transitioned to lasix 80/40. HF medications optimized as able. AKI resolved with diuresis. DC weight: 263 lbs.   Here for f/u. Feeling pretty good. Denies SOB, orthopnea or PND. No CP. At last visit we did ECG and caught him going in and out of AF. We did 14 day monitor only 2% AF with rare PVCs. Denies palpitations. Weight up 15 pounds.   Cardiac Studies: TEE6/25/19: EF 15-20%, diffuse HK, mild to mod MR, RV systolic function reduced, RA and LA dilated Echo 10/19 EF 45-50%, diffuse HK, mild MR, LA severely dilated, RA mildly dilated, RV normal  Heart monitor 14 days (12/19)  1. Predominant underlying rhythm was Sinus Rhythm. Average HR 74 bpm 2. One run of NSVT lasting 4 beats 3. Atrial Fibrillation occurred (2% burden), ranging from 68-195 bpm (avg of 116 bpm), the longest lasting 4 hours 34 mins with an avg rate of 115 bpm.  4. Rare PVCs  LHC 06/12/2010 1. Normal coronaries, mild global LV dysfunction, most likely AFib mediated. 2. No mitral regurgitation.  Review of systems complete and found to be negative unless listed in HPI.   Past Medical History:  Diagnosis Date  . Atrial tachycardia (HCC)   . Cardiomyopathy (HCC)   . Hypertension   . Mitral regurgitation   . Paroxysmal atrial flutter (HCC)   . Persistent atrial fibrillation     Current Outpatient Medications  Medication Sig Dispense Refill  . acetaminophen (TYLENOL) 325 MG tablet Take 2 tablets (650 mg total) by mouth every 4 (four) hours as needed for  headache or mild pain.    . furosemide (LASIX) 40 MG tablet Take 1 tablet (40 mg total) by mouth daily. Can take extra 40 mg as needed. 30 tablet 3  . metoprolol succinate (TOPROL-XL) 100 MG 24 hr tablet Take 1 tablet (100 mg total) by mouth at bedtime. Take with or immediately following a meal. 30 tablet 6  . rivaroxaban (XARELTO) 20 MG TABS tablet Take 1 tablet (20 mg total) by mouth daily with supper. 30 tablet 6  . sacubitril-valsartan (ENTRESTO) 97-103 MG Take 1 tablet by mouth 2 (two) times daily. 60 tablet 6  . sildenafil (VIAGRA) 25 MG tablet Take 1 tablet (25 mg total) by mouth daily as needed for erectile dysfunction. 10 tablet 0  . spironolactone (ALDACTONE) 25 MG tablet Take 1 tablet (25 mg total) by mouth daily. 90 tablet 3   No current facility-administered medications for this encounter.     Allergies  Allergen Reactions  . Amiodarone Itching and Rash      Social History   Socioeconomic History  . Marital status: Widowed    Spouse name: Not on file  . Number of children: Not on file  . Years of education: Not on file  . Highest education level: Not on file  Occupational History  . Not on file  Social Needs  . Financial resource strain: Not on file  . Food insecurity:    Worry: Not on file    Inability: Not  on file  . Transportation needs:    Medical: Not on file    Non-medical: Not on file  Tobacco Use  . Smoking status: Never Smoker  . Smokeless tobacco: Never Used  Substance and Sexual Activity  . Alcohol use: Yes    Comment: rare  . Drug use: Never  . Sexual activity: Not on file  Lifestyle  . Physical activity:    Days per week: Not on file    Minutes per session: Not on file  . Stress: Not on file  Relationships  . Social connections:    Talks on phone: Not on file    Gets together: Not on file    Attends religious service: Not on file    Active member of club or organization: Not on file    Attends meetings of clubs or organizations: Not on  file    Relationship status: Not on file  . Intimate partner violence:    Fear of current or ex partner: Not on file    Emotionally abused: Not on file    Physically abused: Not on file    Forced sexual activity: Not on file  Other Topics Concern  . Not on file  Social History Narrative  . Not on file    Vitals:   02/21/18 1524  BP: 130/70  Pulse: 66  SpO2: 98%  Weight: 131.9 kg (290 lb 12.8 oz)    Wt Readings from Last 3 Encounters:  02/21/18 131.9 kg (290 lb 12.8 oz)  01/15/18 125.4 kg (276 lb 6.4 oz)  11/15/17 125.1 kg (275 lb 12.8 oz)   PHYSICAL EXAM: General:  Well appearing. No resp difficulty HEENT: normal Neck: supple. JVP 8-9. Carotids 2+ bilat; no bruits. No lymphadenopathy or thryomegaly appreciated. Cor: PMI nondisplaced. Regular rate & rhythm. No rubs, gallops or murmurs. Lungs: clear Abdomen: obese soft, nontender, nondistended. No hepatosplenomegaly. No bruits or masses. Good bowel sounds. Extremities: no cyanosis, clubbing, rash, 2+ edema Neuro: alert & orientedx3, cranial nerves grossly intact. moves all 4 extremities w/o difficulty. Affect pleasant   ASSESSMENT & PLAN:  1. Chronic systolic HF. EF 25% as far back as 2009. LHC 2012: normal coronaries.No ICD.  - Echo6/25/19:EF 15-20%, diffuse HK, mild to mod MR - Echo 10/19  EF 45-50%, diffuse HK, mild MR, LA severely dilated, RA mildly dilated, RV normal - NYHA II symptoms - Volume status up on exam. Weight up 15 pounds. REDS = 41%  - Increase lasix from 40 mg daily to 80 daily. Take extra 40 mg PRN. Check labs today and in 2 week. Watch fluid and salt intake.  - Continue Toprol XL 100 mg daily  - Continue Entresto 97/103 mg BID.  - Continue spiro 25 mg daily. - Continue TED hose  2. Paroxysmal Afib s/p ablation in 2012 and DCCV 09/12/17 - At last visit we did ECG and caught him going in and out of AF. We did 14 day monitor only 2% AF with rare PVCs.   - Continue Xarelto and Toprol XL 100 mg  daily - Denies bleeding. - Follows with Dr. Elberta Fortis  3. HTN - BP well controlled. Meds as above.   4. CKD 3 - Creatinine stable after spiro increase at 1.30 on 11/27/17. Repeat today.   5. Snoring - Refer for sleep study  Arvilla Meres, MD 02/21/18

## 2018-02-21 NOTE — Patient Instructions (Signed)
Labs done today  Labs will need to be done in 2 weeks  INCREASE Furosemide 80mg  (1 tab) daily.  Follow up with Dr. Gala Romney in 3 months

## 2018-02-22 NOTE — Progress Notes (Signed)
ReDS Vest - 02/21/18 1350      ReDS Vest   MR   Moderate    Estimated volume prior to reading  Med    Fitting Posture  Sitting    Height Marker  Tall    Ruler Value  34    Center Strip  Aligned    ReDS Value  41

## 2018-02-22 NOTE — Addendum Note (Signed)
Encounter addended by: Nicole Cella, RN on: 02/22/2018 12:18 PM  Actions taken: Sign clinical note, Flowsheet accepted

## 2018-03-06 ENCOUNTER — Other Ambulatory Visit (HOSPITAL_COMMUNITY): Payer: 59

## 2018-03-09 ENCOUNTER — Encounter (HOSPITAL_COMMUNITY): Payer: 59

## 2018-03-27 NOTE — Progress Notes (Signed)
Advanced Heart Failure Clinic Note   PCP: Renaye Rakers, MD PCP-Cardiologist: No primary care provider on file.  HF: Dr Gala Romney EP: Elberta Fortis  HPI: [name] is a 68 y.o. male with a history of persistent afib (s/p ablation 2012, DCCV 09/12/17), HTN, CKD 3, and chronic systolic HF.   Underwent DC-CV of AF on 09/12/17.   Admitted 6/27-09/18/17 from afib clinic with volume overload and AKI. HF team consulted. Echo showed EF 15-20%. Diuresed 29 lbs with IV lasix. Transitioned to lasix 80/40. HF medications optimized as able. AKI resolved with diuresis. DC weight: 263 lbs.   He returns for HF follow up today. Last visit lasix was doubled due to 15 lb weight gain and REDS clip 41%. Overall feeling great. Down 18 lbs with increased lasix dose. Weight came off slowly and has now leveled out. He is 262-266 lbs on his home scales. Denies SOB. Able to walk fast with no problems. Does not do many inclines or stairs. Activity limited by knee pain. Mild edema comes and goes. Denies orthopnea or PND. Has not heard anything yet about sleep study. No palpitations or bleeding. He is only dizzy when he bends over and stands up quickly, but none if he moves slowly. This is chronic and unchanged. Taking all medications. Has not needed extra lasix recently. He tries to limit fluid and salt, but does eat out occasionally. Daughter lives with him and does not always cook low salt meals.   Cardiac Studies: TEE6/25/19: EF 15-20%, diffuse HK, mild to mod MR, RV systolic function reduced, RA and LA dilated Echo 10/19 EF 45-50%, diffuse HK, mild MR, LA severely dilated, RA mildly dilated, RV normal  Heart monitor 14 days (12/19) 1. Predominant underlying rhythm was Sinus Rhythm. Average HR 74 bpm 2. One run of NSVT lasting 4 beats 3. Atrial Fibrillation occurred (2% burden), ranging from 68-195 bpm (avg of 116 bpm), the longest lasting 4 hours 34 mins with an avg rate of 115 bpm.  4. Rare PVCs  LHC 06/12/2010 1.  Normal coronaries, mild global LV dysfunction, most likely AFib mediated. 2. No mitral regurgitation.  Review of systems complete and found to be negative unless listed in HPI.   Past Medical History:  Diagnosis Date  . Atrial tachycardia (HCC)   . Cardiomyopathy (HCC)   . Hypertension   . Mitral regurgitation   . Paroxysmal atrial flutter (HCC)   . Persistent atrial fibrillation     Current Outpatient Medications  Medication Sig Dispense Refill  . acetaminophen (TYLENOL) 325 MG tablet Take 2 tablets (650 mg total) by mouth every 4 (four) hours as needed for headache or mild pain.    . furosemide (LASIX) 80 MG tablet Take 1 tablet (80 mg total) by mouth daily. Can take extra 40 mg as needed. 30 tablet 3  . metoprolol succinate (TOPROL-XL) 100 MG 24 hr tablet Take 1 tablet (100 mg total) by mouth at bedtime. Take with or immediately following a meal. 30 tablet 6  . rivaroxaban (XARELTO) 20 MG TABS tablet Take 1 tablet (20 mg total) by mouth daily with supper. 30 tablet 6  . sacubitril-valsartan (ENTRESTO) 97-103 MG Take 1 tablet by mouth 2 (two) times daily. 60 tablet 6  . sildenafil (VIAGRA) 25 MG tablet Take 1 tablet (25 mg total) by mouth daily as needed for erectile dysfunction. 10 tablet 0  . spironolactone (ALDACTONE) 25 MG tablet Take 1 tablet (25 mg total) by mouth daily. 90 tablet 3   No  current facility-administered medications for this encounter.     Allergies  Allergen Reactions  . Amiodarone Itching and Rash      Social History   Socioeconomic History  . Marital status: Widowed    Spouse name: Not on file  . Number of children: Not on file  . Years of education: Not on file  . Highest education level: Not on file  Occupational History  . Not on file  Social Needs  . Financial resource strain: Not on file  . Food insecurity:    Worry: Not on file    Inability: Not on file  . Transportation needs:    Medical: Not on file    Non-medical: Not on file    Tobacco Use  . Smoking status: Never Smoker  . Smokeless tobacco: Never Used  Substance and Sexual Activity  . Alcohol use: Yes    Comment: rare  . Drug use: Never  . Sexual activity: Not on file  Lifestyle  . Physical activity:    Days per week: Not on file    Minutes per session: Not on file  . Stress: Not on file  Relationships  . Social connections:    Talks on phone: Not on file    Gets together: Not on file    Attends religious service: Not on file    Active member of club or organization: Not on file    Attends meetings of clubs or organizations: Not on file    Relationship status: Not on file  . Intimate partner violence:    Fear of current or ex partner: Not on file    Emotionally abused: Not on file    Physically abused: Not on file    Forced sexual activity: Not on file  Other Topics Concern  . Not on file  Social History Narrative  . Not on file    Vitals:   03/28/18 0836  BP: 112/72  Pulse: 68  SpO2: 97%  Weight: 123.7 kg (272 lb 12.8 oz)    Wt Readings from Last 3 Encounters:  03/28/18 123.7 kg (272 lb 12.8 oz)  02/21/18 131.9 kg (290 lb 12.8 oz)  01/15/18 125.4 kg (276 lb 6.4 oz)   PHYSICAL EXAM: General: Well appearing. No resp difficulty. HEENT: Normal Neck: Supple. JVP flat. Carotids 2+ bilat; no bruits. No thyromegaly or nodule noted. Cor: PMI nondisplaced. RRR, No M/G/R noted Lungs: CTAB, normal effort. Abdomen: Soft, non-tender, non-distended, no HSM. No bruits or masses. +BS  Extremities: No cyanosis, clubbing, or rash. R and LLE 1+ edema.  Neuro: Alert & orientedx3, cranial nerves grossly intact. moves all 4 extremities w/o difficulty. Affect pleasant   ASSESSMENT & PLAN:  1. Chronic systolic HF. EF 25% as far back as 2009. LHC 2012: normal coronaries.No ICD.  - Echo6/25/19:EF 15-20%, diffuse HK, mild to mod MR - Echo 10/19  EF 45-50%, diffuse HK, mild MR, LA severely dilated, RA mildly dilated, RV normal - NYHA II symptoms -  Volume status much improved.  - Continue lasix 80 daily. Take extra 40 mg PRN. BMET today.  - Continue Toprol XL 100 mg daily  - Continue Entresto 97/103 mg BID.  - Continue spiro 25 mg daily. - Continue TED hose - Needs to schedule PYP. Provided number today so he can schedule. Check myeloma panel.   2. Paroxysmal Afib s/p ablation in 2012 and DCCV 09/12/17 - At an earlier visit we did ECG and caught him going in and out of AF. We  did 14 day monitor 02/2018: only 2% AF with rare PVCs.  Regular on exam today.  - Continue Xarelto and Toprol XL 100 mg daily - Denies bleeding.  - Follows with Dr. Elberta Fortis  3. HTN - Stable.   4. CKD 3 - BMET today with increased lasix dose.   5. Snoring - Has been referred for sleep study.  He looks great. No medication changes today. BMET, myeloma panel. Provided number to schedule PYP scan. Keep follow up with Dr Gala Romney in 2 months.  Alford Highland, NP 03/28/18   Greater than 50% of the 25 minute visit was spent in counseling/coordination of care regarding disease state education, salt/fluid restriction, sliding scale diuretics, and medication compliance.

## 2018-03-28 ENCOUNTER — Telehealth (HOSPITAL_COMMUNITY): Payer: Self-pay

## 2018-03-28 ENCOUNTER — Other Ambulatory Visit: Payer: Self-pay

## 2018-03-28 ENCOUNTER — Other Ambulatory Visit (HOSPITAL_COMMUNITY): Payer: Self-pay | Admitting: Nurse Practitioner

## 2018-03-28 ENCOUNTER — Ambulatory Visit (HOSPITAL_COMMUNITY)
Admission: RE | Admit: 2018-03-28 | Discharge: 2018-03-28 | Disposition: A | Discharge status: Home / Self Care | Payer: 59 | Source: Ambulatory Visit | Attending: Internal Medicine | Admitting: Internal Medicine

## 2018-03-28 VITALS — BP 112/72 | HR 68 | Wt 272.8 lb

## 2018-03-28 DIAGNOSIS — N183 Chronic kidney disease, stage 3 unspecified: Secondary | ICD-10-CM

## 2018-03-28 DIAGNOSIS — I130 Hypertensive heart and chronic kidney disease with heart failure and stage 1 through stage 4 chronic kidney disease, or unspecified chronic kidney disease: Secondary | ICD-10-CM | POA: Insufficient documentation

## 2018-03-28 DIAGNOSIS — I493 Ventricular premature depolarization: Secondary | ICD-10-CM | POA: Diagnosis not present

## 2018-03-28 DIAGNOSIS — I429 Cardiomyopathy, unspecified: Secondary | ICD-10-CM | POA: Insufficient documentation

## 2018-03-28 DIAGNOSIS — I4891 Unspecified atrial fibrillation: Secondary | ICD-10-CM | POA: Diagnosis not present

## 2018-03-28 DIAGNOSIS — Z79899 Other long term (current) drug therapy: Secondary | ICD-10-CM | POA: Insufficient documentation

## 2018-03-28 DIAGNOSIS — R0683 Snoring: Secondary | ICD-10-CM | POA: Diagnosis not present

## 2018-03-28 DIAGNOSIS — Z7901 Long term (current) use of anticoagulants: Secondary | ICD-10-CM | POA: Insufficient documentation

## 2018-03-28 DIAGNOSIS — I10 Essential (primary) hypertension: Secondary | ICD-10-CM

## 2018-03-28 DIAGNOSIS — I5022 Chronic systolic (congestive) heart failure: Secondary | ICD-10-CM

## 2018-03-28 DIAGNOSIS — I4819 Other persistent atrial fibrillation: Secondary | ICD-10-CM | POA: Insufficient documentation

## 2018-03-28 LAB — BASIC METABOLIC PANEL
ANION GAP: 5 (ref 5–15)
BUN: 21 mg/dL (ref 8–23)
CALCIUM: 8.8 mg/dL — AB (ref 8.9–10.3)
CO2: 26 mmol/L (ref 22–32)
CREATININE: 1.85 mg/dL — AB (ref 0.61–1.24)
Chloride: 105 mmol/L (ref 98–111)
GFR calc Af Amer: 43 mL/min — ABNORMAL LOW (ref >60)
GFR calc non Af Amer: 37 mL/min — ABNORMAL LOW (ref >60)
GLUCOSE: 96 mg/dL (ref 70–99)
Potassium: 4 mmol/L (ref 3.5–5.1)
Sodium: 136 mmol/L (ref 135–145)

## 2018-03-28 NOTE — Patient Instructions (Addendum)
Labs today We will only contact you if something comes back abnormal or we need to make some changes. Otherwise no news is good news!  Please call Nuclear Medicine to schedule your PYP scan. [phone]  Please call Dr. Norris Cross office to schedule your appointment for a sleep study. [phone]

## 2018-03-28 NOTE — Telephone Encounter (Signed)
Pt aware to hold lasix x1 day, THEN resume at dose of 60mg  daily (previously on 80mg  daily) thereafter. Pt will RTO on Wednesday 930a for repeat BMET

## 2018-03-28 NOTE — Telephone Encounter (Signed)
-----   Message from Alford Highland, NP sent at 03/28/2018 10:12 AM EST ----- Renal function is up. Have him hold lasix for 1 day, then decrease lasix to 60 mg daily. Repeat BMET in 1 week. Thanks!

## 2018-03-30 LAB — MULTIPLE MYELOMA PANEL, SERUM
ALBUMIN SERPL ELPH-MCNC: 3.2 g/dL (ref 2.9–4.4)
ALPHA2 GLOB SERPL ELPH-MCNC: 0.6 g/dL (ref 0.4–1.0)
Albumin/Glob SerPl: 1.1 (ref 0.7–1.7)
Alpha 1: 0.2 g/dL (ref 0.0–0.4)
B-GLOBULIN SERPL ELPH-MCNC: 0.8 g/dL (ref 0.7–1.3)
GAMMA GLOB SERPL ELPH-MCNC: 1.5 g/dL (ref 0.4–1.8)
GLOBULIN, TOTAL: 3.1 g/dL (ref 2.2–3.9)
IGG (IMMUNOGLOBIN G), SERUM: 1645 mg/dL — AB (ref 700–1600)
IgA: 295 mg/dL (ref 61–437)
IgM (Immunoglobulin M), Srm: 120 mg/dL (ref 20–172)
Total Protein ELP: 6.3 g/dL (ref 6.0–8.5)

## 2018-04-04 ENCOUNTER — Ambulatory Visit (HOSPITAL_COMMUNITY)
Admission: RE | Admit: 2018-04-04 | Discharge: 2018-04-04 | Disposition: A | Discharge status: Home / Self Care | Payer: 59 | Source: Ambulatory Visit | Attending: Cardiology | Admitting: Cardiology

## 2018-04-04 DIAGNOSIS — I5022 Chronic systolic (congestive) heart failure: Secondary | ICD-10-CM | POA: Diagnosis present

## 2018-04-04 LAB — BASIC METABOLIC PANEL
ANION GAP: 10 (ref 5–15)
BUN: 23 mg/dL (ref 8–23)
CHLORIDE: 104 mmol/L (ref 98–111)
CO2: 25 mmol/L (ref 22–32)
CREATININE: 1.75 mg/dL — AB (ref 0.61–1.24)
Calcium: 8.6 mg/dL — ABNORMAL LOW (ref 8.9–10.3)
GFR calc Af Amer: 46 mL/min — ABNORMAL LOW (ref >60)
GFR calc non Af Amer: 39 mL/min — ABNORMAL LOW (ref >60)
GLUCOSE: 92 mg/dL (ref 70–99)
POTASSIUM: 4.1 mmol/L (ref 3.5–5.1)
Sodium: 139 mmol/L (ref 135–145)

## 2018-04-05 ENCOUNTER — Telehealth (HOSPITAL_COMMUNITY): Payer: Self-pay | Admitting: *Deleted

## 2018-04-05 NOTE — Telephone Encounter (Signed)
Ok to hold for 24 hours before and after.

## 2018-04-05 NOTE — Telephone Encounter (Signed)
Dr.Redd called requesting recommendations on holding Xarelto for pts tooth extractions. He has a few teeth that need to be extracted and she wants to know how many days to hold Xarelto before extractions and when should he resume Xarelto. She asks that we fax orders to the number below.  Message routed to Dr.Bensimhon for advice.   Phone number [phone] Fax [phone]

## 2018-04-09 ENCOUNTER — Other Ambulatory Visit (HOSPITAL_COMMUNITY): Payer: Self-pay | Admitting: Student

## 2018-04-09 NOTE — Telephone Encounter (Signed)
Called Dr.Redds office they are aware of recommendations.

## 2018-05-05 IMAGING — DX DG CHEST 1V PORT
1 series · 1 of 1 positions shown · non-contrast
Comparison: 06/09/2010

CLINICAL DATA: Tachycardia. History of atrial fibrillation
hypertension.

EXAM:
PORTABLE CHEST 1 VIEW

[chest ap]
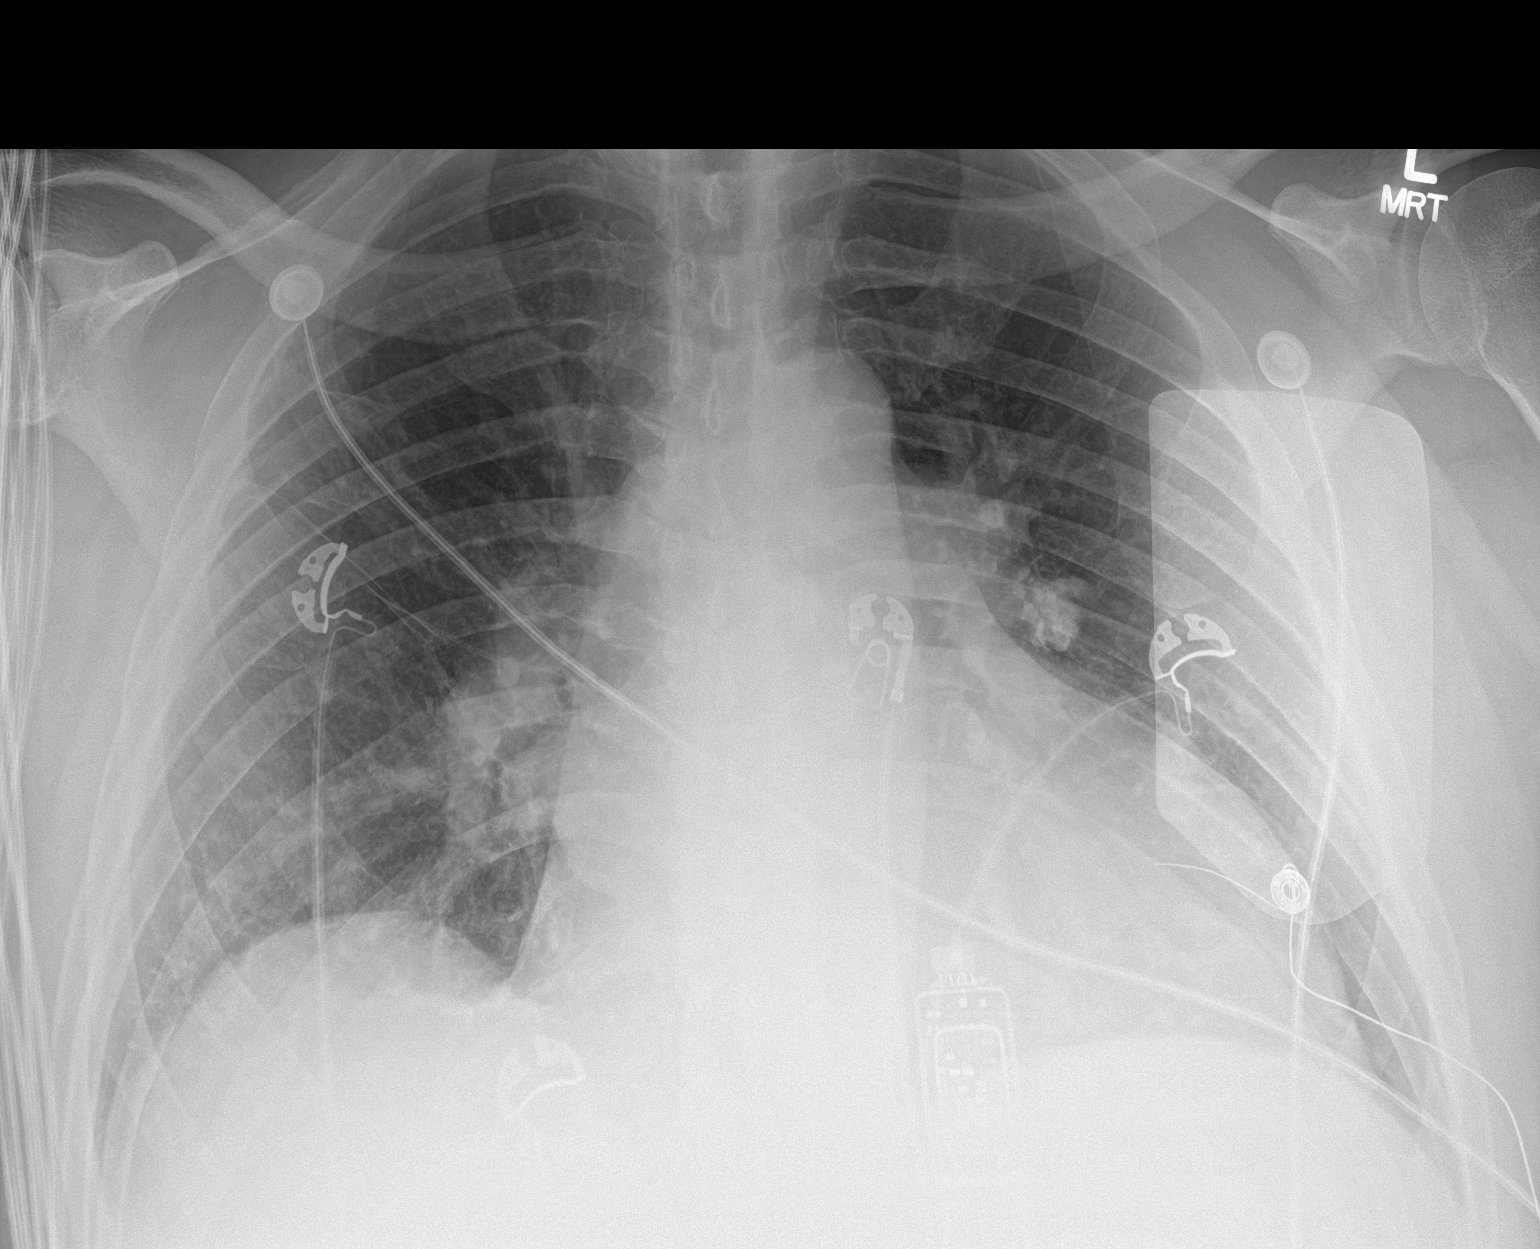

[1 of 1 positions shown; findings below may reference images not displayed]

FINDINGS: Grossly unchanged enlarged cardiac silhouette and mediastinal
contours with calcified left hilar lymph nodes, the sequela of prior
granulomatous infection. Epicardial pacer pads overlie the cardiac
apex. Mild pulmonary venous congestion without frank evidence of
edema. Grossly unchanged minimal bilateral infrahilar opacities
favored to represent atelectasis. No new focal airspace opacities.
No definite pleural effusion or pneumothorax. No acute osseus
abnormalities.
IMPRESSION: Cardiomegaly without definitive evidence of edema on this AP
portable examination.

## 2018-05-24 ENCOUNTER — Ambulatory Visit (HOSPITAL_BASED_OUTPATIENT_CLINIC_OR_DEPARTMENT_OTHER)
Admission: RE | Admit: 2018-05-24 | Discharge: 2018-05-24 | Disposition: A | Discharge status: Home / Self Care | Payer: 59 | Source: Ambulatory Visit | Attending: Internal Medicine | Admitting: Internal Medicine

## 2018-05-24 ENCOUNTER — Emergency Department (HOSPITAL_COMMUNITY)
Admission: EM | Admit: 2018-05-24 | Discharge: 2018-05-24 | Disposition: A | Discharge status: Home / Self Care | Payer: 59 | Attending: Emergency Medicine | Admitting: Emergency Medicine

## 2018-05-24 ENCOUNTER — Emergency Department (HOSPITAL_COMMUNITY): Payer: 59

## 2018-05-24 ENCOUNTER — Other Ambulatory Visit: Payer: Self-pay

## 2018-05-24 VITALS — BP 80/60 | HR 124 | Wt 282.6 lb

## 2018-05-24 DIAGNOSIS — Z7901 Long term (current) use of anticoagulants: Secondary | ICD-10-CM | POA: Insufficient documentation

## 2018-05-24 DIAGNOSIS — N183 Chronic kidney disease, stage 3 (moderate): Secondary | ICD-10-CM | POA: Insufficient documentation

## 2018-05-24 DIAGNOSIS — I4892 Unspecified atrial flutter: Secondary | ICD-10-CM

## 2018-05-24 DIAGNOSIS — I493 Ventricular premature depolarization: Secondary | ICD-10-CM | POA: Insufficient documentation

## 2018-05-24 DIAGNOSIS — I130 Hypertensive heart and chronic kidney disease with heart failure and stage 1 through stage 4 chronic kidney disease, or unspecified chronic kidney disease: Secondary | ICD-10-CM | POA: Diagnosis not present

## 2018-05-24 DIAGNOSIS — I4819 Other persistent atrial fibrillation: Secondary | ICD-10-CM

## 2018-05-24 DIAGNOSIS — Z79899 Other long term (current) drug therapy: Secondary | ICD-10-CM

## 2018-05-24 DIAGNOSIS — I4891 Unspecified atrial fibrillation: Secondary | ICD-10-CM

## 2018-05-24 DIAGNOSIS — I429 Cardiomyopathy, unspecified: Secondary | ICD-10-CM

## 2018-05-24 DIAGNOSIS — I5022 Chronic systolic (congestive) heart failure: Secondary | ICD-10-CM

## 2018-05-24 DIAGNOSIS — I471 Supraventricular tachycardia: Secondary | ICD-10-CM | POA: Diagnosis not present

## 2018-05-24 DIAGNOSIS — R000 Tachycardia, unspecified: Secondary | ICD-10-CM | POA: Diagnosis present

## 2018-05-24 LAB — CBC
HCT: 34.9 % — ABNORMAL LOW (ref 39.0–52.0)
HEMATOCRIT: 33.6 % — AB (ref 39.0–52.0)
HEMOGLOBIN: 10.8 g/dL — AB (ref 13.0–17.0)
Hemoglobin: 10.9 g/dL — ABNORMAL LOW (ref 13.0–17.0)
MCH: 29.6 pg (ref 26.0–34.0)
MCH: 30.5 pg (ref 26.0–34.0)
MCHC: 31.2 g/dL (ref 30.0–36.0)
MCHC: 32.1 g/dL (ref 30.0–36.0)
MCV: 94.8 fL (ref 80.0–100.0)
MCV: 94.9 fL (ref 80.0–100.0)
Platelets: 192 10*3/uL (ref 150–400)
Platelets: 187 10*3/uL (ref 150–400)
RBC: 3.68 MIL/uL — ABNORMAL LOW (ref 4.22–5.81)
RBC: 3.54 MIL/uL — ABNORMAL LOW (ref 4.22–5.81)
RDW: 13.9 % (ref 11.5–15.5)
RDW: 14 % (ref 11.5–15.5)
WBC: 4 10*3/uL (ref 4.0–10.5)
WBC: 4.1 10*3/uL (ref 4.0–10.5)
nRBC: 0 % (ref 0.0–0.2)
nRBC: 0 % (ref 0.0–0.2)

## 2018-05-24 LAB — BASIC METABOLIC PANEL
Anion gap: 7 (ref 5–15)
Anion gap: 9 (ref 5–15)
BUN: 18 mg/dL (ref 8–23)
BUN: 19 mg/dL (ref 8–23)
CO2: 24 mmol/L (ref 22–32)
CO2: 24 mmol/L (ref 22–32)
CREATININE: 1.61 mg/dL — AB (ref 0.61–1.24)
Calcium: 8.6 mg/dL — ABNORMAL LOW (ref 8.9–10.3)
Calcium: 8.6 mg/dL — ABNORMAL LOW (ref 8.9–10.3)
Chloride: 106 mmol/L (ref 98–111)
Chloride: 104 mmol/L (ref 98–111)
Creatinine, Ser: 1.58 mg/dL — ABNORMAL HIGH (ref 0.61–1.24)
GFR calc Af Amer: 51 mL/min — ABNORMAL LOW (ref >60)
GFR calc Af Amer: 52 mL/min — ABNORMAL LOW (ref >60)
GFR calc non Af Amer: 44 mL/min — ABNORMAL LOW (ref >60)
GFR calc non Af Amer: 45 mL/min — ABNORMAL LOW (ref >60)
Glucose, Bld: 96 mg/dL (ref 70–99)
Glucose, Bld: 94 mg/dL (ref 70–99)
Potassium: 3.7 mmol/L (ref 3.5–5.1)
Potassium: 3.8 mmol/L (ref 3.5–5.1)
Sodium: 137 mmol/L (ref 135–145)
Sodium: 137 mmol/L (ref 135–145)

## 2018-05-24 LAB — I-STAT TROPONIN, ED
Troponin i, poc: 0.01 ng/mL (ref 0.00–0.08)
Troponin i, poc: 0.01 ng/mL (ref 0.00–0.08)

## 2018-05-24 LAB — MAGNESIUM
Magnesium: 2 mg/dL (ref 1.7–2.4)
Magnesium: 1.9 mg/dL (ref 1.7–2.4)

## 2018-05-24 LAB — PROTIME-INR
INR: 1.5 — ABNORMAL HIGH (ref 0.8–1.2)
Prothrombin Time: 17.8 seconds — ABNORMAL HIGH (ref 11.4–15.2)

## 2018-05-24 MED ORDER — ETOMIDATE 2 MG/ML IV SOLN
INTRAVENOUS | Status: AC | PRN
  Administered 2018-05-24: 8 mg via INTRAVENOUS

## 2018-05-24 MED ORDER — ONDANSETRON HCL 4 MG/2ML IJ SOLN
INTRAMUSCULAR | Status: AC
  Administered 2018-05-24: 17:00:00
  Filled 2018-05-24: qty 2

## 2018-05-24 MED ORDER — FENTANYL CITRATE (PF) 100 MCG/2ML IJ SOLN
INTRAMUSCULAR | Status: AC | PRN
  Administered 2018-05-24: 50 ug via INTRAVENOUS

## 2018-05-24 MED ORDER — FENTANYL CITRATE (PF) 100 MCG/2ML IJ SOLN
INTRAMUSCULAR | Status: AC
  Filled 2018-05-24: qty 2

## 2018-05-24 MED ORDER — ETOMIDATE 2 MG/ML IV SOLN
15.0000 mg | Freq: Once | INTRAVENOUS | Status: DC
  Filled 2018-05-24: qty 10

## 2018-05-24 MED ORDER — PHENYLEPHRINE 40 MCG/ML (10ML) SYRINGE FOR IV PUSH (FOR BLOOD PRESSURE SUPPORT)
PREFILLED_SYRINGE | INTRAVENOUS | Status: AC
  Filled 2018-05-24: qty 10

## 2018-05-24 MED ORDER — ETOMIDATE 2 MG/ML IV SOLN: 15.0000 mg | Freq: Once | INTRAVENOUS | Status: DC

## 2018-05-24 MED ORDER — FUROSEMIDE 10 MG/ML IJ SOLN
80.0000 mg | Freq: Once | INTRAMUSCULAR | Status: AC
  Administered 2018-05-24: 80 mg via INTRAVENOUS
  Filled 2018-05-24: qty 8

## 2018-05-24 NOTE — ED Notes (Signed)
Pt is not ale to sign bc pad is broken. Acknowledged papers and discharge orders.

## 2018-05-24 NOTE — ED Notes (Signed)
Patient transported to X-ray 

## 2018-05-24 NOTE — CV Procedure (Signed)
    DIRECT CURRENT CARDIOVERSION  NAME:  [name]   MRN: [id number] DOB:  May 05, 1950   ADMIT DATE: 05/24/2018   INDICATIONS: Atrial flutter   PROCEDURE:   Informed consent was obtained prior to the procedure. The risks, benefits and alternatives for the procedure were discussed and the patient comprehended these risks. Once an appropriate time out was taken, the patient had the defibrillator pads placed in the anterior and posterior position. The patient then underwent sedation by the ER servvice under the direction of Dr. Rhunette Croft. Once an appropriate level of sedation was achieved, the patient received a single biphasic, synchronized 120J shock with prompt conversion to sinus rhythm. No apparent complications.  Arvilla Meres, MD  6:05 PM

## 2018-05-24 NOTE — Sedation Documentation (Addendum)
Shock delivered at 120

## 2018-05-24 NOTE — Progress Notes (Signed)
Advanced Heart Failure Clinic Note   PCP: Renaye Rakers, MD PCP-Cardiologist: No primary care provider on file.  HF: Dr Gala Romney EP: Elberta Fortis  HPI: [name] is a 68 y.o. male with a history of persistent afib (s/p ablation 2012, DCCV 09/12/17), HTN, and chronic systolic HF.   Underwent DC-CV of AF on 09/12/17.   Admitted 6/27-09/18/17 from afib clinic with volume overload and AKI. HF team consulted. Echo showed EF 15-20%. Diuresed 29 lbs with IV lasix. Transitioned to lasix 80/40. HF medications optimized as able. AKI resolved with diuresis. DC weight: 263 lbs.   Here for f/u. Overall feeling pretty well. Working [employer]. No SOB, orthopnea or PND. Gets dizzy when he bends over. Denies palpitations. Takes xarelto everyday without missing a dose   Cardiac Studies: TEE6/25/19: EF 15-20%, diffuse HK, mild to mod MR, RV systolic function reduced, RA and LA dilated Echo 10/19 EF 45-50%, diffuse HK, mild MR, LA severely dilated, RA mildly dilated, RV normal  14-day monitor 12/19  1. Predominant underlying rhythm was Sinus Rhythm. Average HR 74 bpm 2. One run of NSVT lasting 4 beats 3. Atrial Fibrillation occurred (2% burden), ranging from 68-195 bpm (avg of 116 bpm), the longest lasting 4 hours 34 mins with an avg rate of 115 bpm.  4. Rare PVCs  LHC 06/12/2010 1. Normal coronaries, mild global LV dysfunction, most likely AFib mediated. 2. No mitral regurgitation.  Review of systems complete and found to be negative unless listed in HPI.   Past Medical History:  Diagnosis Date  . Atrial tachycardia (HCC)   . Cardiomyopathy (HCC)   . Hypertension   . Mitral regurgitation   . Paroxysmal atrial flutter (HCC)   . Persistent atrial fibrillation     Current Outpatient Medications  Medication Sig Dispense Refill  . acetaminophen (TYLENOL) 325 MG tablet Take 2 tablets (650 mg total) by mouth every 4 (four) hours as needed for headache or mild pain.    . furosemide (LASIX) 80 MG tablet  Take 1 tablet (80 mg total) by mouth daily. Can take extra 40 mg as needed. 30 tablet 3  . metoprolol succinate (TOPROL-XL) 100 MG 24 hr tablet TAKE 1 TABLET BY MOUTH AT BEDTIME TAKE WITH OR IMMEDIATELY FOLLOWING A MEAL 30 tablet 6  . sacubitril-valsartan (ENTRESTO) 97-103 MG Take 1 tablet by mouth 2 (two) times daily. 60 tablet 6  . sildenafil (VIAGRA) 25 MG tablet Take 1 tablet (25 mg total) by mouth daily as needed for erectile dysfunction. 10 tablet 0  . spironolactone (ALDACTONE) 25 MG tablet Take 1 tablet (25 mg total) by mouth daily. 90 tablet 3  . XARELTO 20 MG TABS tablet TAKE 1 TABLET(20 MG) BY MOUTH DAILY WITH SUPPER 30 tablet 6   No current facility-administered medications for this encounter.     Allergies  Allergen Reactions  . Amiodarone Itching and Rash      Social History   Socioeconomic History  . Marital status: Widowed    Spouse name: Not on file  . Number of children: Not on file  . Years of education: Not on file  . Highest education level: Not on file  Occupational History  . Not on file  Social Needs  . Financial resource strain: Not on file  . Food insecurity:    Worry: Not on file    Inability: Not on file  . Transportation needs:    Medical: Not on file    Non-medical: Not on file  Tobacco Use  .  Smoking status: Never Smoker  . Smokeless tobacco: Never Used  Substance and Sexual Activity  . Alcohol use: Yes    Comment: rare  . Drug use: Never  . Sexual activity: Not on file  Lifestyle  . Physical activity:    Days per week: Not on file    Minutes per session: Not on file  . Stress: Not on file  Relationships  . Social connections:    Talks on phone: Not on file    Gets together: Not on file    Attends religious service: Not on file    Active member of club or organization: Not on file    Attends meetings of clubs or organizations: Not on file    Relationship status: Not on file  . Intimate partner violence:    Fear of current or ex  partner: Not on file    Emotionally abused: Not on file    Physically abused: Not on file    Forced sexual activity: Not on file  Other Topics Concern  . Not on file  Social History Narrative  . Not on file    Vitals:   05/24/18 1517  BP: (!) 80/60  Pulse: (!) 124  SpO2: 94%  Weight: 128.2 kg (282 lb 9.6 oz)    Wt Readings from Last 3 Encounters:  05/24/18 128.2 kg (282 lb 9.6 oz)  03/28/18 123.7 kg (272 lb 12.8 oz)  02/21/18 131.9 kg (290 lb 12.8 oz)   PHYSICAL EXAM: General:  Sitting on exam table No resp difficulty HEENT: normal Neck: supple. JVP 9. Carotids 2+ bilat; no bruits. No lymphadenopathy or thryomegaly appreciated. Cor: PMI nondisplaced. Tachy regualr  No rubs, gallops or murmurs. Lungs: clear Abdomen: soft, nontender, nondistended. No hepatosplenomegaly. No bruits or masses. Good bowel sounds. Extremities: no cyanosis, clubbing, rash 2+edema Neuro: alert & orientedx3, cranial nerves grossly intact. moves all 4 extremities w/o difficulty. Affect pleasant  ECG atrial tach/flutter 156 Personally reviewed   ASSESSMENT & PLAN:  1. Chronic systolic HF. EF 25% as far back as 2009. LHC 2012: normal coronaries.No ICD.  - Echo6/25/19:EF 15-20%, diffuse HK, mild to mod MR - Echo 10/19  EF 45-50%, diffuse HK, mild MR, LA severely dilated, RA mildly dilated, RV normal - NYHA II symptoms - Volume status up on exam in setting of AFL with RVR. Will take to ER for DC-CV and give dose of IV lasix in ER. Will increase home diuretics for 2-3 days until swelling resolves.  - Continue alsix 80 daily  Take extra 40 mg for 2-3 days PRN. Watch fluid and salt intake.  - Continue Toprol XL 100 mg daily  - Continue Entresto 97/103 mg BID.  - Continue spiro 25 mg daily. - Continue TED hose  2. Paroxysmal Afib s/p ablation in 2012 and DCCV 09/12/17 - He has recurrent AFL today with RVR with low BP. Will send to ER for DC-CV and give dose of IV lasix in ER.  - Recent 14 day  monitor only 2% AF with rare PVCs.   - Continue Xarelto and Toprol XL 100 mg daily - Follows with Dr. Elberta Fortis - Allergic to amio will need to discuss AA options with Dr. Elberta Fortis  3. HTN - BP low in setting of AFL with RVR  4. CKD 3 - Creatinine has been fluctuation 1.3-1.8. Check labs today  5. Snoring - Refer for sleep study. Still has not had study.   Total time spent 45 minutes. Over half that time spent  discussing above.    Arvilla Meres, MD 05/24/18

## 2018-05-24 NOTE — ED Provider Notes (Addendum)
[facility] EMERGENCY DEPARTMENT Provider Note   CSN: [id number] Arrival date & time: 05/24/18  1620    History   Chief Complaint Chief Complaint  Patient presents with  . Tachycardia    HPI [name] is a 68 y.o. male.      Illness  Location:  Patient at heart clinic today and found to be in SVT. Quality:  Patient denies any chest pain, syncope-like symptoms. Severity:  Moderate Onset quality:  Unable to specify Timing:  Constant Progression:  Unchanged Chronicity:  New Context:  Patient has significant past medical history of A. fib, a flutter with block and 3 past cardioversions. Relieved by:  Nothing Worsened by:  Nothing Associated symptoms: no abdominal pain, no chest pain, no cough, no ear pain, no fever, no loss of consciousness, no rash, no shortness of breath, no sore throat and no vomiting     Past Medical History:  Diagnosis Date  . Atrial tachycardia (HCC)   . Cardiomyopathy (HCC)   . Hypertension   . Mitral regurgitation   . Paroxysmal atrial flutter (HCC)   . Persistent atrial fibrillation     Patient Active Problem List   Diagnosis Date Noted  . Acute on chronic systolic CHF (congestive heart failure) (HCC) 09/14/2017  . Atypical atrial flutter (HCC)   . Atrial fibrillation (HCC) 07/07/2012  . Long term (current) use of anticoagulants 07/07/2012    Past Surgical History:  Procedure Laterality Date  . CARDIAC ELECTROPHYSIOLOGY STUDY AND ABLATION  2012   Dr. Riccardo Dubin  . CARDIOVERSION N/A 09/12/2017   Procedure: CARDIOVERSION;  Surgeon: Jake Bathe, MD;  Location: [facility] ENDOSCOPY;  Service: Cardiovascular;  Laterality: N/A;  . LOOP RECORDER IMPLANT  06/11/2010  . TEE WITHOUT CARDIOVERSION N/A 09/12/2017   Procedure: TRANSESOPHAGEAL ECHOCARDIOGRAM (TEE);  Surgeon: Jake Bathe, MD;  Location: [facility] ENDOSCOPY;  Service: Cardiovascular;  Laterality: N/A;        Home Medications    Prior to Admission medications     Medication Sig Start Date End Date Taking? Authorizing Provider  acetaminophen (TYLENOL) 325 MG tablet Take 2 tablets (650 mg total) by mouth every 4 (four) hours as needed for headache or mild pain. 09/18/17  Yes Graciella Freer, PA-C  furosemide (LASIX) 80 MG tablet Take 1 tablet (80 mg total) by mouth daily. Can take extra 40 mg as needed. Patient taking differently: Take 40-80 mg by mouth See admin instructions. Take 80 mg by mouth once a day and an additional 40 mg if needed for edema or fluid 02/21/18  Yes Bensimhon, Bevelyn Buckles, MD  metoprolol succinate (TOPROL-XL) 100 MG 24 hr tablet TAKE 1 TABLET BY MOUTH AT BEDTIME TAKE WITH OR IMMEDIATELY FOLLOWING A MEAL Patient taking differently: Take 100 mg by mouth daily.  04/09/18  Yes Bensimhon, Bevelyn Buckles, MD  sacubitril-valsartan (ENTRESTO) 97-103 MG Take 1 tablet by mouth 2 (two) times daily. 01/15/18  Yes Bensimhon, Bevelyn Buckles, MD  sildenafil (VIAGRA) 25 MG tablet Take 1 tablet (25 mg total) by mouth daily as needed for erectile dysfunction. 01/02/18  Yes Bensimhon, Bevelyn Buckles, MD  spironolactone (ALDACTONE) 25 MG tablet Take 1 tablet (25 mg total) by mouth daily. 11/15/17  Yes Graciella Freer, PA-C  XARELTO 20 MG TABS tablet TAKE 1 TABLET(20 MG) BY MOUTH DAILY WITH SUPPER Patient taking differently: Take 20 mg by mouth daily with supper. "High risk med: Anticoagulant.  Crushed Xarelto can be given down a G-tube but NOT a J-Tube" 03/29/18  Yes Bensimhon, Bevelyn Buckles, MD    Family History No family history on file.  Social History Social History   Tobacco Use  . Smoking status: Never Smoker  . Smokeless tobacco: Never Used  Substance Use Topics  . Alcohol use: Yes    Comment: rare  . Drug use: Never     Allergies   Amiodarone   Review of Systems Review of Systems  Constitutional: Negative for chills and fever.  HENT: Negative for ear pain and sore throat.   Eyes: Negative for pain and visual disturbance.  Respiratory:  Negative for cough and shortness of breath.   Cardiovascular: Negative for chest pain and palpitations.  Gastrointestinal: Negative for abdominal pain and vomiting.  Genitourinary: Negative for dysuria and hematuria.  Musculoskeletal: Negative for arthralgias and back pain.  Skin: Negative for color change and rash.  Neurological: Negative for seizures, loss of consciousness and syncope.  All other systems reviewed and are negative.    Physical Exam Updated Vital Signs BP 96/70   Pulse (!) 59   Temp 98.1 F (36.7 C) (Oral)   Resp 19   Ht 6\' 4"  (1.93 m)   Wt 130.6 kg   SpO2 100%   BMI 35.06 kg/m   Physical Exam Vitals signs and nursing note reviewed.  Constitutional:      Appearance: He is well-developed.     Comments: Patient resting comfortably, no acute distress.  HENT:     Head: Normocephalic and atraumatic.  Eyes:     Conjunctiva/sclera: Conjunctivae normal.  Neck:     Musculoskeletal: Neck supple.  Cardiovascular:     Rate and Rhythm: Regular rhythm. Tachycardia present.     Heart sounds: No murmur.  Pulmonary:     Effort: Pulmonary effort is normal. No respiratory distress.     Breath sounds: Normal breath sounds.  Abdominal:     Palpations: Abdomen is soft.     Tenderness: There is no abdominal tenderness.  Skin:    General: Skin is warm and dry.     Capillary Refill: Capillary refill takes less than 2 seconds.  Neurological:     General: No focal deficit present.     Mental Status: He is alert.  Psychiatric:        Mood and Affect: Mood normal.      ED Treatments / Results  Labs (all labs ordered are listed, but only abnormal results are displayed) Labs Reviewed  BASIC METABOLIC PANEL - Abnormal; Notable for the following components:      Result Value   Creatinine, Ser 1.58 (*)    Calcium 8.6 (*)    GFR calc non Af Amer 45 (*)    GFR calc Af Amer 52 (*)    All other components within normal limits  CBC - Abnormal; Notable for the following  components:   RBC 3.54 (*)    Hemoglobin 10.8 (*)    HCT 33.6 (*)    All other components within normal limits  PROTIME-INR - Abnormal; Notable for the following components:   Prothrombin Time 17.8 (*)    INR 1.5 (*)    All other components within normal limits  MAGNESIUM  I-STAT TROPONIN, ED  I-STAT TROPONIN, ED    EKG EKG Interpretation  Date/Time:  Thursday May 24 2018 16:30:07 EST Ventricular Rate:  153 PR Interval:    QRS Duration: 166 QT Interval:  285 QTC Calculation: 455 R Axis:   52 Text Interpretation:  sinus tachycardis vs atrial flutter Nonspecific  intraventricular conduction delay Repol abnrm suggests ischemia, anterolateral No acute changes TWI in the lateral leads are new Confirmed by Derwood Kaplan [phone]) on 05/24/2018 4:42:04 PM   Radiology Dg Chest 2 View  Result Date: 05/24/2018 CLINICAL DATA:  Initial evaluation for abnormal heart rate, shortness of breath. EXAM: CHEST - 2 VIEW COMPARISON:  Prior radiograph from 06/07/2017. FINDINGS: Loop recorder overlies the left chest. Moderate cardiomegaly is unchanged. Mediastinal silhouette normal. Elevation of the right hemidiaphragm. Associated right basilar streaky opacities most likely reflect atelectasis. No focal infiltrates. No edema or effusion. No pneumothorax. No acute osseous finding. IMPRESSION: 1. Elevation of the right hemidiaphragm with associated right basilar atelectasis. 2. Stable cardiomegaly without pulmonary edema. Electronically Signed   By: Rise Mu M.D.   On: 05/24/2018 18:22    Procedures .Cardioversion Date/Time: 05/24/2018 5:34 PM Performed by: Dahlia Client, MD Authorized by: Dahlia Client, MD   .Sedation Date/Time: 05/24/2018 5:35 PM Performed by: Dahlia Client, MD Authorized by: Dahlia Client, MD   Consent:    Consent obtained:  Written   Consent given by:  Patient   Risks discussed:  Allergic reaction, inadequate sedation, prolonged hypoxia resulting in organ  damage, prolonged sedation necessitating reversal, respiratory compromise necessitating ventilatory assistance and intubation and vomiting Universal protocol:    Immediately prior to procedure a time out was called: yes   Indications:    Procedure performed:  Cardioversion Pre-sedation assessment:    Time since last food or drink:  4   ASA classification: class 1 - normal, healthy patient     Neck mobility: normal     Mouth opening:  3 or more finger widths   Thyromental distance:  4 finger widths   Mallampati score:  I - soft palate, uvula, fauces, pillars visible   Pre-sedation assessments completed and reviewed: airway patency     Pre-sedation assessment completed:  05/24/2018 4:51 PM Immediate pre-procedure details:    Reassessment: Patient reassessed immediately prior to procedure     Reviewed: vital signs, relevant labs/tests and NPO status     Verified: bag valve mask available, emergency equipment available, intubation equipment available, IV patency confirmed and oxygen available   Procedure details (see MAR for exact dosages):    Preoxygenation:  Nasal cannula   Sedation:  Etomidate   Analgesia:  Fentanyl   Intra-procedure monitoring:  Blood pressure monitoring, cardiac monitor, continuous pulse oximetry, continuous capnometry, frequent LOC assessments and frequent vital sign checks   Intra-procedure events: none     Intra-procedure management:  Airway repositioning   Total Provider sedation time (minutes):  10 Post-procedure details:    Post-sedation assessment completed:  05/24/2018 5:37 PM   Attendance: Constant attendance by certified staff until patient recovered     Recovery: Patient returned to pre-procedure baseline     Patient is stable for discharge or admission: yes     Patient tolerance:  Tolerated well, no immediate complications   (including critical care time)  Medications Ordered in ED Medications  etomidate (AMIDATE) injection 15 mg (has no administration  in time range)  etomidate (AMIDATE) injection 15 mg (has no administration in time range)  phenylephrine 0.4-0.9 MG/10ML-% injection (  Not Given 05/24/18 1742)  ondansetron (ZOFRAN) 4 MG/2ML injection (  Given 05/24/18 1700)  fentaNYL (SUBLIMAZE) injection (50 mcg Intravenous Given 05/24/18 1701)  etomidate (AMIDATE) injection (8 mg Intravenous Given 05/24/18 1702)  furosemide (LASIX) injection 80 mg (80 mg Intravenous Given 05/24/18 1725)     Initial Impression / Assessment and Plan /  ED Course  I have reviewed the triage vital signs and the nursing notes.  Pertinent labs & imaging results that were available during my care of the patient were reviewed by me and considered in my medical decision making (see chart for details).         68 y.o. malewith a history of persistent afib (s/p ablation 2012, DCCV 09/12/17), HTN, CKD 3, and chronic systolic HF who presents with SVT from heart failure clinic.  Patient denies any shortness of breath, syncopal-like symptoms or chest pain.  Patient mildly hypotensive, responsive to fluids here in the emergency department, given 1 L of normal saline.  Cardiology at bedside; performed synchronized cardioversion with 150 J without complication using etomidate as procedure sedative.  Cardiology recommends of the patient go home after receiving 80 of Lasix here in the emergency department.  Patient in agreement with this plan.  Patient after procedure on reassessment resting comfortably, no acute distress.  Currently rate controlled A. fib on the monitor.  Patient given strict return precautions if any symptoms develop.  Cardiology does not recommend any other medication changes.  Discussed with IT help desk to remove cardioversion from this note due to cardiologist billing for the cardioversion.  Unable to do so.  IT is going to submit a ticket to take out my billing for the procedure to ensure that there is only one billing for a cardioversion for the  patient.  Patient discharged in stable additional stable vital signs.  The above care was discussed and agreed upon by my attending physician.  Final Clinical Impressions(s) / ED Diagnoses   Final diagnoses:  SVT (supraventricular tachycardia) [facility])    ED Discharge Orders         Ordered    Amb referral to AFIB Clinic     05/24/18 1646           Dahlia Client, MD 05/24/18 1937    Dahlia Client, MD 05/24/18 9024    Dahlia Client, MD 05/24/18 Rushie Goltz    Derwood Kaplan, MD 05/24/18 2102

## 2018-05-24 NOTE — ED Triage Notes (Signed)
[facility] for [facility]. Hx afib, was found to have high heart rate. Denies chest pain. Sts he feels a little winded.

## 2018-06-18 ENCOUNTER — Other Ambulatory Visit (HOSPITAL_COMMUNITY): Payer: Self-pay | Admitting: Internal Medicine

## 2018-08-20 ENCOUNTER — Other Ambulatory Visit (HOSPITAL_COMMUNITY): Payer: Self-pay | Admitting: Internal Medicine

## 2018-10-29 ENCOUNTER — Other Ambulatory Visit (HOSPITAL_COMMUNITY): Payer: Self-pay | Admitting: Internal Medicine

## 2018-10-30 ENCOUNTER — Other Ambulatory Visit (HOSPITAL_COMMUNITY): Payer: Self-pay | Admitting: Student

## 2018-10-31 ENCOUNTER — Other Ambulatory Visit (HOSPITAL_COMMUNITY): Payer: Self-pay | Admitting: Internal Medicine

## 2018-11-09 ENCOUNTER — Other Ambulatory Visit: Payer: Self-pay

## 2018-11-09 DIAGNOSIS — Z20822 Contact with and (suspected) exposure to covid-19: Secondary | ICD-10-CM

## 2018-11-10 LAB — NOVEL CORONAVIRUS, NAA: SARS-CoV-2, NAA: NOT DETECTED

## 2019-04-21 IMAGING — DX DG CHEST 2V
2 series · 2 of 2 positions shown · non-contrast
Comparison: Prior radiograph from 06/07/2017.

CLINICAL DATA: Initial evaluation for abnormal heart rate,
shortness of breath.

EXAM:
CHEST - 2 VIEW

[chest pa]
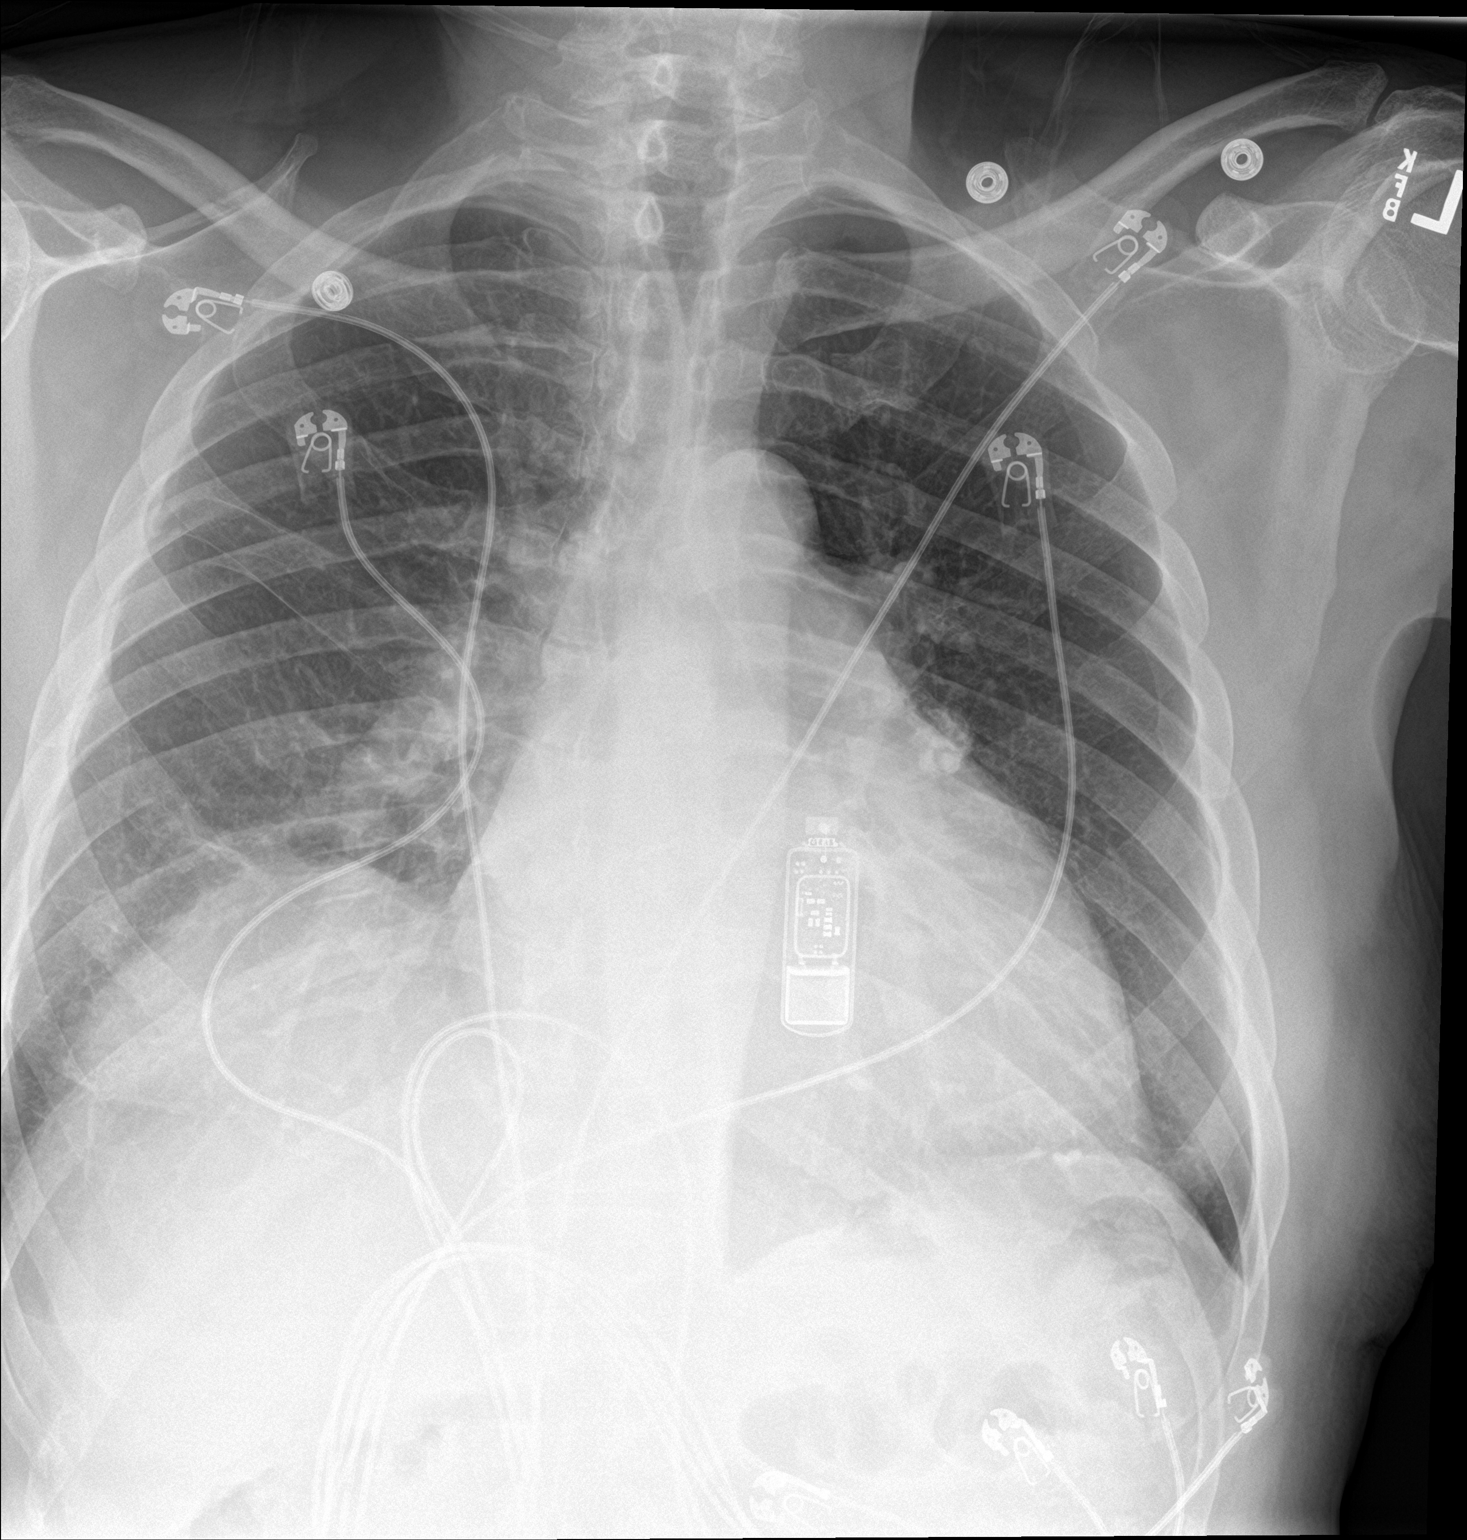

[chest lat]
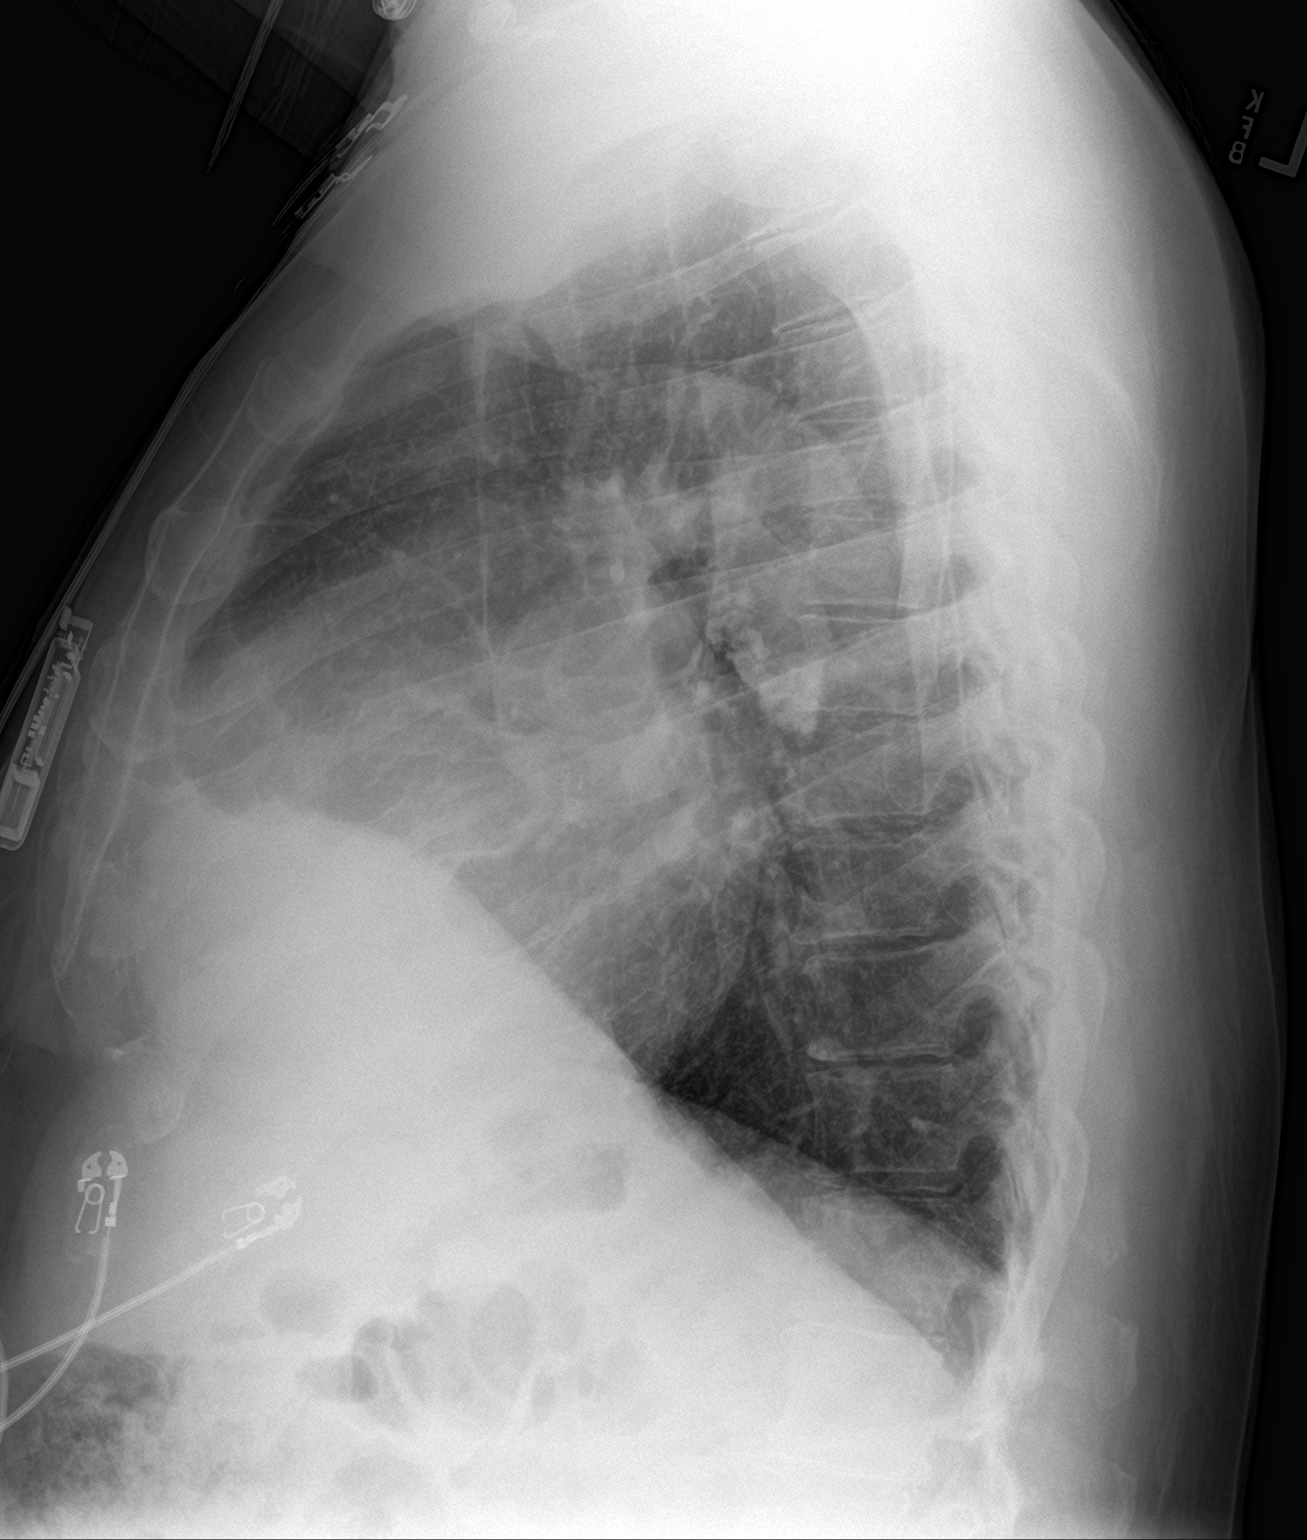

[2 of 2 positions shown; findings below may reference images not displayed]

FINDINGS: Loop recorder overlies the left chest. Moderate cardiomegaly is
unchanged. Mediastinal silhouette normal.

Elevation of the right hemidiaphragm. Associated right basilar
streaky opacities most likely reflect atelectasis. No focal
infiltrates. No edema or effusion. No pneumothorax.

No acute osseous finding.
IMPRESSION: 1. Elevation of the right hemidiaphragm with associated right
basilar atelectasis.
2. Stable cardiomegaly without pulmonary edema.

## 2019-04-29 ENCOUNTER — Other Ambulatory Visit (HOSPITAL_COMMUNITY): Payer: Self-pay

## 2019-04-29 MED ORDER — ENTRESTO 97-103 MG PO TABS: 1.0000 | ORAL_TABLET | Freq: Two times a day (BID) | ORAL | 6 refills | Status: DC

## 2019-06-13 ENCOUNTER — Ambulatory Visit: Payer: 59 | Attending: Internal Medicine

## 2019-06-13 DIAGNOSIS — Z23 Encounter for immunization: Secondary | ICD-10-CM

## 2019-06-13 NOTE — Progress Notes (Signed)
   Covid-19 Vaccination Clinic  Name:  [name]    MRN: [id number] DOB: 1950-11-03  06/13/2019  [name] was observed post Covid-19 immunization for 15 minutes without incident. He was provided with Vaccine Information Sheet and instruction to access the V-Safe system.   [name] was instructed to call 911 with any severe reactions post vaccine: Marland Kitchen Difficulty breathing  . Swelling of face and throat  . A fast heartbeat  . A bad rash all over body  . Dizziness and weakness   Immunizations Administered    Name Date Dose VIS Date Route   Pfizer COVID-19 Vaccine 06/13/2019 11:03 AM 0.3 mL 03/01/2019 Intramuscular   Manufacturer: [employer], [employer]   Lot: [id number]   NDC: [address]-4

## 2019-07-08 ENCOUNTER — Ambulatory Visit: Payer: 59 | Attending: Internal Medicine

## 2019-07-08 DIAGNOSIS — Z23 Encounter for immunization: Secondary | ICD-10-CM

## 2019-07-08 NOTE — Progress Notes (Signed)
   Covid-19 Vaccination Clinic  Name:  [name]    MRN: [id number] DOB: [birth year]  07/08/2019  Mr. [name] was observed post Covid-19 immunization for 15 minutes without incident. He was provided with Vaccine Information Sheet and instruction to access the V-Safe system.   Mr. [name] was instructed to call 911 with any severe reactions post vaccine: Marland Kitchen Difficulty breathing  . Swelling of face and throat  . A fast heartbeat  . A bad rash all over body  . Dizziness and weakness   Immunizations Administered    Name Date Dose VIS Date Route   Pfizer COVID-19 Vaccine 07/08/2019 10:29 AM 0.3 mL 05/15/2018 Intramuscular   Manufacturer: [employer], [employer]   Lot: [id number]   NDC: [address]-3

## 2019-08-28 ENCOUNTER — Telehealth: Payer: Self-pay

## 2019-08-28 ENCOUNTER — Telehealth (HOSPITAL_COMMUNITY): Payer: Self-pay | Admitting: Pharmacy Technician

## 2019-08-28 ENCOUNTER — Other Ambulatory Visit: Payer: Self-pay

## 2019-08-28 NOTE — Telephone Encounter (Signed)
Advanced Heart Failure Patient Advocate Encounter  Prior Authorization for Xarelto has been approved.    Case ID: [id number] Effective dates: 07/29/19 through 08/27/2020  Patients co-pay is $75.00  Archer Asa, CPhT

## 2019-08-28 NOTE — Telephone Encounter (Signed)
**Note De-Identified [name] Obfuscation** I received the following message from covermymeds as follows: There was an error with your request  ?  Patient not found   I called [facility] pharmacy and s/w Kathlene November who states that the pt has been getting his refills of Xarelto regularly but that this refill is requiring a PA. Kathlene November provided the information from the pts ins card I need to attempt another PA. [employer] ID: [id number] BIN: [id number]  I attempted another PA and received the same message: There was an error with your request  ?  Patient not found

## 2019-08-28 NOTE — Telephone Encounter (Signed)
**Note De-Identified Chaylee Ehrsam Obfuscation** I started a Xarelto PA through covermymeds. Key: [id number]

## 2019-12-13 ENCOUNTER — Other Ambulatory Visit (HOSPITAL_COMMUNITY): Payer: Self-pay | Admitting: Internal Medicine

## 2020-03-11 ENCOUNTER — Other Ambulatory Visit (HOSPITAL_COMMUNITY): Payer: Self-pay | Admitting: Internal Medicine

## 2020-05-05 ENCOUNTER — Other Ambulatory Visit (HOSPITAL_COMMUNITY): Payer: Self-pay | Admitting: Internal Medicine

## 2021-01-04 ENCOUNTER — Telehealth (HOSPITAL_COMMUNITY): Payer: Self-pay | Admitting: Internal Medicine

## 2021-01-04 ENCOUNTER — Other Ambulatory Visit (HOSPITAL_COMMUNITY): Payer: Self-pay | Admitting: *Deleted

## 2021-01-04 MED ORDER — FUROSEMIDE 80 MG PO TABS: ORAL_TABLET | ORAL | 0 refills | Status: DC

## 2021-01-04 NOTE — Telephone Encounter (Signed)
Pt request lasix refill , please send script to [facility]. Thanks

## 2021-01-05 ENCOUNTER — Other Ambulatory Visit (HOSPITAL_COMMUNITY): Payer: Self-pay

## 2021-01-05 MED ORDER — ENTRESTO 97-103 MG PO TABS: 1.0000 | ORAL_TABLET | Freq: Two times a day (BID) | ORAL | 0 refills | Status: DC

## 2021-02-06 ENCOUNTER — Other Ambulatory Visit (HOSPITAL_COMMUNITY): Payer: Self-pay | Admitting: Internal Medicine

## 2021-03-26 ENCOUNTER — Encounter (HOSPITAL_COMMUNITY): Payer: 59 | Admitting: Internal Medicine

## 2021-03-26 ENCOUNTER — Encounter (HOSPITAL_COMMUNITY): Payer: Self-pay

## 2021-04-08 ENCOUNTER — Other Ambulatory Visit (HOSPITAL_COMMUNITY): Payer: Self-pay | Admitting: Internal Medicine

## 2021-05-10 ENCOUNTER — Other Ambulatory Visit (HOSPITAL_COMMUNITY): Payer: Self-pay | Admitting: Internal Medicine

## 2021-06-12 ENCOUNTER — Other Ambulatory Visit (HOSPITAL_COMMUNITY): Payer: Self-pay | Admitting: Internal Medicine

## 2021-06-18 NOTE — Telephone Encounter (Signed)
Multiple message left at the pharmacy/med refills ?Last OV 2020 ?Detailed message left for patient-medication will not be refilled until appt made ?Letter mailed  ?

## 2022-02-22 ENCOUNTER — Other Ambulatory Visit (HOSPITAL_COMMUNITY): Payer: Self-pay | Admitting: Internal Medicine

## 2022-03-24 ENCOUNTER — Other Ambulatory Visit: Payer: Self-pay

## 2022-03-24 ENCOUNTER — Ambulatory Visit (HOSPITAL_BASED_OUTPATIENT_CLINIC_OR_DEPARTMENT_OTHER)
Admission: RE | Admit: 2022-03-24 | Discharge: 2022-03-24 | Disposition: A | Discharge status: Home / Self Care | Payer: 59 | Source: Ambulatory Visit | Attending: Family Medicine | Admitting: Family Medicine

## 2022-03-24 ENCOUNTER — Emergency Department (HOSPITAL_COMMUNITY): Payer: 59

## 2022-03-24 ENCOUNTER — Inpatient Hospital Stay (HOSPITAL_COMMUNITY)
Admission: EM | Admit: 2022-03-24 | Discharge: 2022-03-29 | DRG: 291 | Disposition: A | Discharge status: Home / Self Care | Payer: 59 | Attending: Cardiology | Admitting: Cardiology

## 2022-03-24 ENCOUNTER — Encounter (HOSPITAL_COMMUNITY): Payer: Self-pay | Admitting: Emergency Medicine

## 2022-03-24 ENCOUNTER — Encounter (HOSPITAL_COMMUNITY): Payer: Self-pay | Admitting: Nurse Practitioner

## 2022-03-24 ENCOUNTER — Encounter (HOSPITAL_COMMUNITY): Payer: Self-pay

## 2022-03-24 VITALS — BP 112/80 | HR 138 | Wt 273.0 lb

## 2022-03-24 DIAGNOSIS — I5023 Acute on chronic systolic (congestive) heart failure: Secondary | ICD-10-CM | POA: Insufficient documentation

## 2022-03-24 DIAGNOSIS — E1122 Type 2 diabetes mellitus with diabetic chronic kidney disease: Secondary | ICD-10-CM | POA: Diagnosis present

## 2022-03-24 DIAGNOSIS — I340 Nonrheumatic mitral (valve) insufficiency: Secondary | ICD-10-CM | POA: Insufficient documentation

## 2022-03-24 DIAGNOSIS — L299 Pruritus, unspecified: Secondary | ICD-10-CM | POA: Diagnosis not present

## 2022-03-24 DIAGNOSIS — Z8249 Family history of ischemic heart disease and other diseases of the circulatory system: Secondary | ICD-10-CM

## 2022-03-24 DIAGNOSIS — N289 Disorder of kidney and ureter, unspecified: Secondary | ICD-10-CM | POA: Diagnosis not present

## 2022-03-24 DIAGNOSIS — I4892 Unspecified atrial flutter: Secondary | ICD-10-CM | POA: Insufficient documentation

## 2022-03-24 DIAGNOSIS — Z79899 Other long term (current) drug therapy: Secondary | ICD-10-CM | POA: Insufficient documentation

## 2022-03-24 DIAGNOSIS — I083 Combined rheumatic disorders of mitral, aortic and tricuspid valves: Secondary | ICD-10-CM | POA: Diagnosis present

## 2022-03-24 DIAGNOSIS — I428 Other cardiomyopathies: Secondary | ICD-10-CM | POA: Diagnosis present

## 2022-03-24 DIAGNOSIS — Z7901 Long term (current) use of anticoagulants: Secondary | ICD-10-CM | POA: Diagnosis not present

## 2022-03-24 DIAGNOSIS — R0683 Snoring: Secondary | ICD-10-CM | POA: Insufficient documentation

## 2022-03-24 DIAGNOSIS — I472 Ventricular tachycardia, unspecified: Secondary | ICD-10-CM | POA: Insufficient documentation

## 2022-03-24 DIAGNOSIS — I484 Atypical atrial flutter: Secondary | ICD-10-CM

## 2022-03-24 DIAGNOSIS — T462X5A Adverse effect of other antidysrhythmic drugs, initial encounter: Secondary | ICD-10-CM | POA: Diagnosis not present

## 2022-03-24 DIAGNOSIS — I959 Hypotension, unspecified: Secondary | ICD-10-CM | POA: Diagnosis not present

## 2022-03-24 DIAGNOSIS — I5043 Acute on chronic combined systolic (congestive) and diastolic (congestive) heart failure: Secondary | ICD-10-CM | POA: Diagnosis not present

## 2022-03-24 DIAGNOSIS — I4891 Unspecified atrial fibrillation: Secondary | ICD-10-CM | POA: Diagnosis not present

## 2022-03-24 DIAGNOSIS — N183 Chronic kidney disease, stage 3 unspecified: Secondary | ICD-10-CM | POA: Insufficient documentation

## 2022-03-24 DIAGNOSIS — I493 Ventricular premature depolarization: Secondary | ICD-10-CM | POA: Insufficient documentation

## 2022-03-24 DIAGNOSIS — I130 Hypertensive heart and chronic kidney disease with heart failure and stage 1 through stage 4 chronic kidney disease, or unspecified chronic kidney disease: Secondary | ICD-10-CM | POA: Insufficient documentation

## 2022-03-24 DIAGNOSIS — T45516A Underdosing of anticoagulants, initial encounter: Secondary | ICD-10-CM | POA: Diagnosis present

## 2022-03-24 DIAGNOSIS — Z888 Allergy status to other drugs, medicaments and biological substances status: Secondary | ICD-10-CM

## 2022-03-24 DIAGNOSIS — I4819 Other persistent atrial fibrillation: Secondary | ICD-10-CM | POA: Diagnosis present

## 2022-03-24 DIAGNOSIS — I110 Hypertensive heart disease with heart failure: Secondary | ICD-10-CM | POA: Diagnosis not present

## 2022-03-24 DIAGNOSIS — I483 Typical atrial flutter: Principal | ICD-10-CM | POA: Diagnosis present

## 2022-03-24 DIAGNOSIS — I429 Cardiomyopathy, unspecified: Secondary | ICD-10-CM | POA: Insufficient documentation

## 2022-03-24 DIAGNOSIS — I509 Heart failure, unspecified: Secondary | ICD-10-CM | POA: Diagnosis not present

## 2022-03-24 DIAGNOSIS — I10 Essential (primary) hypertension: Secondary | ICD-10-CM

## 2022-03-24 DIAGNOSIS — I361 Nonrheumatic tricuspid (valve) insufficiency: Secondary | ICD-10-CM | POA: Diagnosis not present

## 2022-03-24 HISTORY — DX: Chronic systolic (congestive) heart failure: I50.22

## 2022-03-24 HISTORY — DX: Other cardiomyopathies: I42.8

## 2022-03-24 LAB — COMPREHENSIVE METABOLIC PANEL
ALT: 20 U/L (ref 0–44)
AST: 25 U/L (ref 15–41)
Albumin: 3.2 g/dL — ABNORMAL LOW (ref 3.5–5.0)
Alkaline Phosphatase: 60 U/L (ref 38–126)
Anion gap: 8 (ref 5–15)
BUN: 11 mg/dL (ref 8–23)
CO2: 28 mmol/L (ref 22–32)
Calcium: 9.1 mg/dL (ref 8.9–10.3)
Chloride: 102 mmol/L (ref 98–111)
Creatinine, Ser: 1.21 mg/dL (ref 0.61–1.24)
GFR, Estimated: >60 mL/min (ref >60)
Glucose, Bld: 104 mg/dL — ABNORMAL HIGH (ref 70–99)
Potassium: 4.2 mmol/L (ref 3.5–5.1)
Sodium: 138 mmol/L (ref 135–145)
Total Bilirubin: 0.7 mg/dL (ref 0.3–1.2)
Total Protein: 6.9 g/dL (ref 6.5–8.1)

## 2022-03-24 LAB — CBC
HCT: 39.7 % (ref 39.0–52.0)
Hemoglobin: 13.4 g/dL (ref 13.0–17.0)
MCH: 33.8 pg (ref 26.0–34.0)
MCHC: 33.8 g/dL (ref 30.0–36.0)
MCV: 100 fL (ref 80.0–100.0)
Platelets: 129 10*3/uL — ABNORMAL LOW (ref 150–400)
RBC: 3.97 MIL/uL — ABNORMAL LOW (ref 4.22–5.81)
RDW: 13.5 % (ref 11.5–15.5)
WBC: 4.1 10*3/uL (ref 4.0–10.5)
nRBC: 0 % (ref 0.0–0.2)

## 2022-03-24 LAB — LACTIC ACID, PLASMA: Lactic Acid, Venous: 1.1 mmol/L (ref 0.5–1.9)

## 2022-03-24 LAB — BRAIN NATRIURETIC PEPTIDE: B Natriuretic Peptide: 287.5 pg/mL — ABNORMAL HIGH (ref 0.0–100.0)

## 2022-03-24 LAB — TSH: TSH: 0.669 u[IU]/mL (ref 0.350–4.500)

## 2022-03-24 LAB — HIV ANTIBODY (ROUTINE TESTING W REFLEX): HIV Screen 4th Generation wRfx: NONREACTIVE

## 2022-03-24 LAB — TROPONIN I (HIGH SENSITIVITY): Troponin I (High Sensitivity): 15 ng/L (ref <18)

## 2022-03-24 LAB — MAGNESIUM: Magnesium: 2.1 mg/dL (ref 1.7–2.4)

## 2022-03-24 MED ORDER — METOPROLOL TARTRATE 25 MG PO TABS
25.0000 mg | ORAL_TABLET | Freq: Four times a day (QID) | ORAL | Status: DC
  Administered 2022-03-24 – 2022-03-25 (×2): 25 mg via ORAL
  Filled 2022-03-24 (×2): qty 1

## 2022-03-24 MED ORDER — SODIUM CHLORIDE 0.9 % IV SOLN
250.0000 mL | INTRAVENOUS | Status: DC | PRN
  Administered 2022-03-25: 250 mL via INTRAVENOUS

## 2022-03-24 MED ORDER — SODIUM CHLORIDE 0.9 % IV SOLN: INTRAVENOUS | Status: DC

## 2022-03-24 MED ORDER — ONDANSETRON HCL 4 MG/2ML IJ SOLN: 4.0000 mg | Freq: Four times a day (QID) | INTRAMUSCULAR | Status: DC | PRN

## 2022-03-24 MED ORDER — ACETAMINOPHEN 325 MG PO TABS: 650.0000 mg | ORAL_TABLET | ORAL | Status: DC | PRN

## 2022-03-24 MED ORDER — FUROSEMIDE 10 MG/ML IJ SOLN
80.0000 mg | Freq: Once | INTRAMUSCULAR | Status: AC
  Administered 2022-03-24: 80 mg via INTRAVENOUS
  Filled 2022-03-24: qty 8

## 2022-03-24 MED ORDER — SPIRONOLACTONE 25 MG PO TABS: 25.0000 mg | ORAL_TABLET | Freq: Every day | ORAL | Status: DC

## 2022-03-24 MED ORDER — SODIUM CHLORIDE 0.9% FLUSH: 3.0000 mL | INTRAVENOUS | Status: DC | PRN

## 2022-03-24 MED ORDER — HEPARIN BOLUS VIA INFUSION
4000.0000 [IU] | Freq: Once | INTRAVENOUS | Status: AC
  Administered 2022-03-24: 4000 [IU] via INTRAVENOUS
  Filled 2022-03-24: qty 4000

## 2022-03-24 MED ORDER — HEPARIN (PORCINE) 25000 UT/250ML-% IV SOLN
1600.0000 [IU]/h | INTRAVENOUS | Status: DC
  Administered 2022-03-24: 1300 [IU]/h via INTRAVENOUS
  Administered 2022-03-25 – 2022-03-28 (×5): 1600 [IU]/h via INTRAVENOUS
  Filled 2022-03-24 (×5): qty 250

## 2022-03-24 MED ORDER — SODIUM CHLORIDE 0.9% FLUSH
3.0000 mL | Freq: Two times a day (BID) | INTRAVENOUS | Status: DC
  Administered 2022-03-24 – 2022-03-29 (×7): 3 mL via INTRAVENOUS

## 2022-03-24 MED ORDER — FUROSEMIDE 10 MG/ML IJ SOLN: 80.0000 mg | Freq: Two times a day (BID) | INTRAMUSCULAR | Status: DC

## 2022-03-24 MED ORDER — SACUBITRIL-VALSARTAN 97-103 MG PO TABS
1.0000 | ORAL_TABLET | Freq: Two times a day (BID) | ORAL | Status: DC
  Administered 2022-03-25: 1 via ORAL
  Filled 2022-03-24 (×3): qty 1

## 2022-03-24 NOTE — Progress Notes (Signed)
Advanced Heart Failure Clinic Note   PCP: Lucianne Lei, MD HF Cardiologist: Dr Haroldine Laws EP: Curt Bears  HPI: [name] is a 72 y.o. male with a history of persistent afib (s/p ablation 2012, DCCV 09/12/17), HTN, and chronic systolic HF.   Underwent DC-CV of AF on 09/12/17.   Admitted 6/27-09/18/17 from afib clinic with volume overload and AKI. HF team consulted. Echo showed EF 15-20%. Diuresed 29 lbs with IV lasix. Transitioned to lasix 80/40. HF medications optimized as able. AKI resolved with diuresis. DC weight: 263 lbs.   Last seen 05/2018, volume up in setting of AFL with RVR. Taken to ED for diuresis and DCCV, converted to NSR. Unfortunately, lost to follow up.  Today he returns for HF follow up. We have not seen him since 2020. He can walk on flat ground OK, but gets SOB walking longer distances. Has noticed legs are heavier, does not weigh at home. 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 Denies palpitations, CP, dizziness, or PND/Orthopnea. Appetite ok. No fever or chills. No longer on Xarelto and spiro, says he ran out about a year and a half ago.   Cardiac Studies: -  14-day monitor 12/19  1. Predominant underlying rhythm was Sinus Rhythm. Average HR 74 bpm 2. One run of NSVT lasting 4 beats 3. Atrial Fibrillation occurred (2% burden), ranging from 68-195 bpm (avg of 116 bpm), the longest lasting 4 hours 34 mins with an avg rate of 115 bpm.  4. Rare PVCs  - Echo 10/19 EF 45-50%, diffuse HK, mild MR, LA severely dilated, RA mildly dilated, RV normal  - TEE 09/12/17: EF 15-20%, diffuse HK, mild to mod MR, RV systolic function reduced, RA and LA dilated  - LHC 06/12/2010 1. Normal coronaries, mild global LV dysfunction, most likely AFib mediated. 2. No mitral regurgitation.  Review of systems complete and found to be negative unless listed in HPI.   Past Medical History:  Diagnosis Date   Atrial tachycardia ([place])    Cardiomyopathy ([place])    Hypertension    Mitral  regurgitation    Paroxysmal atrial flutter (HCC)    Persistent atrial fibrillation (HCC)     Current Outpatient Medications  Medication Sig Dispense Refill   acetaminophen (TYLENOL) 325 MG tablet Take 2 tablets (650 mg total) by mouth every 4 (four) hours as needed for headache or mild pain.     furosemide (LASIX) 80 MG tablet TAKE 1 TABLET(80 MG) BY MOUTH DAILY. MAY TAKE AN ADDITIONAL 40 MG AS NEEDED 60 tablet 0   sacubitril-valsartan (ENTRESTO) 97-103 MG TAKE 1 TABLET BY MOUTH TWICE DAILY 60 tablet 0   metoprolol succinate (TOPROL-XL) 100 MG 24 hr tablet TAKE 1 TABLET BY MOUTH AT BEDTIME, TAKE WITH OR IMMEDIATELY FOLLOWING A MEAL 90 tablet 3   sildenafil (VIAGRA) 25 MG tablet Take 1 tablet (25 mg total) by mouth daily as needed for erectile dysfunction. 10 tablet 0   spironolactone (ALDACTONE) 25 MG tablet TAKE 1 TABLET(25 MG) BY MOUTH DAILY 90 tablet 3   XARELTO 20 MG TABS tablet TAKE 1 TABLET(20 MG) BY MOUTH DAILY WITH SUPPER 90 tablet 3   No current facility-administered medications for this encounter.    Allergies  Allergen Reactions   Amiodarone Itching and Rash      Social History   Socioeconomic History   Marital status: Widowed    Spouse name: Not on file   Number of children: Not on file   Years of education: Not on file  Highest education level: Not on file  Occupational History   Not on file  Tobacco Use   Smoking status: Never   Smokeless tobacco: Never  Vaping Use   Vaping Use: Never used  Substance and Sexual Activity   Alcohol use: Yes    Comment: rare   Drug use: Never   Sexual activity: Not on file  Other Topics Concern   Not on file  Social History Narrative   Not on file   Social Determinants of Health   Financial Resource Strain: Not on file  Food Insecurity: Not on file  Transportation Needs: Not on file  Physical Activity: Not on file  Stress: Not on file  Social Connections: Not on file  Intimate Partner Violence: Not on file   BP  112/80   Pulse (!) 138   Wt 123.8 kg (273 lb)   SpO2 98%   BMI 33.23 kg/m   Wt Readings from Last 3 Encounters:  03/24/22 123.8 kg (273 lb)  05/24/18 130.6 kg (288 lb)  05/24/18 128.2 kg (282 lb 9.6 oz)   PHYSICAL EXAM: General:  NAD. No resp difficulty, walked into clinic HEENT: Normal Neck: Supple. JVP to ear. Carotids 2+ bilat; no bruits. No lymphadenopathy or thryomegaly appreciated. Cor: PMI nondisplaced. Tachy regular rate & rhythm. No rubs, gallops or murmurs. Lungs: Crackles LLL Abdomen: Soft, nontender, nondistended. No hepatosplenomegaly. No bruits or masses. Good bowel sounds. Extremities: No cyanosis, clubbing, rash, 2+ BLE edema to thighs Neuro: Alert & oriented x 3, cranial nerves grossly intact. Moves all 4 extremities w/o difficulty. Affect pleasant.  ECG (personally reviewed): AFL 138 bpm  ReDs: 52%  ASSESSMENT & PLAN: 1. Acute on Chronic systolic HF.  - EF 42% as far back as 2009.  - [place] 2012: normal coronaries. No ICD.  - Echo 09/12/17: EF 15-20%, diffuse HK, mild to mod MR - Echo 10/19  EF 45-50%, diffuse HK, mild MR, LA severely dilated, RA mildly dilated, RV normal - NYHA II-III symptoms, though I suspect he downplays his symptoms. He is volume overloaded on exam, REDs 53%, likely in setting of AFL with RVR.  - Discussed with Dr. Daniel Nones, needs admission for IV diuresis, and +/-TEE/DCCV.  - BP OK. - Would give Lasix 80 mg IV  - Continue Entresto 97/103 bid. - Continue Toprol XL 100 mg daily. - Add spiro back as BP allows. - Check CMET, CBC, BNP, A1C, TSH, Mag, HIV, lactic acid. - Will need CXR and echo. - Place Unna boots  2. Paroxysmal Afib  - s/p ablation in 2012  - DCCV 09/12/17 - Follows with Dr. Curt Bears, he is allergic to amiodarone. - 14 day monitor (01/2018) only 2% AF with rare PVCs. - He has recurrent AFL today with RVR, unknown how long he has been back in AFL. - BP OK today, Continue Toprol for now. - He is not taking Xarelto, x 1.5  years. - As above, needs admission. Needs AC, with possible TEE/DCCV  3. HTN - BP OK. - Meds as above.  4. CKD 3 - Baseline SCr 1.3-1.8.  - Labs today.  5. Snoring - Needs sleep study down the road.  Discussed with Dr. Daniel Nones, unknown how long patient has been in AFL with RVR. He is off AC and volume overloaded. He needs admission for IV diuresis, and rhythm control.   Gans, FNP 03/24/22

## 2022-03-24 NOTE — Progress Notes (Signed)
ReDS Vest / Clip - 03/24/22 1200       ReDS Vest / Clip   Station Marker D    Ruler Value 37    ReDS Value Range High volume overload    ReDS Actual Value 52

## 2022-03-24 NOTE — Progress Notes (Signed)
ANTICOAGULATION CONSULT NOTE - Initial Consult  Pharmacy Consult for heparin  Indication: atrial fibrillation  Allergies  Allergen Reactions   Pacerone [Amiodarone] Itching and Rash    Patient Measurements:   Heparin Dosing Weight: 113.1kg   Vital Signs: Temp: 97.9 F (36.6 C) (01/04 1745) Temp Source: Oral (01/04 1745) BP: 121/92 (01/04 1900) Pulse Rate: 132 (01/04 1900)  Labs: Recent Labs    03/24/22 1144 03/24/22 1512  HGB 13.4  --   HCT 39.7  --   PLT 129*  --   CREATININE 1.21  --   TROPONINIHS  --  15    CrCl cannot be calculated (Unknown ideal weight.).   Medical History: Past Medical History:  Diagnosis Date   Atrial tachycardia    Chronic HFrEF (heart failure with reduced ejection fraction) (HCC)    Hypertension    Mitral regurgitation    NICM (nonischemic cardiomyopathy) ([place])    a. 2009 EF 25%; b. 05/2010 Cath: nl cors. EF 45%; c. 12/2017 Echo: EF 45-50%, diff HK, mild MR, sev dil LA.   Paroxysmal atrial flutter ([place])    a. Dx in 05/2018.   Persistent atrial fibrillation ([place])    a. 2012 s/p RFCA; b. 08/2017 s/p DCCV.    Assessment: Patient admitted for afib and fluid overload. Hx of Afib previously on Xarelto but has been off for sometime. Unknown why was stopped. Cardiology consulted, heparin to be used until TEE/DCCV in AM.    Goal of Therapy:  Heparin level 0.3-0.7 units/ml Monitor platelets by anticoagulation protocol: Yes   Plan:  Give 4000 units bolus x 1 Start heparin infusion at 1300 units/hr Check anti-Xa level in 8 hours and daily while on heparin Continue to monitor H&H and platelets  Esmeralda Arthur, PharmD, BCCCP  03/24/2022,8:33 PM

## 2022-03-24 NOTE — H&P (Addendum)
History & Physical    Patient ID: [name] MRN: [id number], DOB/AGE: [id number]   Admit date: 03/24/2022   Primary Physician: Renaye Rakers, MD Primary Cardiologist: Arvilla Meres, MD  Patient Profile    72 y/o ? w/ a h/o HFrEF, HTN, and persistent Afib s/p RFCA in 2012 and DCCV in 08/2017, who was seen in CHF clinic earlier today and found to be in rapid atrial flutter.  Past Medical History    Past Medical History:  Diagnosis Date   Atrial tachycardia    Chronic HFrEF (heart failure with reduced ejection fraction) (HCC)    Hypertension    Mitral regurgitation    NICM (nonischemic cardiomyopathy) (HCC)    a. 2009 EF 25%; b. 05/2010 Cath: nl cors. EF 45%; c. 12/2017 Echo: EF 45-50%, diff HK, mild MR, sev dil LA.   Paroxysmal atrial flutter (HCC)    a. Dx in 05/2018.   Persistent atrial fibrillation (HCC)    a. 2012 s/p RFCA; b. 08/2017 s/p DCCV.    Past Surgical History:  Procedure Laterality Date   CARDIAC ELECTROPHYSIOLOGY STUDY AND ABLATION  2012   Dr. Riccardo Dubin   CARDIOVERSION N/A 09/12/2017   Procedure: CARDIOVERSION;  Surgeon: Jake Bathe, MD;  Location: [facility] ENDOSCOPY;  Service: Cardiovascular;  Laterality: N/A;   LOOP RECORDER IMPLANT  06/11/2010   TEE WITHOUT CARDIOVERSION N/A 09/12/2017   Procedure: TRANSESOPHAGEAL ECHOCARDIOGRAM (TEE);  Surgeon: Jake Bathe, MD;  Location: [facility] ENDOSCOPY;  Service: Cardiovascular;  Laterality: N/A;     Allergies  Allergies  Allergen Reactions   Pacerone [Amiodarone] Itching and Rash    History of Present Illness    72 y/o ? w/ a h/o HFrEF, HTN, and persistent Afib s/p RFCA in 2012.  EF has been low dating back to 2009 w/ EF of 25%. Cath in 2012 showed nl cors.  Most recent echo in 12/2017 showed an EF of 45-50% w/ diff HK, mild MR, sev dil LA.  In 08/2017, he required DCCV for afib.  Two days later, he was seen in afib clinic and noted to be volume overloaded.  Labs revealed AKI, and he was admitted for diuresis.   In 05/2018, he was seen in CHF clinic and was again volume overloaded and found to be in atrial flutter w/ RVR.  He was taken to the ED, but converted to sinus rhythm in the ED and was then d/c'd home.  He was subsequently lost to f/u following the onset of the COVID pandemic.    Over the past three years, he came off of his xarelto and spiro, but has been taking entresto and lasix.  Over the past 3-4 months, he began to experience increasing wt gain and lower ext edema.  Around the same time, he noted elevation in HR for the first time in a long time.  He was taking a testosterone supplement and he thought that he was just gaining more muscle mass.  Regardless, in the setting of wt gain and swelling, he noted increasing DOE and so he stopped the testosterone.  He cont to have lower ext edema and wt gain, but otw felt reasonably well as long as he didn't push himself too hard.  He recently decided to reestablish care w/ CHF clinic and was seen today.  He was noted to be tachycardic and in rapid atrial flutter.  In addition, he was noted to be volume overloaded w/ JVD, lower ext edema, and LLL crackles.  ReDS vest reading  elevated @ 52.  Decision was made to send to the ED for admission, diuresis, anticoagulation, and eventual TEE/DCCV.  Currently he has no complaints @ rest. HRs remain pinned @ 138 bpm.  Home Medications    Prior to Admission medications   Medication Sig Start Date End Date Taking? Authorizing Provider  acetaminophen (TYLENOL) 325 MG tablet Take 2 tablets (650 mg total) by mouth every 4 (four) hours as needed for headache or mild pain. 09/18/17   Shirley Friar, PA-C  furosemide (LASIX) 80 MG tablet TAKE 1 TABLET(80 MG) BY MOUTH DAILY. MAY TAKE AN ADDITIONAL 40 MG AS NEEDED 02/22/22   Bensimhon, Shaune Pascal, MD  metoprolol succinate (TOPROL-XL) 100 MG 24 hr tablet TAKE 1 TABLET BY MOUTH AT BEDTIME, TAKE WITH OR IMMEDIATELY FOLLOWING A MEAL 10/31/18   Bensimhon, Shaune Pascal, MD   sacubitril-valsartan (ENTRESTO) 97-103 MG TAKE 1 TABLET BY MOUTH TWICE DAILY 02/22/22   Bensimhon, Shaune Pascal, MD  sildenafil (VIAGRA) 25 MG tablet Take 1 tablet (25 mg total) by mouth daily as needed for erectile dysfunction. 01/02/18   Bensimhon, Shaune Pascal, MD  spironolactone (ALDACTONE) 25 MG tablet TAKE 1 TABLET(25 MG) BY MOUTH DAILY 10/30/18   Bensimhon, Shaune Pascal, MD  XARELTO 20 MG TABS tablet TAKE 1 TABLET(20 MG) BY MOUTH DAILY WITH SUPPER 10/29/18   Bensimhon, Shaune Pascal, MD    Family History    No premature CAD.    Social History    Social History   Socioeconomic History   Marital status: Widowed    Spouse name: Not on file   Number of children: Not on file   Years of education: Not on file   Highest education level: Not on file  Occupational History   Not on file  Tobacco Use   Smoking status: Never   Smokeless tobacco: Never  Vaping Use   Vaping Use: Never used  Substance and Sexual Activity   Alcohol use: Yes    Comment: rare   Drug use: Never   Sexual activity: Not on file  Other Topics Concern   Not on file  Social History Narrative   Not on file   Social Determinants of Health   Financial Resource Strain: Not on file  Food Insecurity: Not on file  Transportation Needs: Not on file  Physical Activity: Not on file  Stress: Not on file  Social Connections: Not on file  Intimate Partner Violence: Not on file     Review of Systems    General:  No chills, fever, night sweats or weight changes.  Cardiovascular:  No chest pain, +++ dyspnea on exertion w/ longer periods of walking, +++ bilat LE  edema, no orthopnea, palpitations, paroxysmal nocturnal dyspnea. Dermatological: No rash, lesions/masses Respiratory: No cough, +++ dyspnea w/ longer periods of walking. Urologic: No hematuria, dysuria Abdominal:   No nausea, vomiting, diarrhea, bright red blood per rectum, melena, or hematemesis Neurologic:  No visual changes, wkns, changes in mental status. All other  systems reviewed and are otherwise negative except as noted above.  Physical Exam    Blood pressure (!) 121/92, pulse (!) 132, temperature 97.9 F (36.6 C), temperature source Oral, resp. rate 15, SpO2 99 %.  General: Pleasant, NAD Psych: Normal affect. Neuro: Alert and oriented X 3. Moves all extremities spontaneously. HEENT: Normal  Neck: Supple without bruits.  JVP to earlobe. Lungs:  Resp regular and unlabored, bibasilar crackles. Heart: RRR, tachy, no s3, s4, or murmurs. Abdomen: Soft, non-tender, non-distended, BS + x  4.  Extremities: No clubbing, cyanosis or 2+ bilat LE edema to thighs. DP/PT 2+, Radials 2+ and equal bilaterally.  Labs    Cardiac Enzymes Recent Labs  Lab 03/24/22 1512  TROPONINIHS 15      Lab Results  Component Value Date   WBC 4.1 03/24/2022   HGB 13.4 03/24/2022   HCT 39.7 03/24/2022   MCV 100.0 03/24/2022   PLT 129 (L) 03/24/2022    Recent Labs  Lab 03/24/22 1144  NA 138  K 4.2  CL 102  CO2 28  BUN 11  CREATININE 1.21  CALCIUM 9.1  PROT 6.9  BILITOT 0.7  ALKPHOS 60  ALT 20  AST 25  GLUCOSE 104*   BNP    Component Value Date/Time   BNP 287.5 (H) 03/24/2022 1144    ProBNP    Component Value Date/Time   PROBNP 726.0 (H) 09/23/2008 0406     Radiology Studies    DG Chest Port 1 View  Result Date: 03/24/2022 CLINICAL DATA:  Weak EXAM: PORTABLE CHEST 1 VIEW COMPARISON:  05/24/2018 FINDINGS: Cardiomegaly. Unchanged right basilar scarring or atelectasis. Left lung is normally aerated. Osseous structures unremarkable. IMPRESSION: Cardiomegaly. Unchanged right basilar scarring or atelectasis. No acute appearing airspace opacity. Electronically Signed   By: Jearld Lesch M.D.   On: 03/24/2022 18:12    ECG & Cardiac Imaging    Atrial flutter, 138, lateral ST depression and TWI - personally reviewed.  Assessment & Plan    1.  Acute on chronic HFrEF/NICM:  Pt w/ h/o NICM dating back to 2009 w/ EF prev as low as 25% w/ nl cors on  cath in 2012.  Most recent echo in 12/2017 w/ EF 45-50%.  Over the past 3-4 mos, he has noted increasing DOE, LEE, and wt gain.  He thinks that his HR was elevated about 3-4 mos ago, but he really hasn't paid much attention to it.  In the setting of progressive symptoms, he re-established care w/ CHF today and was found to be in aflutter w/ RVR w/ marked volume overload and ReDS vest of 52%.  As a result, he was sent to the ED. Currently he is asymptomatic but remains tachycardic @ 138.  Plan to admit, diurese, f/u echo.  Will add oral ? blocker for rate control now and plan to consolidate to toprol xl or carvedilol prior to d/c.  Cont entresto, will drop dose to 24-26 mg BID for now as we add other GDMT.  Will add spiro w/ eye toward adding SGLT2i prior to d/c.  Advanced CHF team to see in AM.  2.  Atrial flutter w/ RVR:  H/o Afib s/p RFCA in 2012 w/ DCCV in 08/2017 and aflutter dx in 05/2018, which converted upon arrival to ED, and he was subsequently lost to f/u.  About 3-4 months ago, he noted palpitations but hasn't thought much about it since, but as outlined above, his volume status has worsened over that time.  Though he was prev on xarelto, he has been off of this for some time.  He remains in aflutter @ 138 currently and is asymp @ rest.  Will add oral ? blocker and heparin w/ plan to transition back to DOAC following DCCV (CHA2DS2VASc = 3).  Keep NPO and look to schedule TEE/DCCV in AM.  3.  Essential HTN:  stable.  Follow w/ resumption of ? blocker and spiro.  Risk Assessment/Risk Scores:        New York Heart Association (NYHA)  Functional Class NYHA Class III  CHA2DS2-VASc Score = 3   This indicates a 3.2% annual risk of stroke. The patient's score is based upon: CHF History: 1 HTN History: 1 Diabetes History: 0 Stroke History: 0 Vascular Disease History: 0 Age Score: 1 Gender Score: 0     Signed, Murray Hodgkins, NP 03/24/2022, 8:13 PM  Patient seen and examined.  Agree with  above documentation.  Mr. Fritchman is a 72 year old male with a history of chronic combined heart failure, atrial fibrillation, hypertension who presents with atrial flutter with RVR.  He was diagnosed with systolic heart failure in 2009, echocardiogram at that time showed EF 25%.  Cardiac catheterization showed normal coronary arteries.  Most recent echocardiogram in 2019 showed EF had improved to 45 to 50%.  He has not been seen by cardiology since 2020.  He presented to heart failure clinic today to reestablish care.  He reports that he has gained about 20 pounds over the last 3 months.  Currently denies any dyspnea, chest pain, or palpitations.  On presentation to heart failure clinic today, was noted to be significantly volume overloaded.  Reds vest was elevated at 52.  He was noted to be in atrial flutter with rates in the 130s.  Recommended to go to the ED for admission for diuresis and likely TEE/DCCV.  He reports he was taking Entresto and Lasix at home but was not on any other medications.  Has been off anticoagulation.  In the ED, initial vital signs notable for BP 121/95, pulse 137, SpO2 96% on room air.  Labs notable for creatinine 1.21, BNP 288, troponin 15, lactate 1.1, hemoglobin 13.4, platelets 129, WBC 4.1.  Chest x-ray showed cardiomegaly and unchanged right basilar scarring or atelectasis, no acute appearing airspace opacity.  EKG shows atrial flutter, rate 135. On exam, patient is alert and oriented, regular rhythm, tachycardic, no murmurs, scattered expiratory wheezing, trace lower extremity edema, + JVD.  For his acute on chronic combined heart failure, appears warm and wet on exam, start diuresis with IV Lasix 80 mg twice daily.  Will continue his home Entresto and add spironolactone.  For his atrial flutter with RVR, start Lopressor for rate control, plan to consolidate to Toprol-XL prior to discharge.  Atrial flutter can be difficult to rate control and suspect he will need cardioverted.   Start heparin drip for now in case able to TEE/DCCV tomorrow; if unable to add on for TEE/DCCV tomorrow, would switch to DOAC and plan for procedure on Monday.  Donato Heinz, MD

## 2022-03-24 NOTE — ED Triage Notes (Signed)
Pt sent from heart center for reports of tachycardia and fluid overload. Denies SHOB.

## 2022-03-24 NOTE — ED Provider Triage Note (Addendum)
Emergency Medicine Provider Triage Evaluation Note  [name] , a 72 y.o. male  was evaluated in triage.  Pt complains of tachycardia onset this morning at cardiologist office. History of Afib. Takes entresto for his symptoms. Associated bilateral leg swelling. Compliant with his meds. Denies palpitations, chest pain, shortness of breath.  Review of Systems  Positive:  Negative:   Physical Exam  BP (!) 121/95   Pulse (!) 137   Temp 98.1 F (36.7 C) (Oral)   Resp 20   SpO2 96%  Gen:   Awake, no distress   Resp:  Normal effort  MSK:   Moves extremities without difficulty  Other:  Trace edema noted to BLE. Tachycardia noted on exam.   Medical Decision Making  Medically screening exam initiated at 12:57 PM.  Appropriate orders placed.  [name] was informed that the remainder of the evaluation will be completed by another provider, this initial triage assessment does not replace that evaluation, and the importance of remaining in the ED until their evaluation is complete.  12:58 PM - Discussed with RN that patient is in need of a room immediately. RN aware and working on room placement.   Labs were drawn this morning at cardiologist office.    [name] A, PA-C 03/24/22 1259    [name] A, PA-C 03/24/22 1302

## 2022-03-24 NOTE — ED Provider Notes (Signed)
[facility] EMERGENCY DEPARTMENT Provider Note   CSN: [id number] Arrival date & time: 03/24/22  1156     History  Chief Complaint  Patient presents with   Tachycardia    [name] is a 72 y.o. male.  Patient was seen in the cardiology office today for swelling in his legs.  He has a history of atrial fibs flutter..  Patient complains of some weakness.  He was sent over from cardiology office to be admitted for rate control and treatment of his atrial fibs/flutter  The history is provided by the patient and medical records. No language interpreter was used.  Weakness Severity:  Moderate Onset quality:  Sudden Timing:  Constant Progression:  Worsening Chronicity:  Recurrent Context: not alcohol use   Relieved by:  Nothing Worsened by:  Nothing Ineffective treatments:  None tried Associated symptoms: no abdominal pain, no chest pain, no cough, no diarrhea, no frequency, no headaches and no seizures        Home Medications Prior to Admission medications   Medication Sig Start Date End Date Taking? Authorizing Provider  acetaminophen (TYLENOL) 325 MG tablet Take 2 tablets (650 mg total) by mouth every 4 (four) hours as needed for headache or mild pain. 09/18/17   Shirley Friar, PA-C  furosemide (LASIX) 80 MG tablet TAKE 1 TABLET(80 MG) BY MOUTH DAILY. MAY TAKE AN ADDITIONAL 40 MG AS NEEDED 02/22/22   Bensimhon, Shaune Pascal, MD  metoprolol succinate (TOPROL-XL) 100 MG 24 hr tablet TAKE 1 TABLET BY MOUTH AT BEDTIME, TAKE WITH OR IMMEDIATELY FOLLOWING A MEAL 10/31/18   Bensimhon, Shaune Pascal, MD  sacubitril-valsartan (ENTRESTO) 97-103 MG TAKE 1 TABLET BY MOUTH TWICE DAILY 02/22/22   Bensimhon, Shaune Pascal, MD  sildenafil (VIAGRA) 25 MG tablet Take 1 tablet (25 mg total) by mouth daily as needed for erectile dysfunction. 01/02/18   Bensimhon, Shaune Pascal, MD  spironolactone (ALDACTONE) 25 MG tablet TAKE 1 TABLET(25 MG) BY MOUTH DAILY 10/30/18   Bensimhon, Shaune Pascal, MD   XARELTO 20 MG TABS tablet TAKE 1 TABLET(20 MG) BY MOUTH DAILY WITH SUPPER 10/29/18   Bensimhon, Shaune Pascal, MD      Allergies    Amiodarone    Review of Systems   Review of Systems  Constitutional:  Negative for appetite change and fatigue.  HENT:  Negative for congestion, ear discharge and sinus pressure.   Eyes:  Negative for discharge.  Respiratory:  Negative for cough.   Cardiovascular:  Negative for chest pain.  Gastrointestinal:  Negative for abdominal pain and diarrhea.  Genitourinary:  Negative for frequency and hematuria.  Musculoskeletal:  Negative for back pain.  Skin:  Negative for rash.  Neurological:  Positive for weakness. Negative for seizures and headaches.  Psychiatric/Behavioral:  Negative for hallucinations.     Physical Exam Updated Vital Signs BP (!) 121/92   Pulse (!) 132   Temp 97.9 F (36.6 C) (Oral)   Resp 15   SpO2 99%  Physical Exam Vitals and nursing note reviewed.  Constitutional:      Appearance: He is well-developed.  HENT:     Head: Normocephalic.     Nose: Nose normal.  Eyes:     General: No scleral icterus.    Conjunctiva/sclera: Conjunctivae normal.  Neck:     Thyroid: No thyromegaly.  Cardiovascular:     Rate and Rhythm: Regular rhythm. Tachycardia present.     Heart sounds: No murmur heard.    No friction rub. No gallop.  Pulmonary:     Breath sounds: No stridor. No wheezing or rales.  Chest:     Chest wall: No tenderness.  Abdominal:     General: There is no distension.     Tenderness: There is no abdominal tenderness. There is no rebound.  Musculoskeletal:        General: Normal range of motion.     Cervical back: Neck supple.  Lymphadenopathy:     Cervical: No cervical adenopathy.  Skin:    Findings: No erythema or rash.  Neurological:     Mental Status: He is alert and oriented to person, place, and time.     Motor: No abnormal muscle tone.     Coordination: Coordination normal.  Psychiatric:        Behavior:  Behavior normal.     ED Results / Procedures / Treatments   Labs (all labs ordered are listed, but only abnormal results are displayed) Labs Reviewed  TROPONIN I (HIGH SENSITIVITY)    EKG None  Radiology DG Chest Port 1 View  Result Date: 03/24/2022 CLINICAL DATA:  Weak EXAM: PORTABLE CHEST 1 VIEW COMPARISON:  05/24/2018 FINDINGS: Cardiomegaly. Unchanged right basilar scarring or atelectasis. Left lung is normally aerated. Osseous structures unremarkable. IMPRESSION: Cardiomegaly. Unchanged right basilar scarring or atelectasis. No acute appearing airspace opacity. Electronically Signed   By: Delanna Ahmadi M.D.   On: 03/24/2022 18:12    Procedures Procedures    Medications Ordered in ED Medications  furosemide (LASIX) injection 80 mg (80 mg Intravenous Given 03/24/22 1814)    ED Course/ Medical Decision Making/ A&P   This patient presents to the ED for concern of tachycardia, this involves an extensive number of treatment options, and is a complaint that carries with it a high risk of complications and morbidity.  The differential diagnosis includes atrial fibs, atrial flutter   Co morbidities that complicate the patient evaluation  History of atrial flutter and heart failure   Additional history obtained:  Additional history obtained from patient and cardiologist records External records from outside source obtained and reviewed including full records   Lab Tests:  I Ordered, and personally interpreted labs.  The pertinent results include: Chemistries unremarkable, white count 4.7   Imaging Studies ordered:  I ordered imaging studies including chest x-ray I independently visualized and interpreted imaging which showed active I agree with the radiologist interpretation   Cardiac Monitoring: / EKG:  The patient was maintained on a cardiac monitor.  I personally viewed and interpreted the cardiac monitored which showed an underlying rhythm of: Atrial  flutter   Consultations Obtained:  I requested consultation with the audiology,  and discussed lab and imaging findings as well as pertinent plan - they recommend: Will admit   Problem List / ED Course / Critical interventions / Medication management  Weakness, congestive heart failure and atrial flutter I ordered medication including Lasix for edema Reevaluation of the patient after these medicines showed that the patient stayed the same I have reviewed the patients home medicines and have made adjustments as needed   Social Determinants of Health:  None   Test / Admission - Considered:  None    Patient has atrial flutter at a rate around 130.  Also he has some congestive heart failure with swelling in his lower extremities.  He was sent from the cardiologist office to be admitted to cardiology.  He is going to be diuresed and possibly cardioverted  Medical Decision Making Amount and/or Complexity of Data Reviewed Radiology: ordered. ECG/medicine tests: ordered.  Risk Prescription drug management. Decision regarding hospitalization.   Patient with atrial flutter and congestive heart failure.  He is admitted to cardiology        Final Clinical Impression(s) / ED Diagnoses Final diagnoses:  Typical atrial flutter [facility])    Rx / DC Orders ED Discharge Orders     None         Milton Ferguson, MD 03/26/22 1106

## 2022-03-24 NOTE — ED Notes (Signed)
Dr Gardiner Rhyme notified of the patient's HR. Dr Gardiner Rhyme reports to give ordered metoprolol

## 2022-03-25 ENCOUNTER — Encounter (HOSPITAL_COMMUNITY): Payer: Self-pay | Admitting: Cardiology

## 2022-03-25 ENCOUNTER — Inpatient Hospital Stay (HOSPITAL_COMMUNITY): Payer: 59 | Admitting: Certified Registered Nurse Anesthetist

## 2022-03-25 ENCOUNTER — Inpatient Hospital Stay (HOSPITAL_COMMUNITY): Payer: 59

## 2022-03-25 ENCOUNTER — Encounter (HOSPITAL_COMMUNITY)
Admission: EM | Disposition: A | Discharge status: Home / Self Care | Payer: Self-pay | Source: Home / Self Care | Attending: Cardiology

## 2022-03-25 ENCOUNTER — Inpatient Hospital Stay: Payer: Self-pay

## 2022-03-25 DIAGNOSIS — N289 Disorder of kidney and ureter, unspecified: Secondary | ICD-10-CM

## 2022-03-25 DIAGNOSIS — I361 Nonrheumatic tricuspid (valve) insufficiency: Secondary | ICD-10-CM

## 2022-03-25 DIAGNOSIS — I4891 Unspecified atrial fibrillation: Secondary | ICD-10-CM

## 2022-03-25 DIAGNOSIS — I509 Heart failure, unspecified: Secondary | ICD-10-CM

## 2022-03-25 DIAGNOSIS — I5043 Acute on chronic combined systolic (congestive) and diastolic (congestive) heart failure: Secondary | ICD-10-CM | POA: Diagnosis not present

## 2022-03-25 DIAGNOSIS — I110 Hypertensive heart disease with heart failure: Secondary | ICD-10-CM

## 2022-03-25 DIAGNOSIS — I4892 Unspecified atrial flutter: Secondary | ICD-10-CM

## 2022-03-25 DIAGNOSIS — I484 Atypical atrial flutter: Secondary | ICD-10-CM | POA: Diagnosis not present

## 2022-03-25 HISTORY — PX: TEE WITHOUT CARDIOVERSION: SHX5443

## 2022-03-25 HISTORY — PX: CARDIOVERSION: SHX1299

## 2022-03-25 LAB — BASIC METABOLIC PANEL
Anion gap: 10 (ref 5–15)
BUN: 12 mg/dL (ref 8–23)
CO2: 27 mmol/L (ref 22–32)
Calcium: 9 mg/dL (ref 8.9–10.3)
Chloride: 100 mmol/L (ref 98–111)
Creatinine, Ser: 1.25 mg/dL — ABNORMAL HIGH (ref 0.61–1.24)
GFR, Estimated: >60 mL/min (ref >60)
Glucose, Bld: 101 mg/dL — ABNORMAL HIGH (ref 70–99)
Potassium: 3.9 mmol/L (ref 3.5–5.1)
Sodium: 137 mmol/L (ref 135–145)

## 2022-03-25 LAB — CBC WITH DIFFERENTIAL/PLATELET
Abs Immature Granulocytes: 0 10*3/uL (ref 0.00–0.07)
Basophils Absolute: 0 10*3/uL (ref 0.0–0.1)
Basophils Relative: 1 %
Eosinophils Absolute: 0.5 10*3/uL (ref 0.0–0.5)
Eosinophils Relative: 10 %
HCT: 42.6 % (ref 39.0–52.0)
Hemoglobin: 13.8 g/dL (ref 13.0–17.0)
Immature Granulocytes: 0 %
Lymphocytes Relative: 39 %
Lymphs Abs: 1.8 10*3/uL (ref 0.7–4.0)
MCH: 31.5 pg (ref 26.0–34.0)
MCHC: 32.4 g/dL (ref 30.0–36.0)
MCV: 97.3 fL (ref 80.0–100.0)
Monocytes Absolute: 0.5 10*3/uL (ref 0.1–1.0)
Monocytes Relative: 11 %
Neutro Abs: 1.9 10*3/uL (ref 1.7–7.7)
Neutrophils Relative %: 39 %
Platelets: 117 10*3/uL — ABNORMAL LOW (ref 150–400)
RBC: 4.38 MIL/uL (ref 4.22–5.81)
RDW: 13.5 % (ref 11.5–15.5)
WBC: 4.7 10*3/uL (ref 4.0–10.5)
nRBC: 0 % (ref 0.0–0.2)

## 2022-03-25 LAB — PROTIME-INR
INR: 1.2 (ref 0.8–1.2)
Prothrombin Time: 15.3 seconds — ABNORMAL HIGH (ref 11.4–15.2)

## 2022-03-25 LAB — HEPARIN LEVEL (UNFRACTIONATED)
Heparin Unfractionated: 0.2 IU/mL — ABNORMAL LOW (ref 0.30–0.70)
Heparin Unfractionated: 0.46 IU/mL (ref 0.30–0.70)

## 2022-03-25 LAB — HEMOGLOBIN A1C
Hgb A1c MFr Bld: 6.2 % — ABNORMAL HIGH (ref 4.8–5.6)
Mean Plasma Glucose: 131 mg/dL

## 2022-03-25 LAB — TSH: TSH: 0.567 u[IU]/mL (ref 0.350–4.500)

## 2022-03-25 SURGERY — ECHOCARDIOGRAM, TRANSESOPHAGEAL
Anesthesia: Monitor Anesthesia Care

## 2022-03-25 MED ORDER — SODIUM CHLORIDE 0.9 % IV SOLN: INTRAVENOUS | Status: DC

## 2022-03-25 MED ORDER — SPIRONOLACTONE 12.5 MG HALF TABLET
12.5000 mg | ORAL_TABLET | Freq: Every day | ORAL | Status: DC
  Filled 2022-03-25: qty 1

## 2022-03-25 MED ORDER — EPINEPHRINE 1 MG/10ML IJ SOSY
PREFILLED_SYRINGE | INTRAMUSCULAR | Status: DC | PRN
  Administered 2022-03-25: 100 ug via INTRAVENOUS

## 2022-03-25 MED ORDER — PHENYLEPHRINE 80 MCG/ML (10ML) SYRINGE FOR IV PUSH (FOR BLOOD PRESSURE SUPPORT)
PREFILLED_SYRINGE | INTRAVENOUS | Status: DC | PRN
  Administered 2022-03-25: 80 ug via INTRAVENOUS
  Administered 2022-03-25: 240 ug via INTRAVENOUS
  Administered 2022-03-25: 160 ug via INTRAVENOUS
  Administered 2022-03-25: 240 ug via INTRAVENOUS
  Administered 2022-03-25: 160 ug via INTRAVENOUS
  Administered 2022-03-25: 320 ug via INTRAVENOUS
  Administered 2022-03-25: 400 ug via INTRAVENOUS

## 2022-03-25 MED ORDER — VASOPRESSIN 20 UNIT/ML IV SOLN
INTRAVENOUS | Status: DC | PRN
  Administered 2022-03-25: 1 [IU] via INTRAVENOUS

## 2022-03-25 MED ORDER — ALBUMIN HUMAN 5 % IV SOLN
12.5000 g | Freq: Once | INTRAVENOUS | Status: AC
  Administered 2022-03-25: 12.5 g via INTRAVENOUS

## 2022-03-25 MED ORDER — NOREPINEPHRINE 4 MG/250ML-% IV SOLN
3.0000 ug/min | INTRAVENOUS | Status: DC
  Administered 2022-03-25: 5 ug/min via INTRAVENOUS
  Administered 2022-03-25: 3 ug/min via INTRAVENOUS
  Filled 2022-03-25 (×2): qty 250

## 2022-03-25 MED ORDER — SACUBITRIL-VALSARTAN 24-26 MG PO TABS: 1.0000 | ORAL_TABLET | Freq: Two times a day (BID) | ORAL | Status: DC

## 2022-03-25 MED ORDER — HEPARIN BOLUS VIA INFUSION
1500.0000 [IU] | Freq: Once | INTRAVENOUS | Status: AC
  Administered 2022-03-25: 1500 [IU] via INTRAVENOUS
  Filled 2022-03-25: qty 1500

## 2022-03-25 MED ORDER — FUROSEMIDE 10 MG/ML IJ SOLN
80.0000 mg | Freq: Two times a day (BID) | INTRAMUSCULAR | Status: DC
  Administered 2022-03-25 – 2022-03-27 (×5): 80 mg via INTRAVENOUS
  Filled 2022-03-25 (×5): qty 8

## 2022-03-25 MED ORDER — PHENYLEPHRINE HCL-NACL 20-0.9 MG/250ML-% IV SOLN
INTRAVENOUS | Status: DC | PRN
  Administered 2022-03-25: 50 ug/min via INTRAVENOUS

## 2022-03-25 MED ORDER — ALBUTEROL SULFATE HFA 108 (90 BASE) MCG/ACT IN AERS
INHALATION_SPRAY | RESPIRATORY_TRACT | Status: DC | PRN
  Administered 2022-03-25: 6 via RESPIRATORY_TRACT

## 2022-03-25 MED ORDER — ALBUMIN HUMAN 5 % IV SOLN
INTRAVENOUS | Status: AC
  Filled 2022-03-25: qty 250

## 2022-03-25 MED ORDER — LIDOCAINE 2% (20 MG/ML) 5 ML SYRINGE
INTRAMUSCULAR | Status: DC | PRN
  Administered 2022-03-25: 50 mg via INTRAVENOUS

## 2022-03-25 MED ORDER — DIGOXIN 125 MCG PO TABS
0.1250 mg | ORAL_TABLET | Freq: Every day | ORAL | Status: DC
  Administered 2022-03-25 – 2022-03-29 (×5): 0.125 mg via ORAL
  Filled 2022-03-25 (×5): qty 1

## 2022-03-25 MED ORDER — PROPOFOL 500 MG/50ML IV EMUL
INTRAVENOUS | Status: DC | PRN
  Administered 2022-03-25: 125 ug/kg/min via INTRAVENOUS

## 2022-03-25 MED ORDER — AMIODARONE HCL IN DEXTROSE 360-4.14 MG/200ML-% IV SOLN
30.0000 mg/h | INTRAVENOUS | Status: DC
  Administered 2022-03-25 – 2022-03-27 (×6): 30 mg/h via INTRAVENOUS
  Filled 2022-03-25 (×9): qty 200

## 2022-03-25 MED ORDER — HEPARIN (PORCINE) 25000 UT/250ML-% IV SOLN
INTRAVENOUS | Status: AC
  Filled 2022-03-25: qty 250

## 2022-03-25 MED ORDER — CHLORHEXIDINE GLUCONATE CLOTH 2 % EX PADS
6.0000 | MEDICATED_PAD | Freq: Every day | CUTANEOUS | Status: DC
  Administered 2022-03-25 – 2022-03-28 (×4): 6 via TOPICAL

## 2022-03-25 MED ORDER — AMIODARONE HCL IN DEXTROSE 360-4.14 MG/200ML-% IV SOLN
60.0000 mg/h | INTRAVENOUS | Status: AC
  Administered 2022-03-25: 60 mg/h via INTRAVENOUS
  Filled 2022-03-25: qty 200

## 2022-03-25 NOTE — Consult Note (Addendum)
Advanced Heart Failure Team Consult Note   Primary Physician: Lucianne Lei, MD PCP-Cardiologist:  Glori Bickers, MD  Reason for Consultation: A/C HFrEF  HPI:    [name] is seen today for evaluation of heart failure at the request of Dr Gardiner Rhyme.   [name] is a 72 y.o. male with a history of persistent afib (s/p ablation 2012, DCCV 09/12/17), HTN, and chronic systolic HF.    Underwent DC-CV of AF on 09/12/17.    Admitted 6/27-09/18/17 from afib clinic with volume overload and AKI. HF team consulted. Echo showed EF 15-20%. Diuresed 29 lbs with IV lasix. Transitioned to lasix 80/40. HF medications optimized as able. AKI resolved with diuresis. DC weight: 263 lbs.    Last seen 05/2018, volume up in setting of AFL with RVR. Taken to ED for diuresis and DCCV, converted to NSR. Unfortunately, lost to follow up. He has been off anticoagulants for a few years.   About 3-4 months ago he started taking testosterone 3 times a week and started noticing weight gain. He stopped testosterone injections 6 weeks ago. Weight has gone up at least 20 pounds.   Yesterday he was seen in the HF clinic and was found to be in A flutter RVR and volume overloaded.   Sent to the ED for evaluation. Admitted by cardiology and started on IV lasix. Stable in ED so urgent cardioversion was not pursued. Placed on lopressor and heparin for A flutter. . Plan for TEE/DC-CV today if scheduling available. CXR was ok. Pertinent admission labs included: lactic acid 1.2, BNP 287, creatinine 1.2, TSH 0.669, Hgb 13.4, and WBC  4.1.   Feels ok. Denis SOB.   Cardiac Studies  Echo 10/19 EF 45-50%, diffuse HK, mild MR, LA severely dilated, RA mildly dilated, RV normal   - TEE 09/12/17: EF 15-20%, diffuse HK, mild to mod MR, RV systolic function reduced, RA and LA dilated   - LHC 06/12/2010 1. Normal coronaries, mild global LV dysfunction, most likely AFib mediated. 2. No mitral regurgitation.   Review of  Systems: [y] = yes, [ ]  = no   General: Weight gain [ Y]; Weight loss [ ] ; Anorexia [ ] ; Fatigue [ ] ; Fever [ ] ; Chills [ ] ; Weakness [Y ]  Cardiac: Chest pain/pressure [ ] ; Resting SOB [ ] ; Exertional SOB [ ] ; Orthopnea [ ] ; Pedal Edema [Y ]; Palpitations [ ] ; Syncope [ ] ; Presyncope [ ] ; Paroxysmal nocturnal dyspnea[ ]   Pulmonary: Cough [ ] ; Wheezing[ ] ; Hemoptysis[ ] ; Sputum [ ] ; Snoring [ ]   GI: Vomiting[ ] ; Dysphagia[ ] ; Melena[ ] ; Hematochezia [ ] ; Heartburn[ ] ; Abdominal pain [ ] ; Constipation [ ] ; Diarrhea [ ] ; BRBPR [ ]   GU: Hematuria[ ] ; Dysuria [ ] ; Nocturia[ ]   Vascular: Pain in legs with walking [ ] ; Pain in feet with lying flat [ ] ; Non-healing sores [ ] ; Stroke [ ] ; TIA [ ] ; Slurred speech [ ] ;  Neuro: Headaches[ ] ; Vertigo[ ] ; Seizures[ ] ; Paresthesias[ ] ;Blurred vision [ ] ; Diplopia [ ] ; Vision changes [ ]   Ortho/Skin: Arthritis [ ] ; Joint pain [ ] ; Muscle pain [ ] ; Joint swelling [ ] ; Back Pain [ ] ; Rash [ ]   Psych: Depression[ ] ; Anxiety[ ]   Heme: Bleeding problems [ ] ; Clotting disorders [ ] ; Anemia [ ]   Endocrine: Diabetes [ ] ; Thyroid dysfunction[ ]   Home Medications Prior to Admission medications   Medication Sig Start Date End Date Taking? Authorizing Provider  furosemide (LASIX) 80 MG tablet  TAKE 1 TABLET(80 MG) BY MOUTH DAILY. MAY TAKE AN ADDITIONAL 40 MG AS NEEDED Patient taking differently: Take 40-80 mg by mouth See admin instructions. 80 mg every morning, may take an additional 40 mg as needed for fluid retention. 02/22/22  Yes Bensimhon, Shaune Pascal, MD  OVER THE COUNTER MEDICATION Take 5 mLs by mouth in the morning. Black seed oil.   Yes [provider]  sacubitril-valsartan (ENTRESTO) 97-103 MG TAKE 1 TABLET BY MOUTH TWICE DAILY Patient taking differently: Take 1 tablet by mouth 2 (two) times daily. 02/22/22  Yes Bensimhon, Shaune Pascal, MD  metoprolol succinate (TOPROL-XL) 100 MG 24 hr tablet TAKE 1 TABLET BY MOUTH AT BEDTIME, TAKE WITH OR IMMEDIATELY FOLLOWING  A MEAL Patient not taking: Reported on 03/24/2022 10/31/18   Bensimhon, Shaune Pascal, MD  sildenafil (VIAGRA) 25 MG tablet Take 1 tablet (25 mg total) by mouth daily as needed for erectile dysfunction. Patient not taking: Reported on 03/24/2022 01/02/18   Bensimhon, Shaune Pascal, MD  spironolactone (ALDACTONE) 25 MG tablet TAKE 1 TABLET(25 MG) BY MOUTH DAILY Patient not taking: Reported on 03/24/2022 10/30/18   Bensimhon, Shaune Pascal, MD  XARELTO 20 MG TABS tablet TAKE 1 TABLET(20 MG) BY MOUTH DAILY WITH SUPPER Patient not taking: Reported on 03/24/2022 10/29/18   Bensimhon, Shaune Pascal, MD    Past Medical History: Past Medical History:  Diagnosis Date   Atrial tachycardia    Chronic HFrEF (heart failure with reduced ejection fraction) ([place])    Hypertension    Mitral regurgitation    NICM (nonischemic cardiomyopathy) ([place])    a. 2009 EF 25%; b. 05/2010 Cath: nl cors. EF 45%; c. 12/2017 Echo: EF 45-50%, diff HK, mild MR, sev dil LA.   Paroxysmal atrial flutter ([place])    a. Dx in 05/2018.   Persistent atrial fibrillation ([place])    a. 2012 s/p RFCA; b. 08/2017 s/p DCCV.    Past Surgical History: Past Surgical History:  Procedure Laterality Date   CARDIAC ELECTROPHYSIOLOGY STUDY AND ABLATION  2012   Dr. Viann Shove   CARDIOVERSION N/A 09/12/2017   Procedure: CARDIOVERSION;  Surgeon: Jerline Pain, MD;  Location: [facility] ENDOSCOPY;  Service: Cardiovascular;  Laterality: N/A;   LOOP RECORDER IMPLANT  06/11/2010   TEE WITHOUT CARDIOVERSION N/A 09/12/2017   Procedure: TRANSESOPHAGEAL ECHOCARDIOGRAM (TEE);  Surgeon: Jerline Pain, MD;  Location: [facility] ENDOSCOPY;  Service: Cardiovascular;  Laterality: N/A;    Family History: History reviewed. No pertinent family history.  Social History: Social History   Socioeconomic History   Marital status: Widowed    Spouse name: Not on file   Number of children: Not on file   Years of education: Not on file   Highest education level: Not on file  Occupational History   Not on  file  Tobacco Use   Smoking status: Never   Smokeless tobacco: Never  Vaping Use   Vaping Use: Never used  Substance and Sexual Activity   Alcohol use: Yes    Comment: rare   Drug use: Never   Sexual activity: Not on file  Other Topics Concern   Not on file  Social History Narrative   Not on file   Social Determinants of Health   Financial Resource Strain: Not on file  Food Insecurity: Not on file  Transportation Needs: Not on file  Physical Activity: Not on file  Stress: Not on file  Social Connections: Not on file    Allergies:  Allergies  Allergen Reactions   Pacerone [  Amiodarone] Itching and Rash    Objective:    Vital Signs:   Temp:  [97.8 F (36.6 C)-98.1 F (36.7 C)] 97.8 F (36.6 C) (01/05 0406) Pulse Rate:  [121-142] 121 (01/05 0600) Resp:  [15-37] 24 (01/05 0600) BP: (91-125)/(68-98) 95/77 (01/05 0600) SpO2:  [94 %-100 %] 95 % (01/05 0600) Weight:  [123.8 kg] 123.8 kg (01/04 1900) Last BM Date : 03/25/22  Weight change: Filed Weights   03/24/22 1900  Weight: 123.8 kg    Intake/Output:  No intake or output data in the 24 hours ending 03/25/22 0645    Physical Exam    General:   No resp difficulty HEENT: normal Neck: supple. JVP 7-8  Carotids 2+ bilat; no bruits. No lymphadenopathy or thyromegaly appreciated. Cor: PMI nondisplaced. Tachy Regular rate & rhythm. No rubs, gallops or murmurs. Lungs: clear Abdomen: soft, nontender, nondistended. No hepatosplenomegaly. No bruits or masses. Good bowel sounds. Extremities: no cyanosis, clubbing, rash, compression stockings on with trace edema Neuro: alert & orientedx3, cranial nerves grossly intact. moves all 4 extremities w/o difficulty. Affect pleasant   Telemetry   A flutter 130s   EKG     Aflutter 135 on admit   Labs   Basic Metabolic Panel: Recent Labs  Lab 03/24/22 1144 03/25/22 0510  NA 138 137  K 4.2 3.9  CL 102 100  CO2 28 27  GLUCOSE 104* 101*  BUN 11 12  CREATININE  1.21 1.25*  CALCIUM 9.1 9.0  MG 2.1  --     Liver Function Tests: Recent Labs  Lab 03/24/22 1144  AST 25  ALT 20  ALKPHOS 60  BILITOT 0.7  PROT 6.9  ALBUMIN 3.2*   No results for input(s): "LIPASE", "AMYLASE" in the last 168 hours. No results for input(s): "AMMONIA" in the last 168 hours.  CBC: Recent Labs  Lab 03/24/22 1144  WBC 4.1  HGB 13.4  HCT 39.7  MCV 100.0  PLT 129*    Cardiac Enzymes: No results for input(s): "CKTOTAL", "CKMB", "CKMBINDEX", "TROPONINI" in the last 168 hours.  BNP: BNP (last 3 results) Recent Labs    03/24/22 1144  BNP 287.5*    ProBNP (last 3 results) No results for input(s): "PROBNP" in the last 8760 hours.   CBG: No results for input(s): "GLUCAP" in the last 168 hours.  Coagulation Studies: No results for input(s): "LABPROT", "INR" in the last 72 hours.   Imaging   DG Chest Port 1 View  Result Date: 03/24/2022 CLINICAL DATA:  Weak EXAM: PORTABLE CHEST 1 VIEW COMPARISON:  05/24/2018 FINDINGS: Cardiomegaly. Unchanged right basilar scarring or atelectasis. Left lung is normally aerated. Osseous structures unremarkable. IMPRESSION: Cardiomegaly. Unchanged right basilar scarring or atelectasis. No acute appearing airspace opacity. Electronically Signed   By: Delanna Ahmadi M.D.   On: 03/24/2022 18:12     Medications:     Current Medications:  furosemide  80 mg Intravenous BID   heparin  1,500 Units Intravenous Once   metoprolol tartrate  25 mg Oral Q6H   sacubitril-valsartan  1 tablet Oral BID   sodium chloride flush  3 mL Intravenous Q12H   spironolactone  12.5 mg Oral Daily    Infusions:  sodium chloride     heparin 1,600 Units/hr (03/25/22 0634)      Patient Profile  [name] is a 72 y.o. male with a history of persistent afib (s/p ablation 2012 at [facility], [place] 2019 & 2020, HTN, and chronic systolic HF.   Admitted with  A flutter RVR and volume overload.   Assessment/Plan   1. A flutter RVR Had ablation  2012 and DC-CV 2019 and 2020 - Remains in A flutter.  - Given on lopressor + heparin drip.  -NPO for TEE/DC-CV today   - Switch to eliquis later today.  -Refer back EP for follow up and possible ablation.   2. A/C HFrEF -EF 25% as far back as 2009.  - LHC 2012: normal coronaries. No ICD.  - Echo 09/12/17: EF 15-20%, diffuse HK, mild to mod MR - Echo 10/19  EF 45-50%, diffuse HK, mild MR, LA severely dilated, RA mildly dilated, RV normal - NYHA II. Repeat ECHO in am after cardioversion.  - BNP 287. Mild volume overload. Continue IV lasix.  - Continue lopressor for now and plan to switch to Toprol Xl after cardioversion - Continue entresto  - Hold Spironolactone.  -Renal function stable.    Length of Stay: 1  Amy Clegg, NP  03/25/2022, 6:45 AM  Advanced Heart Failure Team Pager [phone] (M-F; 7a - 5p)  Please contact [facility] for night-coverage after hours (4p -7a ) and weekends on [email or web address]  Patient seen with NP, agree with the above note.   He was sent to ER from clinic yesterday with AFL/RVR (versus atrial tachy).  He was asymptomatic, had just presented for a regular office appointment.  HR in 130s.  He denies dyspnea.    This morning, he remain in atypical atrial flutter vs atrial tachycardia rate 130s. He was started on heparin gtt.   General: NAD Neck: JVP 14-16 cm, no thyromegaly or thyroid nodule.  Lungs: Clear to auscultation bilaterally with normal respiratory effort. CV: Nondisplaced PMI.  Heart tachy, regular S1/S2, no S3/S4, no murmur.  1+ edema to knees.  No carotid bruit.  Normal pedal pulses.  Abdomen: Soft, nontender, no hepatosplenomegaly, no distention.  Skin: Intact without lesions or rashes.  Neurologic: Alert and oriented x 3.  Psych: Normal affect. Extremities: No clubbing or cyanosis.  HEENT: Normal.   Patient had atrial fibrillation ablation in the past but also has history of subsequent atrial flutter. At home, he was not taking Xarelto (had  been on in the past). He does not feel the flutter so is not sure how long he has been in it.  - Continue heparin gtt.  - He has been started on metoprolol 25 q6 hrs, would stop after cardioversion especially if EF has dropped on TEE.  - TEE-guided DCCV today.  - Outpatient followup with EP to consider ablation.   He is volume overloaded on exam today, suspect this was triggered by atypical atrial flutter/AT with RVR.  He denies dyspnea. Last echo showed EF 45-50% in 2019.  - Will get TEE today but would repeat TTE when he is in NSR.  - Lasix 80 mg IV bid, replace K.  - Continue Entresto at 24/26 bid for now.   Marca Ancona 03/25/2022 7:29 AM

## 2022-03-25 NOTE — Progress Notes (Addendum)
S/p TEE/DCCV>>NSR   Was CTB in PACU for hypotension. SBP 70s. Verified x 2 by manual BP cuff.   Pt remains drowsy post sedation, easily awakens. Denies lightheadedness/dizziness. No respiratory difficulty. Remains in NSR. JVD ~10 cm   D/w Dr. Aundra Mcmillan. Will start NE to support BP. Will start at 3 mcg/kg/min. Titrate as needed. Will place PICC for Co-ox and CVP monitoring. Start amio gtt at 60/hr to keep in NSR.   Bed request changed to ICU bed.   Discussed plan w/ RN.   Lyda Jester, PA-C   Agree with PA note.   Patient now on NE 5 with stable BP.  Started on amiodarone gtt 60 mg/hr, tolerating so far.  He is on heparin gtt.  - Place PICC, follow CVP/co-ox.  - Will continue amiodarone for now, had itching past with it apparently.  Stop metoprolol.  - Hold Entresto until BP stabilizes.  - Continue Lasix.  - Wean off NE.  - Add digoxin.   Loralie Champagne 03/25/2022 2:59 PM

## 2022-03-25 NOTE — ED Notes (Signed)
Cardiologist reports that it is safe to leave the patient's HR in the 130s and 140s and Cardiology reports that they will not be starting any cardiac drips

## 2022-03-25 NOTE — Progress Notes (Signed)
  Echocardiogram Echocardiogram Transesophageal has been performed.  [name] 03/25/2022, 9:30 AM

## 2022-03-25 NOTE — Anesthesia Preprocedure Evaluation (Signed)
Anesthesia Evaluation  Patient identified by MRN, date of birth, ID band Patient awake    Reviewed: Allergy & Precautions, NPO status , Patient's Chart, lab work & pertinent test results  History of Anesthesia Complications Negative for: history of anesthetic complications  Airway Mallampati: I  TM Distance: >3 FB Neck ROM: Full    Dental  (+) Missing,    Pulmonary neg pulmonary ROS   breath sounds clear to auscultation       Cardiovascular hypertension, Pt. on medications and Pt. on home beta blockers +CHF  + dysrhythmias Atrial Fibrillation  Rhythm:Irregular  Left ventricle: The cavity size was severely dilated. Wall    thickness was normal. Systolic function was mildly reduced. The    estimated ejection fraction was in the range of 45% to 50%.    Diffuse hypokinesis.  - Mitral valve: Calcified annulus. There was mild regurgitation.  - Left atrium: The atrium was severely dilated.  - Right atrium: The atrium was mildly dilated.     Neuro/Psych negative neurological ROS  negative psych ROS   GI/Hepatic negative GI ROS, Neg liver ROS,,,  Endo/Other  negative endocrine ROS    Renal/GU Renal InsufficiencyRenal diseaseLab Results      Component                Value               Date                      CREATININE               1.25 (H)            03/25/2022                Musculoskeletal negative musculoskeletal ROS (+)    Abdominal   Peds  Hematology Lab Results      Component                Value               Date                      WBC                      4.7                 03/25/2022                HGB                      13.8                03/25/2022                HCT                      42.6                03/25/2022                MCV                      97.3                03/25/2022                PLT  117 (L)             03/25/2022              Anesthesia Other  Findings   Reproductive/Obstetrics                             Anesthesia Physical Anesthesia Plan  ASA: 3  Anesthesia Plan: MAC and General   Post-op Pain Management:    Induction:   PONV Risk Score and Plan: 2 and Propofol infusion  Airway Management Planned: Nasal Cannula, Natural Airway and Simple Face Mask  Additional Equipment: None  Intra-op Plan:   Post-operative Plan:   Informed Consent: I have reviewed the patients History and Physical, chart, labs and discussed the procedure including the risks, benefits and alternatives for the proposed anesthesia with the patient or authorized representative who has indicated his/her understanding and acceptance.     Dental advisory given  Plan Discussed with: CRNA  Anesthesia Plan Comments:        Anesthesia Quick Evaluation

## 2022-03-25 NOTE — Progress Notes (Signed)
ANTICOAGULATION CONSULT NOTE    Pharmacy Consult for heparin  Indication: atrial fibrillation  Allergies  Allergen Reactions   Pacerone [Amiodarone] Itching and Rash    Patient Measurements: Height: 6\' 4"  (193 cm) Weight: 123.8 kg (272 lb 14.9 oz) IBW/kg (Calculated) : 86.8 Heparin Dosing Weight: 113.1kg   Vital Signs: Temp: 97.7 F (36.5 C) (01/05 0935) Temp Source: Temporal (01/05 0819) BP: 95/72 (01/05 1300) Pulse Rate: 84 (01/05 1400)  Labs: Recent Labs    03/24/22 1144 03/24/22 1512 03/25/22 0510 03/25/22 1325  HGB 13.4  --  13.8  --   HCT 39.7  --  42.6  --   PLT 129*  --  117*  --   LABPROT  --   --   --  15.3*  INR  --   --   --  1.2  HEPARINUNFRC  --   --  0.20* 0.46  CREATININE 1.21  --  1.25*  --   TROPONINIHS  --  15  --   --      Estimated Creatinine Clearance: 77.9 mL/min (A) (by C-G formula based on SCr of 1.25 mg/dL (H)).   Medical History: Past Medical History:  Diagnosis Date   Atrial tachycardia    Chronic HFrEF (heart failure with reduced ejection fraction) ([facility])    Hypertension    Mitral regurgitation    NICM (nonischemic cardiomyopathy) ([place])    a. 2009 EF 25%; b. 05/2010 Cath: nl cors. EF 45%; c. 12/2017 Echo: EF 45-50%, diff HK, mild MR, sev dil LA.   Paroxysmal atrial flutter ([place])    a. Dx in 05/2018.   Persistent atrial fibrillation ([place])    a. 2012 s/p RFCA; b. 08/2017 s/p DCCV.    Assessment: Patient admitted for afib and fluid overload. Hx of Afib previously on Xarelto but has been off for sometime. Unknown why was stopped. Cardiology consulted, heparin to be used until TEE/DCCV in AM.   Heparin level this afternoon 0.46- at goal.  No overt bleeding or complications noted.     Goal of Therapy:  Heparin level 0.3-0.7 units/ml Monitor platelets by anticoagulation protocol: Yes   Plan:  Continue IV heparin at current rate Daily heparin level and CBC. Continue to monitor H&H and platelets  [place] Crane, Roylene Reason,  BCCP Clinical Pharmacist  03/25/2022 3:07 PM   [facility] pharmacy phone numbers are listed on [email or web address]

## 2022-03-25 NOTE — CV Procedure (Signed)
Procedure: TEE  Sedation: Per anesthesiology  Indication: Atrial flutter with RVR  Findings: Please see echo section for full report.  The left ventricle was normal in size with normal wall thickness.  EF 20-25% with diffuse hypokinesis.  The RV was mildly dilated with moderate systolic dysfunction.  Severe right atrial enlargement.  Severe left atrial enlargement, no LA appendage thrombus.  There was a PFO noted by color doppler. Severe TR, peak RV-RA gradient 39 mmHg.  Trileaflet aortic valve, no stenosis or regurgitation.  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 PISA ERO 0.44 cm^2. There was flattening but not flow reversal of the pulmonary venous systolic doppler signal.    Post-TEE, patient became hypotensive and hypoxemic. He required bag-mask ventilation and epinephrine dose. He recovered with time.   Impression: No LA appendage thrombus, proceed to DCCV.   Loralie Champagne 03/25/2022 9:25 AM

## 2022-03-25 NOTE — Progress Notes (Signed)
Heart Failure Navigator Progress Note  Assessed for Heart & Vascular TOC clinic readiness.  Patient does not meet criteria due to Advanced Heart Failure team consult with Dr. Haroldine Laws..   Navigator will sign off at this time.   Earnestine Leys, BSN, Clinical cytogeneticist Only

## 2022-03-25 NOTE — Transfer of Care (Signed)
Immediate Anesthesia Transfer of Care Note  Patient: [name]  Procedure(s) Performed: TRANSESOPHAGEAL ECHOCARDIOGRAM (TEE) CARDIOVERSION  Patient Location: PACU  Anesthesia Type:MAC  Level of Consciousness: awake, alert , and oriented  Airway & Oxygen Therapy: Patient Spontanous Breathing and Patient connected to face mask oxygen  Post-op Assessment: Report given to RN, Post -op Vital signs reviewed and stable, and Patient moving all extremities X 4  Post vital signs: Reviewed and stable  Last Vitals:  Vitals Value Taken Time  BP 80/60 03/25/22 0938  Temp    Pulse 67 03/25/22 0941  Resp 27 03/25/22 0941  SpO2 93 % 03/25/22 0941  Vitals shown include unvalidated device data.  Last Pain:  Vitals:   03/25/22 0819  TempSrc: Temporal  PainSc: 0-No pain         Complications: No notable events documented.

## 2022-03-25 NOTE — ED Notes (Signed)
Patient placed in hospital bed

## 2022-03-25 NOTE — ED Notes (Signed)
Cardiology at bedside.

## 2022-03-25 NOTE — ED Notes (Signed)
Pt transported to endo.  

## 2022-03-25 NOTE — Anesthesia Procedure Notes (Signed)
Procedure Name: MAC Date/Time: 03/25/2022 8:46 AM  Performed by: Heide Scales, CRNAPre-anesthesia Checklist: Patient identified, Emergency Drugs available, Suction available and Patient being monitored Patient Re-evaluated:Patient Re-evaluated prior to induction Oxygen Delivery Method: Nasal cannula Preoxygenation: Pre-oxygenation with 100% oxygen Induction Type: IV induction Dental Injury: Teeth and Oropharynx as per pre-operative assessment

## 2022-03-25 NOTE — Interval H&P Note (Signed)
History and Physical Interval Note:  03/25/2022 8:52 AM  [name]  has presented today for surgery, with the diagnosis of afib.  The various methods of treatment have been discussed with the patient and family. After consideration of risks, benefits and other options for treatment, the patient has consented to  Procedure(s): TRANSESOPHAGEAL ECHOCARDIOGRAM (TEE) (N/A) CARDIOVERSION (N/A) as a surgical intervention.  The patient's history has been reviewed, patient examined, no change in status, stable for surgery.  I have reviewed the patient's chart and labs.  Questions were answered to the patient's satisfaction.     Rocky Rishel [employer]

## 2022-03-25 NOTE — ED Notes (Addendum)
Per cardiologist at bedside; we are choosing not to cardiovert at this time because patient is hemodynamically stable at this time and cardiology is choosing to perform an TEE  for safety due to the unknown time that the tachycardia started. Cardiologist reports that if the patient becomes hemodynamically unstable then at that time the benefits of cardioverting would outweigh the risks and would be preformed at that time. Cardiologist reports awareness of the tachycardia in the 130s and verbalizes understanding at this time that the only medication ordered is oral metoprolol.

## 2022-03-25 NOTE — H&P (View-Only) (Signed)
Advanced Heart Failure Team Consult Note   Primary Physician: Lucianne Lei, MD PCP-Cardiologist:  Glori Bickers, MD  Reason for Consultation: A/C HFrEF  HPI:    [name] is seen today for evaluation of heart failure at the request of Dr Gardiner Rhyme.   [name] is a 72 y.o. male with a history of persistent afib (s/p ablation 2012, DCCV 09/12/17), HTN, and chronic systolic HF.    Underwent DC-CV of AF on 09/12/17.    Admitted 6/27-09/18/17 from afib clinic with volume overload and AKI. HF team consulted. Echo showed EF 15-20%. Diuresed 29 lbs with IV lasix. Transitioned to lasix 80/40. HF medications optimized as able. AKI resolved with diuresis. DC weight: 263 lbs.    Last seen 05/2018, volume up in setting of AFL with RVR. Taken to ED for diuresis and DCCV, converted to NSR. Unfortunately, lost to follow up. He has been off anticoagulants for a few years.   About 3-4 months ago he started taking testosterone 3 times a week and started noticing weight gain. He stopped testosterone injections 6 weeks ago. Weight has gone up at least 20 pounds.   Yesterday he was seen in the HF clinic and was found to be in A flutter RVR and volume overloaded.   Sent to the ED for evaluation. Admitted by cardiology and started on IV lasix. Stable in ED so urgent cardioversion was not pursued. Placed on lopressor and heparin for A flutter. . Plan for TEE/DC-CV today if scheduling available. CXR was ok. Pertinent admission labs included: lactic acid 1.2, BNP 287, creatinine 1.2, TSH 0.669, Hgb 13.4, and WBC  4.1.   Feels ok. Denis SOB.   Cardiac Studies  Echo 10/19 EF 45-50%, diffuse HK, mild MR, LA severely dilated, RA mildly dilated, RV normal   - TEE 09/12/17: EF 15-20%, diffuse HK, mild to mod MR, RV systolic function reduced, RA and LA dilated   - LHC 06/12/2010 1. Normal coronaries, mild global LV dysfunction, most likely AFib mediated. 2. No mitral regurgitation.   Review of  Systems: [y] = yes, [ ]  = no   General: Weight gain [ Y]; Weight loss [ ] ; Anorexia [ ] ; Fatigue [ ] ; Fever [ ] ; Chills [ ] ; Weakness [Y ]  Cardiac: Chest pain/pressure [ ] ; Resting SOB [ ] ; Exertional SOB [ ] ; Orthopnea [ ] ; Pedal Edema [Y ]; Palpitations [ ] ; Syncope [ ] ; Presyncope [ ] ; Paroxysmal nocturnal dyspnea[ ]   Pulmonary: Cough [ ] ; Wheezing[ ] ; Hemoptysis[ ] ; Sputum [ ] ; Snoring [ ]   GI: Vomiting[ ] ; Dysphagia[ ] ; Melena[ ] ; Hematochezia [ ] ; Heartburn[ ] ; Abdominal pain [ ] ; Constipation [ ] ; Diarrhea [ ] ; BRBPR [ ]   GU: Hematuria[ ] ; Dysuria [ ] ; Nocturia[ ]   Vascular: Pain in legs with walking [ ] ; Pain in feet with lying flat [ ] ; Non-healing sores [ ] ; Stroke [ ] ; TIA [ ] ; Slurred speech [ ] ;  Neuro: Headaches[ ] ; Vertigo[ ] ; Seizures[ ] ; Paresthesias[ ] ;Blurred vision [ ] ; Diplopia [ ] ; Vision changes [ ]   Ortho/Skin: Arthritis [ ] ; Joint pain [ ] ; Muscle pain [ ] ; Joint swelling [ ] ; Back Pain [ ] ; Rash [ ]   Psych: Depression[ ] ; Anxiety[ ]   Heme: Bleeding problems [ ] ; Clotting disorders [ ] ; Anemia [ ]   Endocrine: Diabetes [ ] ; Thyroid dysfunction[ ]   Home Medications Prior to Admission medications   Medication Sig Start Date End Date Taking? Authorizing Provider  furosemide (LASIX) 80 MG tablet  TAKE 1 TABLET(80 MG) BY MOUTH DAILY. MAY TAKE AN ADDITIONAL 40 MG AS NEEDED Patient taking differently: Take 40-80 mg by mouth See admin instructions. 80 mg every morning, may take an additional 40 mg as needed for fluid retention. 02/22/22  Yes Bensimhon, Shaune Pascal, MD  OVER THE COUNTER MEDICATION Take 5 mLs by mouth in the morning. Black seed oil.   Yes [provider]  sacubitril-valsartan (ENTRESTO) 97-103 MG TAKE 1 TABLET BY MOUTH TWICE DAILY Patient taking differently: Take 1 tablet by mouth 2 (two) times daily. 02/22/22  Yes Bensimhon, Shaune Pascal, MD  metoprolol succinate (TOPROL-XL) 100 MG 24 hr tablet TAKE 1 TABLET BY MOUTH AT BEDTIME, TAKE WITH OR IMMEDIATELY FOLLOWING  A MEAL Patient not taking: Reported on 03/24/2022 10/31/18   Bensimhon, Shaune Pascal, MD  sildenafil (VIAGRA) 25 MG tablet Take 1 tablet (25 mg total) by mouth daily as needed for erectile dysfunction. Patient not taking: Reported on 03/24/2022 01/02/18   Bensimhon, Shaune Pascal, MD  spironolactone (ALDACTONE) 25 MG tablet TAKE 1 TABLET(25 MG) BY MOUTH DAILY Patient not taking: Reported on 03/24/2022 10/30/18   Bensimhon, Shaune Pascal, MD  XARELTO 20 MG TABS tablet TAKE 1 TABLET(20 MG) BY MOUTH DAILY WITH SUPPER Patient not taking: Reported on 03/24/2022 10/29/18   Bensimhon, Shaune Pascal, MD    Past Medical History: Past Medical History:  Diagnosis Date   Atrial tachycardia    Chronic HFrEF (heart failure with reduced ejection fraction) ([place])    Hypertension    Mitral regurgitation    NICM (nonischemic cardiomyopathy) ([place])    a. 2009 EF 25%; b. 05/2010 Cath: nl cors. EF 45%; c. 12/2017 Echo: EF 45-50%, diff HK, mild MR, sev dil LA.   Paroxysmal atrial flutter ([place])    a. Dx in 05/2018.   Persistent atrial fibrillation ([place])    a. 2012 s/p RFCA; b. 08/2017 s/p DCCV.    Past Surgical History: Past Surgical History:  Procedure Laterality Date   CARDIAC ELECTROPHYSIOLOGY STUDY AND ABLATION  2012   Dr. Viann Shove   CARDIOVERSION N/A 09/12/2017   Procedure: CARDIOVERSION;  Surgeon: Jerline Pain, MD;  Location: [facility] ENDOSCOPY;  Service: Cardiovascular;  Laterality: N/A;   LOOP RECORDER IMPLANT  06/11/2010   TEE WITHOUT CARDIOVERSION N/A 09/12/2017   Procedure: TRANSESOPHAGEAL ECHOCARDIOGRAM (TEE);  Surgeon: Jerline Pain, MD;  Location: [facility] ENDOSCOPY;  Service: Cardiovascular;  Laterality: N/A;    Family History: History reviewed. No pertinent family history.  Social History: Social History   Socioeconomic History   Marital status: Widowed    Spouse name: Not on file   Number of children: Not on file   Years of education: Not on file   Highest education level: Not on file  Occupational History   Not on  file  Tobacco Use   Smoking status: Never   Smokeless tobacco: Never  Vaping Use   Vaping Use: Never used  Substance and Sexual Activity   Alcohol use: Yes    Comment: rare   Drug use: Never   Sexual activity: Not on file  Other Topics Concern   Not on file  Social History Narrative   Not on file   Social Determinants of Health   Financial Resource Strain: Not on file  Food Insecurity: Not on file  Transportation Needs: Not on file  Physical Activity: Not on file  Stress: Not on file  Social Connections: Not on file    Allergies:  Allergies  Allergen Reactions   Pacerone [  Amiodarone] Itching and Rash    Objective:    Vital Signs:   Temp:  [97.8 F (36.6 C)-98.1 F (36.7 C)] 97.8 F (36.6 C) (01/05 0406) Pulse Rate:  [121-142] 121 (01/05 0600) Resp:  [15-37] 24 (01/05 0600) BP: (91-125)/(68-98) 95/77 (01/05 0600) SpO2:  [94 %-100 %] 95 % (01/05 0600) Weight:  [123.8 kg] 123.8 kg (01/04 1900) Last BM Date : 03/25/22  Weight change: Filed Weights   03/24/22 1900  Weight: 123.8 kg    Intake/Output:  No intake or output data in the 24 hours ending 03/25/22 0645    Physical Exam    General:   No resp difficulty HEENT: normal Neck: supple. JVP 7-8  Carotids 2+ bilat; no bruits. No lymphadenopathy or thyromegaly appreciated. Cor: PMI nondisplaced. Tachy Regular rate & rhythm. No rubs, gallops or murmurs. Lungs: clear Abdomen: soft, nontender, nondistended. No hepatosplenomegaly. No bruits or masses. Good bowel sounds. Extremities: no cyanosis, clubbing, rash, compression stockings on with trace edema Neuro: alert & orientedx3, cranial nerves grossly intact. moves all 4 extremities w/o difficulty. Affect pleasant   Telemetry   A flutter 130s   EKG     Aflutter 135 on admit   Labs   Basic Metabolic Panel: Recent Labs  Lab 03/24/22 1144 03/25/22 0510  NA 138 137  K 4.2 3.9  CL 102 100  CO2 28 27  GLUCOSE 104* 101*  BUN 11 12  CREATININE  1.21 1.25*  CALCIUM 9.1 9.0  MG 2.1  --     Liver Function Tests: Recent Labs  Lab 03/24/22 1144  AST 25  ALT 20  ALKPHOS 60  BILITOT 0.7  PROT 6.9  ALBUMIN 3.2*   No results for input(s): "LIPASE", "AMYLASE" in the last 168 hours. No results for input(s): "AMMONIA" in the last 168 hours.  CBC: Recent Labs  Lab 03/24/22 1144  WBC 4.1  HGB 13.4  HCT 39.7  MCV 100.0  PLT 129*    Cardiac Enzymes: No results for input(s): "CKTOTAL", "CKMB", "CKMBINDEX", "TROPONINI" in the last 168 hours.  BNP: BNP (last 3 results) Recent Labs    03/24/22 1144  BNP 287.5*    ProBNP (last 3 results) No results for input(s): "PROBNP" in the last 8760 hours.   CBG: No results for input(s): "GLUCAP" in the last 168 hours.  Coagulation Studies: No results for input(s): "LABPROT", "INR" in the last 72 hours.   Imaging   DG Chest Port 1 View  Result Date: 03/24/2022 CLINICAL DATA:  Weak EXAM: PORTABLE CHEST 1 VIEW COMPARISON:  05/24/2018 FINDINGS: Cardiomegaly. Unchanged right basilar scarring or atelectasis. Left lung is normally aerated. Osseous structures unremarkable. IMPRESSION: Cardiomegaly. Unchanged right basilar scarring or atelectasis. No acute appearing airspace opacity. Electronically Signed   By: Delanna Ahmadi M.D.   On: 03/24/2022 18:12     Medications:     Current Medications:  furosemide  80 mg Intravenous BID   heparin  1,500 Units Intravenous Once   metoprolol tartrate  25 mg Oral Q6H   sacubitril-valsartan  1 tablet Oral BID   sodium chloride flush  3 mL Intravenous Q12H   spironolactone  12.5 mg Oral Daily    Infusions:  sodium chloride     heparin 1,600 Units/hr (03/25/22 0634)      Patient Profile  [name] is a 72 y.o. male with a history of persistent afib (s/p ablation 2012 at [facility], [place] 2019 & 2020, HTN, and chronic systolic HF.   Admitted with  A flutter RVR and volume overload.   Assessment/Plan   1. A flutter RVR Had ablation  2012 and DC-CV 2019 and 2020 - Remains in A flutter.  - Given on lopressor + heparin drip.  -NPO for TEE/DC-CV today   - Switch to eliquis later today.  -Refer back EP for follow up and possible ablation.   2. A/C HFrEF -EF 25% as far back as 2009.  - [place] 2012: normal coronaries. No ICD.  - Echo 09/12/17: EF 15-20%, diffuse HK, mild to mod MR - Echo 10/19  EF 45-50%, diffuse HK, mild MR, LA severely dilated, RA mildly dilated, RV normal - NYHA II. Repeat ECHO in am after cardioversion.  - BNP 287. Mild volume overload. Continue IV lasix.  - Continue lopressor for now and plan to switch to Toprol Xl after cardioversion - Continue entresto  - Hold Spironolactone.  -Renal function stable.    Length of Stay: 1  Amy Clegg, NP  03/25/2022, 6:45 AM  Advanced Heart Failure Team Pager [phone] (M-F; 7a - 5p)  Please contact [facility] for night-coverage after hours (4p -7a ) and weekends on [email or web address]  Patient seen with NP, agree with the above note.   He was sent to ER from clinic yesterday with AFL/RVR (versus atrial tachy).  He was asymptomatic, had just presented for a regular office appointment.  HR in 130s.  He denies dyspnea.    This morning, he remain in atypical atrial flutter vs atrial tachycardia rate 130s. He was started on heparin gtt.   General: NAD Neck: JVP 14-16 cm, no thyromegaly or thyroid nodule.  Lungs: Clear to auscultation bilaterally with normal respiratory effort. CV: Nondisplaced PMI.  Heart tachy, regular S1/S2, no S3/S4, no murmur.  1+ edema to knees.  No carotid bruit.  Normal pedal pulses.  Abdomen: Soft, nontender, no hepatosplenomegaly, no distention.  Skin: Intact without lesions or rashes.  Neurologic: Alert and oriented x 3.  Psych: Normal affect. Extremities: No clubbing or cyanosis.  HEENT: Normal.   Patient had atrial fibrillation ablation in the past but also has history of subsequent atrial flutter. At home, he was not taking Xarelto (had  been on in the past). He does not feel the flutter so is not sure how long he has been in it.  - Continue heparin gtt.  - He has been started on metoprolol 25 q6 hrs, will transition eventually to Toprol XL.  - TEE-guided DCCV today.  - Outpatient followup with EP to consider ablation.   He is volume overloaded on exam today, suspect this was triggered by atypical atrial flutter/AT with RVR.  He denies dyspnea. Last echo showed EF 45-50% in 2019.  - Will get TEE today but would repeat TTE when he is in NSR.  - Lasix 80 mg IV bid, replace K.  - Continue Entresto at 24/26 bid for now.   Loralie Champagne 03/25/2022 7:29 AM

## 2022-03-25 NOTE — ED Notes (Signed)
Medication requested from pharmacy at this time 

## 2022-03-25 NOTE — TOC Initial Note (Signed)
Transition of Care [facility]) - Initial/Assessment Note    Patient Details  Name: [name] MRN: [id number] Date of Birth: 1950/07/27  Transition of Care [facility]) CM/SW Contact:    Erenest Rasher, RN Phone Number: [phone] 03/25/2022, 4:38 PM  Clinical Narrative:                  HF TOC CM spoke to pt and states he still works. He was independent PTA and drives to his appts. Will continue to follow for dc needs.     Expected Discharge Plan: Home/Self Care Barriers to Discharge: Continued Medical Work up   Patient Goals and CMS Choice Patient states their goals for this hospitalization and ongoing recovery are:: wants to recover fully     Expected Discharge Plan and Services   Discharge Planning Services: CM Consult   Living arrangements for the past 2 months: Single Family Home                                      Prior Living Arrangements/Services Living arrangements for the past 2 months: Single Family Home Lives with:: Adult Children Patient language and need for interpreter reviewed:: Yes        Need for Family Participation in Patient Care: No (Comment) Care giver support system in place?: Yes (comment) Current home services: DME Criminal Activity/Legal Involvement Pertinent to Current Situation/Hospitalization: No - Comment as needed  Activities of Daily Living      Permission Sought/Granted Permission sought to share information with : Case Manager, Family Supports, PCP Permission granted to share information with : Yes, Verbal Permission Granted  Share Information with NAME: [name]     Permission granted to share info w Relationship: daughter  Permission granted to share info w Contact Information: [phone]  Emotional Assessment Appearance:: Appears stated age Attitude/Demeanor/Rapport: Engaged Affect (typically observed): Accepting Orientation: : Oriented to Self, Oriented to Place, Oriented to  Time, Oriented to  Situation   Psych Involvement: No (comment)  Admission diagnosis:  Typical atrial flutter (HCC) [I48.3] Acute on chronic HFrEF (heart failure with reduced ejection fraction) ([place]) [I50.23] Patient Active Problem List   Diagnosis Date Noted   Acute on chronic combined systolic and diastolic CHF (congestive heart failure) ([place]) 03/24/2022   Acute on chronic systolic CHF (congestive heart failure) ([place]) 09/14/2017   Atrial flutter with rapid ventricular response (HCC)    Atrial fibrillation ([place]) 07/07/2012   Long term (current) use of anticoagulants 07/07/2012   PCP:  Lucianne Lei, MD Pharmacy:   [facility] [place], [place] [address] [address] Phone: [phone] Fax: [place], [place] [address] Phone: [phone] Fax: [phone]     Social Determinants of Health (SDOH) Social History: SDOH Screenings   Tobacco Use: Low Risk  (03/25/2022)   SDOH Interventions:     Readmission Risk Interventions     No data to display

## 2022-03-25 NOTE — Progress Notes (Signed)
ANTICOAGULATION CONSULT NOTE    Pharmacy Consult for heparin  Indication: atrial fibrillation  Allergies  Allergen Reactions   Pacerone [Amiodarone] Itching and Rash    Patient Measurements: Height: 6\' 4"  (193 cm) Weight: 123.8 kg (272 lb 14.9 oz) IBW/kg (Calculated) : 86.8 Heparin Dosing Weight: 113.1kg   Vital Signs: Temp: 97.8 F (36.6 C) (01/05 0406) Temp Source: Oral (01/05 0406) BP: 95/77 (01/05 0600) Pulse Rate: 121 (01/05 0600)  Labs: Recent Labs    03/24/22 1144 03/24/22 1512 03/25/22 0510  HGB 13.4  --   --   HCT 39.7  --   --   PLT 129*  --   --   HEPARINUNFRC  --   --  0.20*  CREATININE 1.21  --  1.25*  TROPONINIHS  --  15  --      Estimated Creatinine Clearance: 77.9 mL/min (A) (by C-G formula based on SCr of 1.25 mg/dL (H)).   Medical History: Past Medical History:  Diagnosis Date   Atrial tachycardia    Chronic HFrEF (heart failure with reduced ejection fraction) (HCC)    Hypertension    Mitral regurgitation    NICM (nonischemic cardiomyopathy) ([place])    a. 2009 EF 25%; b. 05/2010 Cath: nl cors. EF 45%; c. 12/2017 Echo: EF 45-50%, diff HK, mild MR, sev dil LA.   Paroxysmal atrial flutter ([place])    a. Dx in 05/2018.   Persistent atrial fibrillation ([place])    a. 2012 s/p RFCA; b. 08/2017 s/p DCCV.    Assessment: Patient admitted for afib and fluid overload. Hx of Afib previously on Xarelto but has been off for sometime. Unknown why was stopped. Cardiology consulted, heparin to be used until TEE/DCCV in AM.   Initial heaprin level 0.20 on 1300 units/hr - no issues with infusion overnight and no overt s/sx of bleeding.    Goal of Therapy:  Heparin level 0.3-0.7 units/ml Monitor platelets by anticoagulation protocol: Yes   Plan:  Give 1500 units bolus x 1 Start heparin infusion at 1600 units/hr Check anti-Xa level in 8 hours and daily while on heparin Continue to monitor H&H and platelets  Esmeralda Arthur, PharmD, BCCCP  03/25/2022,6:30  AM

## 2022-03-25 NOTE — Procedures (Addendum)
Electrical Cardioversion Procedure Note RALF KONOPKA [id number] [birth year]  Procedure: Electrical Cardioversion Indications:  Atrial Flutter  Procedure Details Consent: Risks of procedure as well as the alternatives and risks of each were explained to the (patient/caregiver).  Consent for procedure obtained. Time Out: Verified patient identification, verified procedure, site/side was marked, verified correct patient position, special equipment/implants available, medications/allergies/relevent history reviewed, required imaging and test results available.  Performed  Patient placed on cardiac monitor, pulse oximetry, supplemental oxygen as necessary.  Sedation given:  Propofol per anesthesiology Pacer pads placed anterior and posterior chest.  Cardioverted 1 time(s).  Cardioverted at [place].  Evaluation Findings: Post procedure EKG shows: NSR Complications: None Patient did tolerate procedure well.   Loralie Champagne 03/25/2022, 9:26 AM

## 2022-03-26 ENCOUNTER — Inpatient Hospital Stay (HOSPITAL_COMMUNITY): Payer: 59

## 2022-03-26 DIAGNOSIS — I5043 Acute on chronic combined systolic (congestive) and diastolic (congestive) heart failure: Secondary | ICD-10-CM | POA: Diagnosis not present

## 2022-03-26 LAB — ECHOCARDIOGRAM COMPLETE
AR max vel: 3.43 cm2
AV Area VTI: 3.45 cm2
AV Area mean vel: 3.22 cm2
AV Mean grad: 2 mmHg
AV Peak grad: 4.7 mmHg
Ao pk vel: 1.08 m/s
Calc EF: 26.6 %
Height: 76 in
MV M vel: 3.98 m/s
MV Peak grad: 63.4 mmHg
MV VTI: 1.37 cm2
Radius: 0.9 cm
S' Lateral: 5.8 cm
Single Plane A2C EF: 26.1 %
Single Plane A4C EF: 25.8 %
Weight: 4162.28 oz

## 2022-03-26 LAB — CBC
HCT: 38 % — ABNORMAL LOW (ref 39.0–52.0)
Hemoglobin: 12.5 g/dL — ABNORMAL LOW (ref 13.0–17.0)
MCH: 31.9 pg (ref 26.0–34.0)
MCHC: 32.9 g/dL (ref 30.0–36.0)
MCV: 96.9 fL (ref 80.0–100.0)
Platelets: 108 10*3/uL — ABNORMAL LOW (ref 150–400)
RBC: 3.92 MIL/uL — ABNORMAL LOW (ref 4.22–5.81)
RDW: 13.7 % (ref 11.5–15.5)
WBC: 4.6 10*3/uL (ref 4.0–10.5)
nRBC: 0 % (ref 0.0–0.2)

## 2022-03-26 LAB — HEPARIN LEVEL (UNFRACTIONATED): Heparin Unfractionated: 0.58 IU/mL (ref 0.30–0.70)

## 2022-03-26 LAB — BASIC METABOLIC PANEL
Anion gap: 8 (ref 5–15)
BUN: 13 mg/dL (ref 8–23)
CO2: 27 mmol/L (ref 22–32)
Calcium: 8.9 mg/dL (ref 8.9–10.3)
Chloride: 102 mmol/L (ref 98–111)
Creatinine, Ser: 1.38 mg/dL — ABNORMAL HIGH (ref 0.61–1.24)
GFR, Estimated: 55 mL/min — ABNORMAL LOW (ref >60)
Glucose, Bld: 139 mg/dL — ABNORMAL HIGH (ref 70–99)
Potassium: 3.4 mmol/L — ABNORMAL LOW (ref 3.5–5.1)
Sodium: 137 mmol/L (ref 135–145)

## 2022-03-26 LAB — COOXEMETRY PANEL
Carboxyhemoglobin: 1.9 % — ABNORMAL HIGH (ref 0.5–1.5)
Methemoglobin: <0.7 % (ref 0.0–1.5)
O2 Saturation: 68.6 %
Total hemoglobin: 12.5 g/dL (ref 12.0–16.0)

## 2022-03-26 MED ORDER — SODIUM CHLORIDE 0.9% FLUSH: 10.0000 mL | INTRAVENOUS | Status: DC | PRN

## 2022-03-26 MED ORDER — SPIRONOLACTONE 25 MG PO TABS
25.0000 mg | ORAL_TABLET | Freq: Every day | ORAL | Status: DC
  Administered 2022-03-26 – 2022-03-29 (×4): 25 mg via ORAL
  Filled 2022-03-26 (×4): qty 1

## 2022-03-26 MED ORDER — SODIUM CHLORIDE 0.9% FLUSH
10.0000 mL | Freq: Two times a day (BID) | INTRAVENOUS | Status: DC
  Administered 2022-03-26 – 2022-03-29 (×6): 10 mL

## 2022-03-26 MED ORDER — DAPAGLIFLOZIN PROPANEDIOL 10 MG PO TABS
10.0000 mg | ORAL_TABLET | Freq: Every day | ORAL | Status: DC
  Administered 2022-03-26 – 2022-03-29 (×4): 10 mg via ORAL
  Filled 2022-03-26 (×4): qty 1

## 2022-03-26 MED ORDER — NOREPINEPHRINE 16 MG/250ML-% IV SOLN
3.0000 ug/min | INTRAVENOUS | Status: DC
  Administered 2022-03-26: 3 ug/min via INTRAVENOUS
  Filled 2022-03-26: qty 250

## 2022-03-26 MED ORDER — POTASSIUM CHLORIDE CRYS ER 20 MEQ PO TBCR
40.0000 meq | EXTENDED_RELEASE_TABLET | Freq: Once | ORAL | Status: AC
  Administered 2022-03-26: 40 meq via ORAL
  Filled 2022-03-26: qty 2

## 2022-03-26 NOTE — Progress Notes (Signed)
Peripherally Inserted Central Catheter Placement  The IV Nurse has discussed with the patient and/or persons authorized to consent for the patient, the purpose of this procedure and the potential benefits and risks involved with this procedure.  The benefits include less needle sticks, lab draws from the catheter, and the patient may be discharged home with the catheter. Risks include, but not limited to, infection, bleeding, blood clot (thrombus formation), and puncture of an artery; nerve damage and irregular heartbeat and possibility to perform a PICC exchange if needed/ordered by physician.  Alternatives to this procedure were also discussed.  Bard Power PICC patient education guide, fact sheet on infection prevention and patient information card has been provided to patient /or left at bedside.    PICC Placement Documentation  PICC Double Lumen 83/38/25 Right Basilic 40 cm 0 cm (Active)  Indication for Insertion or Continuance of Line Vasoactive infusions;Prolonged intravenous therapies;Chronic illness with exacerbations (CF, Sickle Cell, etc.) 03/26/22 0818  Exposed Catheter (cm) 0 cm 03/26/22 0818  Site Assessment Clean, Dry, Intact 03/26/22 0818  Lumen #1 Status Flushed;Saline locked;Blood return noted 03/26/22 0818  Lumen #2 Status Flushed;Saline locked;Blood return noted 03/26/22 0818  Dressing Type Transparent;Securing device 03/26/22 0818  Dressing Status Antimicrobial disc in place;Clean, Dry, Intact 03/26/22 0818  Safety Lock Not Applicable 05/39/76 7341  Line Care Connections checked and tightened 03/26/22 0818  Line Adjustment (NICU/IV Team Only) No 03/26/22 0818  Dressing Intervention New dressing 03/26/22 0818  Dressing Change Due 04/02/22 03/26/22 0818       Rolena Infante 03/26/2022, 8:19 AM

## 2022-03-26 NOTE — Progress Notes (Signed)
ANTICOAGULATION CONSULT NOTE    Pharmacy Consult for heparin  Indication: atrial fibrillation  Allergies  Allergen Reactions   Pacerone [Amiodarone] Itching and Rash    Patient Measurements: Height: 6\' 4"  (193 cm) Weight: 118 kg (260 lb 2.3 oz) IBW/kg (Calculated) : 86.8 Heparin Dosing Weight: 113.1kg   Vital Signs: Temp: 97.7 F (36.5 C) (01/06 0730) Temp Source: Oral (01/06 0730) BP: 82/53 (01/06 0800) Pulse Rate: 68 (01/06 0800)  Labs: Recent Labs    03/24/22 1144 03/24/22 1512 03/25/22 0510 03/25/22 1325 03/26/22 0727  HGB 13.4  --  13.8  --  12.5*  HCT 39.7  --  42.6  --  38.0*  PLT 129*  --  117*  --  108*  LABPROT  --   --   --  15.3*  --   INR  --   --   --  1.2  --   HEPARINUNFRC  --   --  0.20* 0.46 0.58  CREATININE 1.21  --  1.25*  --  1.38*  TROPONINIHS  --  15  --   --   --      Estimated Creatinine Clearance: 69 mL/min (A) (by C-G formula based on SCr of 1.38 mg/dL (H)).   Medical History: Past Medical History:  Diagnosis Date   Atrial tachycardia    Chronic HFrEF (heart failure with reduced ejection fraction) (HCC)    Hypertension    Mitral regurgitation    NICM (nonischemic cardiomyopathy) ([place])    a. 2009 EF 25%; b. 05/2010 Cath: nl cors. EF 45%; c. 12/2017 Echo: EF 45-50%, diff HK, mild MR, sev dil LA.   Paroxysmal atrial flutter ([place])    a. Dx in 05/2018.   Persistent atrial fibrillation ([place])    a. 2012 s/p RFCA; b. 08/2017 s/p DCCV.    Assessment: Patient admitted for afib and fluid overload. Hx of Afib previously on Xarelto but has been off for sometime. Unknown why was stopped. S/p TEE/DCCV 1/5. Will aim to stay on heparin x 2-3 days during vulnerable period post-DCCV, then transition to [facility]. Remains on low dose Levo.  Heparin level this AM 0.58 - at goal.  No overt bleeding or complications noted.  Hgb stable, platelets down to 108K.   Goal of Therapy:  Heparin level 0.3-0.7 units/ml Monitor platelets by anticoagulation  protocol: Yes   Plan:  Continue IV heparin at 1600 units/hr Daily heparin level and CBC. Continue to monitor H&H and platelets  Thank you for allowing pharmacy to participate in this patient's care.  Reatha Harps, PharmD PGY2 Pharmacy Resident 03/26/2022 8:53 AM Check [email or web address] for unit specific pharmacy number

## 2022-03-26 NOTE — Progress Notes (Signed)
Echo attempted. Patient getting PICC line. Will attempt again as schedule permits.   [name] 03/26/2022, 8:16 AM

## 2022-03-26 NOTE — Progress Notes (Signed)
  Echocardiogram 2D Echocardiogram has been performed.  [name] 03/26/2022, 9:34 AM

## 2022-03-26 NOTE — Progress Notes (Signed)
Advanced Heart Failure Team Consult Note   Primary Physician: Renaye Rakers, MD PCP-Cardiologist:  Arvilla Meres, MD  Reason for Consultation: A/C HFrEF  Subjective / Interval hx  - 1200cc urine output over the past 24h - Reports significant improvement in dyspnea and overall congestion.  - PICC line placed this AM.   Objective:    Vital Signs:   Temp:  [97.6 F (36.4 C)-98.5 F (36.9 C)] 97.7 F (36.5 C) (01/06 0730) Pulse Rate:  [57-100] 68 (01/06 0800) Resp:  [0-29] 16 (01/06 0800) BP: (73-124)/(53-84) 82/53 (01/06 0800) SpO2:  [91 %-99 %] 91 % (01/06 0800) Weight:  [258 kg] 118 kg (01/06 0430) Last BM Date : 03/25/22  Weight change: Filed Weights   03/24/22 1900 03/25/22 0819 03/26/22 0430  Weight: 123.8 kg 123.8 kg 118 kg    Intake/Output:   Intake/Output Summary (Last 24 hours) at 03/26/2022 0841 Last data filed at 03/26/2022 0700 Gross per 24 hour  Intake 2510.66 ml  Output 1200 ml  Net 1310.66 ml      Physical Exam    General:   No resp difficulty HEENT: normal Neck: supple. JVP 9 Carotids 2+ bilat; no bruits. No lymphadenopathy or thyromegaly appreciated. Cor: PMI nondisplaced. Tachy Regular rate & rhythm. No rubs, gallops or murmurs. Lungs: clear Abdomen: soft, nontender, nondistended. No hepatosplenomegaly. No bruits or masses. Good bowel sounds. Extremities: no cyanosis, clubbing, rash, compression stockings on with trace edema Neuro: alert & orientedx3, cranial nerves grossly intact. moves all 4 extremities w/o difficulty. Affect pleasant   Telemetry   NSR  EKG     Aflutter 135 on admit   Labs   Basic Metabolic Panel: Recent Labs  Lab 03/24/22 1144 03/25/22 0510 03/26/22 0727  NA 138 137 137  K 4.2 3.9 3.4*  CL 102 100 102  CO2 28 27 27   GLUCOSE 104* 101* 139*  BUN 11 12 13   CREATININE 1.21 1.25* 1.38*  CALCIUM 9.1 9.0 8.9  MG 2.1  --   --      Liver Function Tests: Recent Labs  Lab 03/24/22 1144  AST 25  ALT 20   ALKPHOS 60  BILITOT 0.7  PROT 6.9  ALBUMIN 3.2*    No results for input(s): "LIPASE", "AMYLASE" in the last 168 hours. No results for input(s): "AMMONIA" in the last 168 hours.  CBC: Recent Labs  Lab 03/24/22 1144 03/25/22 0510 03/26/22 0727  WBC 4.1 4.7 4.6  NEUTROABS  --  1.9  --   HGB 13.4 13.8 12.5*  HCT 39.7 42.6 38.0*  MCV 100.0 97.3 96.9  PLT 129* 117* 108*     BNP: BNP (last 3 results) Recent Labs    03/24/22 1144  BNP 287.5*     Coagulation Studies: Recent Labs    03/25/22 1325  LABPROT 15.3*  INR 1.2     Imaging   05/23/22 EKG SITE RITE  Result Date: 03/25/2022 If Site Rite image not attached, placement could not be confirmed due to current cardiac rhythm.    Medications:     Current Medications:  Chlorhexidine Gluconate Cloth  6 each Topical Daily   digoxin  0.125 mg Oral Daily   furosemide  80 mg Intravenous BID   sodium chloride flush  10-40 mL Intracatheter Q12H   sodium chloride flush  3 mL Intravenous Q12H   spironolactone  25 mg Oral Daily    Infusions:  sodium chloride 10 mL/hr at 03/26/22 0700   amiodarone 30 mg/hr (03/26/22  0700)   heparin 1,600 Units/hr (03/26/22 0700)   norepinephrine (LEVOPHED) Adult infusion 2 mcg/min (03/26/22 0700)      Patient Profile  [name] is a 72 y.o. male with a history of persistent afib (s/p ablation 2012 at [facility], [place] 2019 & 2020, HTN, and chronic systolic HF.   Admitted with A flutter RVR and volume overload.   Assessment/Plan   1. A flutter RVR Had ablation 2012 and DC-CV 2019 and 2020 - S/P TEE DCCV yesterday; in normal sinus rhythm this AM with frequent PACs on amiodarone 30mg /hr gtt.  - Continue amiodarone - digoxin 0.181mcg  2. A/C HFrEF -EF 25% as far back as 2009.  - [place] 2012: normal coronaries. No ICD.  - Echo 09/12/17: EF 15-20%, diffuse HK, mild to mod MR - Echo 10/19  EF 45-50%, diffuse HK, mild MR, LA severely dilated, RA mildly dilated, RV normal - TEE yesterday  w/ severely reduced LV function; cardioverted to NSR.  - Today on levophed 65mcg with SBP 115. Plan to wean off.  - PICC line placed; mixed venous & CVP pending; on exam JVP appears to be 8-9cm, will continue IV lasix 80mg  this AM, D/C PM dose.  - Start farxiga 10mg  & spironolactone 25mg .    Length of Stay: 2  Destiny Hagin, DO  03/26/2022, 8:41 AM  Advanced Heart Failure Team Pager [phone] (M-F; 7a - 5p)  Please contact [facility] for night-coverage after hours (4p -7a ) and weekends on [email or web address]  CRITICAL CARE Performed by: Hebert Soho   Total critical care time: 35 minutes  Critical care time was exclusive of separately billable procedures and treating other patients.  Critical care was necessary to treat or prevent imminent or life-threatening deterioration.  Critical care was time spent personally by me on the following activities: development of treatment plan with patient and/or surrogate as well as nursing, discussions with consultants, evaluation of patient's response to treatment, examination of patient, obtaining history from patient or surrogate, ordering and performing treatments and interventions, ordering and review of laboratory studies, ordering and review of radiographic studies, pulse oximetry and re-evaluation of patient's condition.

## 2022-03-27 ENCOUNTER — Encounter (HOSPITAL_COMMUNITY): Payer: Self-pay | Admitting: Cardiology

## 2022-03-27 DIAGNOSIS — I5043 Acute on chronic combined systolic (congestive) and diastolic (congestive) heart failure: Secondary | ICD-10-CM | POA: Diagnosis not present

## 2022-03-27 LAB — BASIC METABOLIC PANEL
Anion gap: 7 (ref 5–15)
BUN: 14 mg/dL (ref 8–23)
CO2: 30 mmol/L (ref 22–32)
Calcium: 9.3 mg/dL (ref 8.9–10.3)
Chloride: 100 mmol/L (ref 98–111)
Creatinine, Ser: 1.44 mg/dL — ABNORMAL HIGH (ref 0.61–1.24)
GFR, Estimated: 52 mL/min — ABNORMAL LOW (ref >60)
Glucose, Bld: 87 mg/dL (ref 70–99)
Potassium: 3.3 mmol/L — ABNORMAL LOW (ref 3.5–5.1)
Sodium: 137 mmol/L (ref 135–145)

## 2022-03-27 LAB — CBC
HCT: 39 % (ref 39.0–52.0)
Hemoglobin: 12.5 g/dL — ABNORMAL LOW (ref 13.0–17.0)
MCH: 31.5 pg (ref 26.0–34.0)
MCHC: 32.1 g/dL (ref 30.0–36.0)
MCV: 98.2 fL (ref 80.0–100.0)
Platelets: 105 10*3/uL — ABNORMAL LOW (ref 150–400)
RBC: 3.97 MIL/uL — ABNORMAL LOW (ref 4.22–5.81)
RDW: 13.7 % (ref 11.5–15.5)
WBC: 5.3 10*3/uL (ref 4.0–10.5)
nRBC: 0 % (ref 0.0–0.2)

## 2022-03-27 LAB — COOXEMETRY PANEL
Carboxyhemoglobin: 2 % — ABNORMAL HIGH (ref 0.5–1.5)
Methemoglobin: <0.7 % (ref 0.0–1.5)
O2 Saturation: 64.9 %
Total hemoglobin: 12.8 g/dL (ref 12.0–16.0)

## 2022-03-27 LAB — HEPARIN LEVEL (UNFRACTIONATED): Heparin Unfractionated: 0.62 IU/mL (ref 0.30–0.70)

## 2022-03-27 MED ORDER — MAGNESIUM HYDROXIDE 400 MG/5ML PO SUSP
30.0000 mL | Freq: Every day | ORAL | Status: DC | PRN
  Administered 2022-03-27: 30 mL via ORAL
  Filled 2022-03-27 (×2): qty 30

## 2022-03-27 MED ORDER — POTASSIUM CHLORIDE CRYS ER 20 MEQ PO TBCR
40.0000 meq | EXTENDED_RELEASE_TABLET | Freq: Once | ORAL | Status: AC
  Administered 2022-03-27: 40 meq via ORAL
  Filled 2022-03-27: qty 2

## 2022-03-27 MED ORDER — POTASSIUM CHLORIDE CRYS ER 20 MEQ PO TBCR
60.0000 meq | EXTENDED_RELEASE_TABLET | Freq: Once | ORAL | Status: AC
  Administered 2022-03-27: 60 meq via ORAL
  Filled 2022-03-27: qty 3

## 2022-03-27 MED ORDER — POTASSIUM CHLORIDE CRYS ER 20 MEQ PO TBCR: 20.0000 meq | EXTENDED_RELEASE_TABLET | Freq: Once | ORAL | Status: DC

## 2022-03-27 NOTE — Progress Notes (Signed)
Advanced Heart Failure Team Consult Note   Primary Physician: Renaye Rakers, MD PCP-Cardiologist:  Arvilla Meres, MD  Reason for Consultation: A/C HFrEF  Subjective / Interval hx  - 6.2L urine output over the past 24h.  - CVP 14-15 this AM  Objective:    Vital Signs:   Temp:  [97.7 F (36.5 C)-98.1 F (36.7 C)] 98.1 F (36.7 C) (01/07 0730) Pulse Rate:  [62-113] 69 (01/07 0900) Resp:  [10-25] 16 (01/07 0900) BP: (82-150)/(48-129) 94/51 (01/07 0900) SpO2:  [92 %-97 %] 93 % (01/07 0900) Weight:  [113.6 kg] 113.6 kg (01/07 0530) Last BM Date : 03/25/22  Weight change: Filed Weights   03/25/22 0819 03/26/22 0430 03/27/22 0530  Weight: 123.8 kg 118 kg 113.6 kg    Intake/Output:   Intake/Output Summary (Last 24 hours) at 03/27/2022 1027 Last data filed at 03/27/2022 0742 Gross per 24 hour  Intake 1939.5 ml  Output 6225 ml  Net -4285.5 ml       Physical Exam  CVP: 14-15 General:   No resp difficulty HEENT: normal Neck: supple. JVP to midneck.  Carotids 2+ bilat; no bruits. No lymphadenopathy or thyromegaly appreciated. Cor: PMI nondisplaced. Tachy Regular rate & rhythm. No rubs, gallops or murmurs. Lungs: decreased lung sounds b/l Abdomen: soft, nontender, nondistended. No hepatosplenomegaly. No bruits or masses. Good bowel sounds. Extremities: no cyanosis, clubbing, rash, compression stockings on with trace edema Neuro: alert & orientedx3, cranial nerves grossly intact. moves all 4 extremities w/o difficulty. Affect pleasant   Telemetry   NSR  EKG     Aflutter 135 on admit   Labs   Basic Metabolic Panel: Recent Labs  Lab 03/24/22 1144 03/25/22 0510 03/26/22 0727 03/27/22 0529  NA 138 137 137 137  K 4.2 3.9 3.4* 3.3*  CL 102 100 102 100  CO2 28 27 27 30   GLUCOSE 104* 101* 139* 87  BUN 11 12 13 14   CREATININE 1.21 1.25* 1.38* 1.44*  CALCIUM 9.1 9.0 8.9 9.3  MG 2.1  --   --   --      Liver Function Tests: Recent Labs  Lab 03/24/22 1144   AST 25  ALT 20  ALKPHOS 60  BILITOT 0.7  PROT 6.9  ALBUMIN 3.2*    No results for input(s): "LIPASE", "AMYLASE" in the last 168 hours. No results for input(s): "AMMONIA" in the last 168 hours.  CBC: Recent Labs  Lab 03/24/22 1144 03/25/22 0510 03/26/22 0727 03/27/22 0529  WBC 4.1 4.7 4.6 5.3  NEUTROABS  --  1.9  --   --   HGB 13.4 13.8 12.5* 12.5*  HCT 39.7 42.6 38.0* 39.0  MCV 100.0 97.3 96.9 98.2  PLT 129* 117* 108* 105*     BNP: BNP (last 3 results) Recent Labs    03/24/22 1144  BNP 287.5*     Coagulation Studies: Recent Labs    03/25/22 1325  LABPROT 15.3*  INR 1.2      Imaging   No results found.   Medications:     Current Medications:  Chlorhexidine Gluconate Cloth  6 each Topical Daily   dapagliflozin propanediol  10 mg Oral Daily   digoxin  0.125 mg Oral Daily   furosemide  80 mg Intravenous BID   potassium chloride  20 mEq Oral Once   potassium chloride  40 mEq Oral Once   sodium chloride flush  10-40 mL Intracatheter Q12H   sodium chloride flush  3 mL Intravenous Q12H  spironolactone  25 mg Oral Daily    Infusions:  sodium chloride 10 mL/hr at 03/27/22 0600   amiodarone 30 mg/hr (03/27/22 0950)   heparin 1,600 Units/hr (03/27/22 0600)   norepinephrine (LEVOPHED) Adult infusion Stopped (03/27/22 0333)      Patient Profile  [name] is a 72 y.o. male with a history of persistent afib (s/p ablation 2012 at [facility], [place] 2019 & 2020, HTN, and chronic systolic HF.   Admitted with A flutter RVR and volume overload.   Assessment/Plan   1. A flutter RVR Had ablation 2012 and DC-CV 2019 and 2020 - S/P TEE DCCV yesterday; in normal sinus rhythm this AM with frequent PACs on amiodarone 30mg /hr gtt.  - Continue amiodarone - digoxin 0.163mcg  2. A/C HFrEF -EF 25% as far back as 2009.  - [place] 2012: normal coronaries. No ICD.  - Echo 09/12/17: EF 15-20%, diffuse HK, mild to mod MR - Echo 10/19  EF 45-50%, diffuse HK, mild MR,  LA severely dilated, RA mildly dilated, RV normal - TEE  w/ severely reduced LV function; cardioverted to NSR.  - Levophed weaned off.  - Started on farxiga/spiro yesterday; 6.2L urine output q24 with IV lasix & metolazone.  - CVP 14-15 today, continue IV lasix 80mg  BID. Transfer to floor.    Length of Stay: [place], DO  03/27/2022, 10:27 AM  Advanced Heart Failure Team Pager [phone] (M-F; 7a - 5p)  Please contact [facility] for night-coverage after hours (4p -7a ) and weekends on [email or web address]  CRITICAL CARE Performed by: Hebert Soho   Total critical care time: 30 minutes  Critical care time was exclusive of separately billable procedures and treating other patients.  Critical care was necessary to treat or prevent imminent or life-threatening deterioration.  Critical care was time spent personally by me on the following activities: development of treatment plan with patient and/or surrogate as well as nursing, discussions with consultants, evaluation of patient's response to treatment, examination of patient, obtaining history from patient or surrogate, ordering and performing treatments and interventions, ordering and review of laboratory studies, ordering and review of radiographic studies, pulse oximetry and re-evaluation of patient's condition.

## 2022-03-27 NOTE — Progress Notes (Signed)
ANTICOAGULATION CONSULT NOTE    Pharmacy Consult for heparin  Indication: atrial fibrillation  Allergies  Allergen Reactions   Pacerone [Amiodarone] Itching and Rash    Patient Measurements: Height: 6\' 4"  (193 cm) Weight: 113.6 kg (250 lb 7.1 oz) IBW/kg (Calculated) : 86.8 Heparin Dosing Weight: 113.1kg   Vital Signs: Temp: 98.1 F (36.7 C) (01/07 0730) Temp Source: Oral (01/07 0730) BP: 94/51 (01/07 0900) Pulse Rate: 69 (01/07 0900)  Labs: Recent Labs    03/24/22 1144 03/24/22 1512 03/25/22 0510 03/25/22 1325 03/26/22 0727 03/27/22 0529  HGB  --   --  13.8  --  12.5* 12.5*  HCT  --   --  42.6  --  38.0* 39.0  PLT  --   --  117*  --  108* 105*  LABPROT  --   --   --  15.3*  --   --   INR  --   --   --  1.2  --   --   HEPARINUNFRC   < >  --  0.20* 0.46 0.58 0.62  CREATININE  --   --  1.25*  --  1.38* 1.44*  TROPONINIHS  --  15  --   --   --   --    < > = values in this interval not displayed.     Estimated Creatinine Clearance: 64.9 mL/min (A) (by C-G formula based on SCr of 1.44 mg/dL (H)).   Medical History: Past Medical History:  Diagnosis Date   Atrial tachycardia    Chronic HFrEF (heart failure with reduced ejection fraction) (HCC)    Hypertension    Mitral regurgitation    NICM (nonischemic cardiomyopathy) ([place])    a. 2009 EF 25%; b. 05/2010 Cath: nl cors. EF 45%; c. 12/2017 Echo: EF 45-50%, diff HK, mild MR, sev dil LA.   Paroxysmal atrial flutter ([place])    a. Dx in 05/2018.   Persistent atrial fibrillation ([place])    a. 2012 s/p RFCA; b. 08/2017 s/p DCCV.    Assessment: Patient admitted for afib and fluid overload. Hx of Afib previously on Xarelto but has been off for sometime. Unknown why was stopped. S/p TEE/DCCV 1/5. Will aim to stay on heparin x 2-3 days during vulnerable period post-DCCV, then transition to [facility].   Heparin level this AM 0.62 - at goal.  No overt bleeding or complications noted.  Hgb stable at 12.5, platelets down to 105K. Will  continue at current rate today, may need slight reduction in AM if no transition to DOAC.  Goal of Therapy:  Heparin level 0.3-0.7 units/ml Monitor platelets by anticoagulation protocol: Yes   Plan:  Continue IV heparin at 1600 units/hr Daily heparin level and CBC. Continue to monitor H&H and platelets  Thank you for allowing pharmacy to participate in this patient's care.  Reatha Harps, PharmD PGY2 Pharmacy Resident 03/27/2022 10:31 AM Check [email or web address] for unit specific pharmacy number

## 2022-03-28 ENCOUNTER — Other Ambulatory Visit (HOSPITAL_COMMUNITY): Payer: Self-pay

## 2022-03-28 ENCOUNTER — Encounter (HOSPITAL_COMMUNITY): Payer: Self-pay | Admitting: Cardiology

## 2022-03-28 DIAGNOSIS — I5043 Acute on chronic combined systolic (congestive) and diastolic (congestive) heart failure: Secondary | ICD-10-CM | POA: Diagnosis not present

## 2022-03-28 DIAGNOSIS — I4892 Unspecified atrial flutter: Secondary | ICD-10-CM | POA: Diagnosis not present

## 2022-03-28 LAB — HEPARIN LEVEL (UNFRACTIONATED): Heparin Unfractionated: 0.65 IU/mL (ref 0.30–0.70)

## 2022-03-28 LAB — BASIC METABOLIC PANEL
Anion gap: 9 (ref 5–15)
BUN: 15 mg/dL (ref 8–23)
CO2: 31 mmol/L (ref 22–32)
Calcium: 8.9 mg/dL (ref 8.9–10.3)
Chloride: 95 mmol/L — ABNORMAL LOW (ref 98–111)
Creatinine, Ser: 1.31 mg/dL — ABNORMAL HIGH (ref 0.61–1.24)
GFR, Estimated: 58 mL/min — ABNORMAL LOW (ref >60)
Glucose, Bld: 109 mg/dL — ABNORMAL HIGH (ref 70–99)
Potassium: 3.9 mmol/L (ref 3.5–5.1)
Sodium: 135 mmol/L (ref 135–145)

## 2022-03-28 LAB — COOXEMETRY PANEL
Carboxyhemoglobin: 2 % — ABNORMAL HIGH (ref 0.5–1.5)
Methemoglobin: <0.7 % (ref 0.0–1.5)
O2 Saturation: 72.1 %
Total hemoglobin: 13.5 g/dL (ref 12.0–16.0)

## 2022-03-28 MED ORDER — METOPROLOL SUCCINATE ER 25 MG PO TB24
25.0000 mg | ORAL_TABLET | Freq: Every day | ORAL | Status: DC
  Administered 2022-03-28 – 2022-03-29 (×2): 25 mg via ORAL
  Filled 2022-03-28 (×2): qty 1

## 2022-03-28 MED ORDER — RIVAROXABAN 20 MG PO TABS
20.0000 mg | ORAL_TABLET | Freq: Every day | ORAL | Status: DC
  Administered 2022-03-28: 20 mg via ORAL
  Filled 2022-03-28 (×2): qty 1

## 2022-03-28 MED ORDER — HYDROCORTISONE 0.5 % EX CREA
TOPICAL_CREAM | Freq: Three times a day (TID) | CUTANEOUS | Status: AC
  Administered 2022-03-29: 1 via TOPICAL
  Filled 2022-03-28: qty 28.35

## 2022-03-28 MED ORDER — SACUBITRIL-VALSARTAN 24-26 MG PO TABS
1.0000 | ORAL_TABLET | Freq: Two times a day (BID) | ORAL | Status: DC
  Administered 2022-03-28 – 2022-03-29 (×3): 1 via ORAL
  Filled 2022-03-28 (×4): qty 1

## 2022-03-28 NOTE — Discharge Summary (Signed)
Advanced Heart Failure Team  Discharge Summary   Patient ID: [name] MRN: [id number], DOB/AGE: Nov 02, 1950 72 y.o. Admit date: 03/24/2022 D/C date:     03/29/2022   Primary Discharge Diagnoses:  Acute on Chronic Systolic Heart Failure Atrial Flutter w/ RVR s/p DCCV Severe Mitral Regurgitation (Likely Functional) Hypotension     Hospital Course:   [name] is a 72 y.o. male with a history of persistent afib (s/p ablation 2012, DCCV 09/12/17), HTN, and chronic systolic HF.   EF 25% as far back as 2009. [place] 2012: normal coronaries. No ICD. Echo 09/12/17: EF 15-20%, diffuse HK, mild to mod MR. Echo 10/19  EF 45-50%, diffuse HK, mild MR, LA severely dilated, RA mildly dilated, RV normal. Unfortunately was lost to f/u for a period of time in the AHF and EP clinics.   Seen in [facility] 1/4 to re-established care. Noted feeling bad. Was in atrial flutter w/ RVR of unknown duration and volume overloaded. Pt reported he had not been compliant w/ Xarelto. He was sent to ED for admission. On 1/5 underwent TEE/DCCV. TEE showed reduced LVEF 20-25% w/ diffuse HK, RV mildly dilated w/ moderate systolic dysfunction, severe BAE w/ severe central mitral regurgitation, no LA thrombus. Underwent successful DCCV back to NSR. Post TEE, he became hypotensive requiring initiation of pressor support w/ NE. Amio gtt started to keep in NSR (failed to tolerate amio in the past, previously discontinued due to itching). Admitted to ICU. PICC was placed for monitoring of co-ox and CVPs. Initial Co-ox 68% on NE. Diuresed w/ IV Lasix and able to wean off NE and tolerate restart of GDMT. Transitioned back to PO diuretics. EP was consulted to weigh in regarding possible flutter ablation as he had been unable in the past to tolerate amiodarone due to itching. Hospital EP team recommend following up as outpatient w/ Dr. Curt Bears to further discuss ablation.   On 1/9, pt was last seen and examined by Dr. Aundra Dubin and felt stable  for d/c home.   Meds for home: Xarelto 20 mg daily, torsemide 20 mg daily, spironolactone 25 mg daily, Farxiga 10, Entresto 24/26 bid, digoxin 0.125, Toprol XL 25. Meds delivered by [facility] clinic.    Post hospital f/u in the [facility] has been arranged. He will need f/u BMP and dig level checked at visit.   Appt made w/ Dr. Curt Bears on 2/1 to discuss ablation.    Discharge Weight Range: 243 lb  Discharge Vitals: Blood pressure (!) 112/58, pulse 73, temperature 97.7 F (36.5 C), temperature source Oral, resp. rate 18, height 6\' 4"  (1.93 m), weight 110.6 kg, SpO2 93 %.  Labs: Lab Results  Component Value Date   WBC 5.3 03/27/2022   HGB 12.5 (L) 03/27/2022   HCT 39.0 03/27/2022   MCV 98.2 03/27/2022   PLT 105 (L) 03/27/2022    Recent Labs  Lab 03/24/22 1144 03/25/22 0510 03/29/22 0423  NA 138   < > 137  K 4.2   < > 4.2  CL 102   < > 96*  CO2 28   < > 31  BUN 11   < > 18  CREATININE 1.21   < > 1.29*  CALCIUM 9.1   < > 9.7  PROT 6.9  --   --   BILITOT 0.7  --   --   ALKPHOS 60  --   --   ALT 20  --   --   AST 25  --   --  GLUCOSE 104*   < > 83   < > = values in this interval not displayed.   Lab Results  Component Value Date   CHOL  09/16/2008    138        ATP III CLASSIFICATION:  <200     mg/dL   Desirable  160-109  mg/dL   Borderline High  >=323    mg/dL   High          HDL 27 (L) 09/16/2008   LDLCALC (H) 09/16/2008    104        Total Cholesterol/HDL:CHD Risk Coronary Heart Disease Risk Table                     Men   Women  1/2 Average Risk   3.4   3.3  Average Risk       5.0   4.4  2 X Average Risk   9.6   7.1  3 X Average Risk  23.4   11.0        Use the calculated Patient Ratio above and the CHD Risk Table to determine the patient's CHD Risk.        ATP III CLASSIFICATION (LDL):  <100     mg/dL   Optimal  557-322  mg/dL   Near or Above                    Optimal  130-159  mg/dL   Borderline  025-427  mg/dL   High  >062     mg/dL   Very High   TRIG  36 09/16/2008   BNP (last 3 results) Recent Labs    03/24/22 1144  BNP 287.5*    ProBNP (last 3 results) No results for input(s): "PROBNP" in the last 8760 hours.   Diagnostic Studies/Procedures   2D Echo 03/25/21 1. Left ventricular ejection fraction, by estimation, is 25 to 30%. The  left ventricle has severely decreased function. The left ventricle has no  regional wall motion abnormalities. There is moderate asymmetric left  ventricular hypertrophy of the  lateral segment. Left ventricular diastolic function could not be  evaluated. There is the interventricular septum is flattened in systole  and diastole, consistent with right ventricular pressure and volume  overload.   2. Right ventricular systolic function is moderately reduced. The right  ventricular size is moderately enlarged.   3. Left atrial size was severely dilated.   4. Right atrial size was severely dilated.   5. The mitral valve is abnormal. Severe mitral valve regurgitation. No  evidence of mitral stenosis.   6. Tricuspid valve regurgitation is mild to moderate.   7. The aortic valve is tricuspid. Aortic valve regurgitation is trivial.  No aortic stenosis is present.     Discharge Medications   Allergies as of 03/29/2022       Reactions   Pacerone [amiodarone] Itching, Rash        Medication List     STOP taking these medications    Entresto 97-103 MG Generic drug: sacubitril-valsartan Replaced by: sacubitril-valsartan 24-26 MG   furosemide 80 MG tablet Commonly known as: LASIX       TAKE these medications    dapagliflozin propanediol 10 MG Tabs tablet Commonly known as: FARXIGA Take 1 tablet (10 mg total) by mouth daily. Start taking on: March 30, 2022   digoxin 0.125 MG tablet Commonly known as: LANOXIN Take 1 tablet (0.125 mg total) by mouth  daily. Start taking on: March 30, 2022   metoprolol succinate 25 MG 24 hr tablet Commonly known as: TOPROL-XL Take 1 tablet (25 mg  total) by mouth daily. Start taking on: March 30, 2022 What changed:  medication strength See the new instructions.   OVER THE COUNTER MEDICATION Take 5 mLs by mouth in the morning. Black seed oil.   potassium chloride SA 20 MEQ tablet Commonly known as: KLOR-CON M Take 1 tablet (20 mEq total) by mouth daily.   rivaroxaban 20 MG Tabs tablet Commonly known as: Xarelto Take 1 tablet (20 mg total) by mouth daily with supper. What changed: See the new instructions.   sacubitril-valsartan 24-26 MG Commonly known as: ENTRESTO Take 1 tablet by mouth 2 (two) times daily. Replaces: Entresto 97-103 MG   sildenafil 25 MG tablet Commonly known as: VIAGRA Take 1 tablet (25 mg total) by mouth daily as needed for erectile dysfunction.   spironolactone 25 MG tablet Commonly known as: ALDACTONE Take 1 tablet (25 mg total) by mouth daily. What changed: See the new instructions.   torsemide 20 MG tablet Commonly known as: DEMADEX Take 1 tablet (20 mg total) by mouth daily.        Disposition   The patient will be discharged in stable condition to home.   Follow-up Information     [facility] Follow up.   Specialty: Cardiology Why: 04/08/22 at 3:00 PM. Hospital Follow-up for Heart Failure.   The Advanced Heart Failure Clinic at [facility], [place] information: [id number] [id number] Danice Goltz [place]        Constance Haw, MD Follow up.   Specialty: Cardiology Why: 04/21/22 at 11:15 AM   To discuss atrial flutter ablation Contact information: [address] [place] [address] [phone]                   Duration of Discharge Encounter: Greater than 35 minutes   Signed, Lyda Jester, PA-C  03/29/2022, 11:30 AM

## 2022-03-28 NOTE — Progress Notes (Addendum)
Advanced Heart Failure Rounding Note  PCP-Cardiologist: Arvilla Meres, MD   Subjective:    Co-ox stable off NE, 72%.   Brisk diuresis yesterday, 6.7 L in UOP. Wt down significantly since admit (30 lb). CVP 5-6   Scr stable 1.3  K 3.9   Maintaining NSR w/ PACs on tele. Having itching w/ amio.   Overall feels much better. No dyspnea.    Objective:   Weight Range: 110 kg Body mass index is 29.52 kg/m.   Vital Signs:   Temp:  [97.6 F (36.4 C)-98 F (36.7 C)] 98 F (36.7 C) (01/07 2300) Pulse Rate:  [61-113] 63 (01/08 0600) Resp:  [13-27] 18 (01/08 0600) BP: (93-129)/(51-102) 117/102 (01/08 0600) SpO2:  [92 %-97 %] 95 % (01/08 0600) Weight:  [110 kg] 110 kg (01/08 0600) Last BM Date : 03/25/22  Weight change: Filed Weights   03/26/22 0430 03/27/22 0530 03/28/22 0600  Weight: 118 kg 113.6 kg 110 kg    Intake/Output:   Intake/Output Summary (Last 24 hours) at 03/28/2022 0732 Last data filed at 03/28/2022 0600 Gross per 24 hour  Intake 1189.1 ml  Output 6650 ml  Net -5460.9 ml      Physical Exam    CVP 6  General:  Well appearing. No resp difficulty HEENT: Normal Neck: Supple. JVP not elevated . Carotids 2+ bilat; no bruits. No lymphadenopathy or thyromegaly appreciated. Cor: PMI nondisplaced. Regular rate & rhythm. No rubs, gallops or murmurs. Lungs: Clear Abdomen: Soft, nontender, nondistended. No hepatosplenomegaly. No bruits or masses. Good bowel sounds. Extremities: No cyanosis, clubbing, rash, edema Neuro: Alert & orientedx3, cranial nerves grossly intact. moves all 4 extremities w/o difficulty. Affect pleasant   Telemetry   NSR w/ PACs   EKG    N/A   Labs    CBC Recent Labs    03/26/22 0727 03/27/22 0529  WBC 4.6 5.3  HGB 12.5* 12.5*  HCT 38.0* 39.0  MCV 96.9 98.2  PLT 108* 105*   Basic Metabolic Panel Recent Labs    38/25/05 0529 03/28/22 0524  NA 137 135  K 3.3* 3.9  CL 100 95*  CO2 30 31  GLUCOSE 87 109*  BUN 14  15  CREATININE 1.44* 1.31*  CALCIUM 9.3 8.9   Liver Function Tests No results for input(s): "AST", "ALT", "ALKPHOS", "BILITOT", "PROT", "ALBUMIN" in the last 72 hours. No results for input(s): "LIPASE", "AMYLASE" in the last 72 hours. Cardiac Enzymes No results for input(s): "CKTOTAL", "CKMB", "CKMBINDEX", "TROPONINI" in the last 72 hours.  BNP: BNP (last 3 results) Recent Labs    03/24/22 1144  BNP 287.5*    ProBNP (last 3 results) No results for input(s): "PROBNP" in the last 8760 hours.   D-Dimer No results for input(s): "DDIMER" in the last 72 hours. Hemoglobin A1C No results for input(s): "HGBA1C" in the last 72 hours. Fasting Lipid Panel No results for input(s): "CHOL", "HDL", "LDLCALC", "TRIG", "CHOLHDL", "LDLDIRECT" in the last 72 hours. Thyroid Function Tests No results for input(s): "TSH", "T4TOTAL", "T3FREE", "THYROIDAB" in the last 72 hours.  Invalid input(s): "FREET3"  Other results:   Imaging    No results found.   Medications:     Scheduled Medications:  Chlorhexidine Gluconate Cloth  6 each Topical Daily   dapagliflozin propanediol  10 mg Oral Daily   digoxin  0.125 mg Oral Daily   furosemide  80 mg Intravenous BID   sodium chloride flush  10-40 mL Intracatheter Q12H   sodium chloride flush  3 mL Intravenous Q12H   spironolactone  25 mg Oral Daily    Infusions:  sodium chloride 10 mL/hr at 03/28/22 0600   amiodarone 30 mg/hr (03/28/22 0600)   heparin 1,600 Units/hr (03/28/22 0600)   norepinephrine (LEVOPHED) Adult infusion Stopped (03/27/22 0333)    PRN Medications: sodium chloride, acetaminophen, magnesium hydroxide, ondansetron (ZOFRAN) IV, sodium chloride flush, sodium chloride flush    Patient Profile   [name] is a 72 y.o. male with a history of persistent afib (s/p ablation 2012 at [facility] [place] 2019 & 2020, HTN, and chronic systolic HF.    Admitted with A flutter RVR and volume overload  Assessment/Plan   1. A  flutter RVR - Had Afib ablation 2012 and DC-CV 2019 and 2020 - S/P TEE DCCV 1/5; in normal sinus rhythm this AM with frequent PACs on amiodarone 30mg /hr gtt.  - He has had prior intolerance to amio (itching), recurrence this admit w/ IV - stop amio - add back metoprolol succinate 25 mg for rate control  - continue digoxin 0.125 mg - transition back to Xarelto today  - refer to EP outpatient to discuss AFL ablation    2. A/C HFrEF - EF 25% as far back as 2009.  - [place] 2012: normal coronaries. No ICD.  - Echo 09/12/17: EF 15-20%, diffuse HK, mild to mod MR - Echo 10/19  EF 45-50%, diffuse HK, mild MR, LA severely dilated, RA mildly dilated, RV normal - TEE this admit  w/ severely reduced LV function, suspect tachy-mediated from AFL; cardioverted to NSR.  - Echo in NSR showed EF 25-30%, moderate RV dysfunction/RV enlargement, severe MR, mild-moderate TR.  - Co-ox 72%, Well diuresed CVP down to 5-6 today  - Hold diuretics today, resume PO tomorrow - Restart Entreto 24-26 mg bid  - Continue Farxiga 10 mg - Continue Spironolactone 25 mg  - Digoxin 0.125 mg   Transfer out of ICU today. If BP stable w/ restart of GDMT, plan likely d/c home tomorrow   Length of Stay: [address] Ladoris Gene  03/28/2022, 7:32 AM  Advanced Heart Failure Team Pager [phone] (M-F; 7a - 5p)  Please contact [facility] for night-coverage after hours (5p -7a ) and weekends on [email or web address]   Patient seen with PA, agree with the above note.   Itching again with amiodarone.  He remains in NSR with PACs.  Good diuresis again, weight down.  CVP 5-6 today with co-ox 71%.    General: NAD Neck: No JVD, no thyromegaly or thyroid nodule.  Lungs: Clear to auscultation bilaterally with normal respiratory effort. CV: Nondisplaced PMI.  Heart regular S1/S2, no S3/S4, 2/6 HSM apex.  Trace ankle edema.  Abdomen: Soft, nontender, no hepatosplenomegaly, no distention.  Skin: Intact without lesions or rashes.  Neurologic:  Alert and oriented x 3.  Psych: Normal affect. Extremities: No clubbing or cyanosis.  HEENT: Normal.   Volume status improved and co-ox good. Suspect worsening LV function may be tachy-mediated, he is unsure how long he was in AFL prior to admission.  - Stop IV Lasix, will start back on po Lasix tomorrow.   - Continue digoxin, spironolactone, Farxiga.  - Start back on low dose Entresto 24/26 bid today, watch BP.   He remains in NSR on amiodarone gtt but is itching again with amiodarone (stopped in past due to itching).   - Stop amiodarone with recurrent itching. - Start Toprol XL 25 mg daily.  - Had atrial fibrillation ablation in past, would  like EP to look at for atrial flutter ablation.  Think we need to keep him in NSR given concern for tachycardia-mediated worsening of LV function (45-50% => 25-30%).   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  Will need to follow closely to see if this improves with NSR and medical management.    Marca Ancona 03/28/2022 8:09 AM

## 2022-03-28 NOTE — TOC Benefit Eligibility Note (Signed)
Patient Teacher, English as a foreign language completed.    The patient is currently admitted and upon discharge could be taking Xarelto 20 mg.  The current 30 day co-pay is $75.00.   The patient is insured through [place], [place] [facility] [place] Patient Advocate Team Direct Number: [phone]  Fax: [phone]

## 2022-03-28 NOTE — Anesthesia Postprocedure Evaluation (Signed)
Anesthesia Post Note  Patient: [name]  Procedure(s) Performed: TRANSESOPHAGEAL ECHOCARDIOGRAM (TEE) CARDIOVERSION     Patient location during evaluation: Endoscopy Anesthesia Type: General Level of consciousness: awake and patient cooperative Pain management: pain level controlled Vital Signs Assessment: post-procedure vital signs reviewed and stable Respiratory status: spontaneous breathing, nonlabored ventilation and respiratory function stable Cardiovascular status: blood pressure returned to baseline and stable Postop Assessment: no apparent nausea or vomiting Anesthetic complications: no  No notable events documented.  Last Vitals:  Vitals:   03/28/22 0600 03/28/22 0749  BP: (!) 117/102   Pulse: 63   Resp: 18   Temp:  36.8 C  SpO2: 95%     Last Pain:  Vitals:   03/28/22 0749  TempSrc: Oral  PainSc:                  [name]

## 2022-03-28 NOTE — Plan of Care (Signed)

## 2022-03-28 NOTE — Consult Note (Addendum)
Cardiology Consultation   Patient ID: [name] MRN: [id number]; DOB: 06/19/1950  Admit date: 03/24/2022 Date of Consult: 03/28/2022  PCP:  Renaye Rakers, MD   [facility] Providers Cardiologist:  Arvilla Meres, MD  EP: Dr. Elberta Fortis (2019)  Patient Profile:   [name] is a 72 y.o. male with a hx of AFib, HTN, chronic CHF (systolic)who is being seen 03/28/2022 for the evaluation of AFlutter at the request of Dr. Shirlee Latch.  AFib/AAD hx Convergent ablation at [facility] with Dr Hurman Horn in 2012, complicated by pericardial effusion that required drain.  Tikosyn 2013, unclear details, off for years Amiodarone is reported as an allergy (itching)  Saw Dr. Elberta Fortis June 2019, off his meds for several months, noted with an Aflutter, planned to get him on meds, OAC and pursue TEE/DCCV  TEE/DCCV 09/12/21 >> no further EP follow up  History of Present Illness:   [name] unfortunately had gotten LTF since early 2020, recently reached out to the HF clinic with progressive weight gain, he was seen 03/24/21 and found in AFlutter with RVR, off a/c for a couple years, sent to the ER for further evaluation and management  He was admitted started on heparin gtt, with stable BP, no urgently DCCV without a/c on board. TEE 03/25/22 EF  20-25% with diffuse hypokinesis.  The RV was mildly dilated with moderate systolic dysfunction.  Severe right atrial enlargement.  Severe left atrial enlargement, no LA appendage thrombus.  There was a PFO noted by color doppler. Severe TR, peak RV-RA gradient 39 mmHg.  Trileaflet aortic valve, no stenosis or regurgitation.  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 PISA ERO 0.44 cm^2. There was flattening but not flow reversal of the pulmonary venous systolic doppler signal.   (Complicated with hypotension/hypoxia > BVM ventilation and norepinephrine > recovered. DCCV to SR  He has had brisk diuresis,  Cumulatively  -8,572ml and 13.8kg  Amiodarone was tried though he developed itching and was stopped TODAY  EP is asked on board for consideration of possible EPS/ablation of his flutter with suspect that his presentation and new reduced LVEF is tachy-mediated.   Currently on: On dig 0.125 Lopressor 25mg  q6 > Toprol 25mg  daily started today  And heparin gtt for a/c  LABS K+ 3.9 BUN/Creat 15/1.31 Coox 72.1 WBC 5.3 H/H 12/39 Plts 105  He reports that when COVID came around his appts were cancelled and there after just lost track. He was off all his meds except lasix and Entresto.  A couple months ago his PMD started him on testosterone for muscle wasting/lack of energy, these noth seemed to improve but noted edema and weight gain started a few weeks afterwards, developed progressive SOB as well eventually prompting him to reach out to HF clinic.  He had no palpitations or CP, no cardiac awareness.   Past Medical History:  Diagnosis Date   Atrial tachycardia    Chronic HFrEF (heart failure with reduced ejection fraction) (HCC)    Hypertension    Mitral regurgitation    NICM (nonischemic cardiomyopathy) (HCC)    a. 2009 EF 25%; b. 05/2010 Cath: nl cors. EF 45%; c. 12/2017 Echo: EF 45-50%, diff HK, mild MR, sev dil LA.   Paroxysmal atrial flutter (HCC)    a. Dx in 05/2018.   Persistent atrial fibrillation (HCC)    a. 2012 s/p RFCA; b. 08/2017 s/p DCCV.    Past Surgical History:  Procedure Laterality Date   CARDIAC ELECTROPHYSIOLOGY STUDY  AND ABLATION  2012   Dr. Riccardo Dubin   CARDIOVERSION N/A 09/12/2017   Procedure: CARDIOVERSION;  Surgeon: Jake Bathe, MD;  Location: [facility] ENDOSCOPY;  Service: Cardiovascular;  Laterality: N/A;   CARDIOVERSION N/A 03/25/2022   Procedure: CARDIOVERSION;  Surgeon: Laurey Morale, MD;  Location: [facility] ENDOSCOPY;  Service: Cardiovascular;  Laterality: N/A;   LOOP RECORDER IMPLANT  06/11/2010   TEE WITHOUT CARDIOVERSION N/A 09/12/2017   Procedure: TRANSESOPHAGEAL  ECHOCARDIOGRAM (TEE);  Surgeon: Jake Bathe, MD;  Location: [facility] ENDOSCOPY;  Service: Cardiovascular;  Laterality: N/A;   TEE WITHOUT CARDIOVERSION N/A 03/25/2022   Procedure: TRANSESOPHAGEAL ECHOCARDIOGRAM (TEE);  Surgeon: Laurey Morale, MD;  Location: [facility] ENDOSCOPY;  Service: Cardiovascular;  Laterality: N/A;     Home Medications:  Prior to Admission medications   Medication Sig Start Date End Date Taking? Authorizing Provider  furosemide (LASIX) 80 MG tablet TAKE 1 TABLET(80 MG) BY MOUTH DAILY. MAY TAKE AN ADDITIONAL 40 MG AS NEEDED Patient taking differently: Take 40-80 mg by mouth See admin instructions. 80 mg every morning, may take an additional 40 mg as needed for fluid retention. 02/22/22  Yes Bensimhon, Bevelyn [place], MD  OVER THE COUNTER MEDICATION Take 5 mLs by mouth in the morning. Black seed oil.   Yes [provider]  sacubitril-valsartan (ENTRESTO) 97-103 MG TAKE 1 TABLET BY MOUTH TWICE DAILY Patient taking differently: Take 1 tablet by mouth 2 (two) times daily. 02/22/22  Yes Bensimhon, Bevelyn [place], MD  metoprolol succinate (TOPROL-XL) 100 MG 24 hr tablet TAKE 1 TABLET BY MOUTH AT BEDTIME, TAKE WITH OR IMMEDIATELY FOLLOWING A MEAL Patient not taking: Reported on 03/24/2022 10/31/18   Bensimhon, Bevelyn [place], MD  sildenafil (VIAGRA) 25 MG tablet Take 1 tablet (25 mg total) by mouth daily as needed for erectile dysfunction. Patient not taking: Reported on 03/24/2022 01/02/18   Bensimhon, Bevelyn [place], MD  spironolactone (ALDACTONE) 25 MG tablet TAKE 1 TABLET(25 MG) BY MOUTH DAILY Patient not taking: Reported on 03/24/2022 10/30/18   Bensimhon, Bevelyn [place], MD  XARELTO 20 MG TABS tablet TAKE 1 TABLET(20 MG) BY MOUTH DAILY WITH SUPPER Patient not taking: Reported on 03/24/2022 10/29/18   Bensimhon, Bevelyn [place], MD    Inpatient Medications: Scheduled Meds:  Chlorhexidine Gluconate Cloth  6 each Topical Daily   dapagliflozin propanediol  10 mg Oral Daily   digoxin  0.125 mg Oral Daily   metoprolol  succinate  25 mg Oral Daily   rivaroxaban  20 mg Oral Q supper   sacubitril-valsartan  1 tablet Oral BID   sodium chloride flush  10-40 mL Intracatheter Q12H   sodium chloride flush  3 mL Intravenous Q12H   spironolactone  25 mg Oral Daily   Continuous Infusions:  sodium chloride 10 mL/hr at 03/28/22 0600   PRN Meds: sodium chloride, acetaminophen, magnesium hydroxide, ondansetron (ZOFRAN) IV, sodium chloride flush, sodium chloride flush  Allergies:    Allergies  Allergen Reactions   Pacerone [Amiodarone] Itching and Rash    Social History:   Social History   Socioeconomic History   Marital status: Widowed    Spouse name: Not on file   Number of children: Not on file   Years of education: Not on file   Highest education level: Not on file  Occupational History   Not on file  Tobacco Use   Smoking status: Never   Smokeless tobacco: Never  Vaping Use   Vaping Use: Never used  Substance and Sexual Activity   Alcohol use: Yes  Comment: rare   Drug use: Never   Sexual activity: Not on file  Other Topics Concern   Not on file  Social History Narrative   Not on file   Social Determinants of Health   Financial Resource Strain: Not on file  Food Insecurity: No Food Insecurity (03/25/2022)   Hunger Vital Sign    Worried About Running Out of Food in the Last Year: Never true    Ran Out of Food in the Last Year: Never true  Transportation Needs: No Transportation Needs (03/25/2022)   PRAPARE - Administrator, Civil Service (Medical): No    Lack of Transportation (Non-Medical): No  Physical Activity: Not on file  Stress: Not on file  Social Connections: Not on file  Intimate Partner Violence: Not At Risk (03/25/2022)   Humiliation, Afraid, Rape, and Kick questionnaire    Fear of Current or Ex-Partner: No    Emotionally Abused: No    Physically Abused: No    Sexually Abused: No    Family History:   HTN by both parents His father died fairly young of a  cancer, he can not recall what kind  ROS:  Please see the history of present illness.  All other ROS reviewed and negative.     Physical Exam/Data:   Vitals:   03/28/22 0600 03/28/22 0749 03/28/22 0800 03/28/22 0900  BP: (!) 117/102  107/61 98/77  Pulse: 63  78 73  Resp: 18  16 16   Temp:  98.2 F (36.8 C)    TempSrc:  Oral    SpO2: 95%  96% 95%  Weight: 110 kg     Height:        Intake/Output Summary (Last 24 hours) at 03/28/2022 1133 Last data filed at 03/28/2022 0600 Gross per 24 hour  Intake 1069.1 ml  Output 6650 ml  Net -5580.9 ml      03/28/2022    6:00 AM 03/27/2022    5:30 AM 03/26/2022    4:30 AM  Last 3 Weights  Weight (lbs) 242 lb 8.1 oz 250 lb 7.1 oz 260 lb 2.3 oz  Weight (kg) 110 kg 113.6 kg 118 kg     Body mass index is 29.52 kg/m.  General:  Well nourished, well developed, in no acute distress HEENT: normal Neck: no JVD Vascular: No carotid bruits; Distal pulses 2+ bilaterally Cardiac:  RRR; some extrasystoles, soft SM, no gallops or rubs Lungs:  CTA b/l Abd: soft, nontender, no hepatomegaly  Ext: trace-1+ edema b/l Musculoskeletal:  No deformities Skin: warm and dry  Neuro:  no gross focal motor abnormalities noted Psych:  Normal affect   EKG:  The EKG was personally reviewed and demonstrates:    AFlutter 138bpm, looks atypical SR 81 , normal intervals  Telemetry:  Telemetry was personally reviewed and demonstrates:   SR 70's occ PACs, PVCs, infrequent PAT  Relevant CV Studies:  03/26/22: TTE  1. Left ventricular ejection fraction, by estimation, is 25 to 30%. The  left ventricle has severely decreased function. The left ventricle has no  regional wall motion abnormalities. There is moderate asymmetric left  ventricular hypertrophy of the  lateral segment. Left ventricular diastolic function could not be  evaluated. There is the interventricular septum is flattened in systole  and diastole, consistent with right ventricular pressure and volume   overload.   2. Right ventricular systolic function is moderately reduced. The right  ventricular size is moderately enlarged.   3. Left atrial size was  severely dilated.   4. Right atrial size was severely dilated.   5. The mitral valve is abnormal. Severe mitral valve regurgitation. No  evidence of mitral stenosis.   6. Tricuspid valve regurgitation is mild to moderate.   7. The aortic valve is tricuspid. Aortic valve regurgitation is trivial.  No aortic stenosis is present.   Comparison(s): Prior images unable to be directly viewed, comparison made  by report only. Changes from prior study are noted. LVEF improved from 20%  in 2019 to 30% now. Mitral regurgitation is severe now.    1028/2019; TTE Study Conclusions  - Left ventricle: The cavity size was severely dilated. Wall    thickness was normal. Systolic function was mildly reduced. The    estimated ejection fraction was in the range of 45% to 50%.    Diffuse hypokinesis.  - Mitral valve: Calcified annulus. There was mild regurgitation.  - Left atrium: The atrium was severely dilated.  - Right atrium: The atrium was mildly dilated.   Impressions:  - Mild global reduction in LV systolic function; severe LVE; mild    MR; biatrial enlargement.   Laboratory Data:  High Sensitivity Troponin:   Recent Labs  Lab 03/24/22 1512  TROPONINIHS 15     Chemistry Recent Labs  Lab 03/24/22 1144 03/25/22 0510 03/26/22 0727 03/27/22 0529 03/28/22 0524  NA 138   < > 137 137 135  K 4.2   < > 3.4* 3.3* 3.9  CL 102   < > 102 100 95*  CO2 28   < > 27 30 31   GLUCOSE 104*   < > 139* 87 109*  BUN 11   < > 13 14 15   CREATININE 1.21   < > 1.38* 1.44* 1.31*  CALCIUM 9.1   < > 8.9 9.3 8.9  MG 2.1  --   --   --   --   GFRNONAA >60   < > 55* 52* 58*  ANIONGAP 8   < > 8 7 9    < > = values in this interval not displayed.    Recent Labs  Lab 03/24/22 1144  PROT 6.9  ALBUMIN 3.2*  AST 25  ALT 20  ALKPHOS 60  BILITOT 0.7    Lipids No results for input(s): "CHOL", "TRIG", "HDL", "LABVLDL", "LDLCALC", "CHOLHDL" in the last 168 hours.  Hematology Recent Labs  Lab 03/25/22 0510 03/26/22 0727 03/27/22 0529  WBC 4.7 4.6 5.3  RBC 4.38 3.92* 3.97*  HGB 13.8 12.5* 12.5*  HCT 42.6 38.0* 39.0  MCV 97.3 96.9 98.2  MCH 31.5 31.9 31.5  MCHC 32.4 32.9 32.1  RDW 13.5 13.7 13.7  PLT 117* 108* 105*   Thyroid  Recent Labs  Lab 03/25/22 0510  TSH 0.567    BNP Recent Labs  Lab 03/24/22 1144  BNP 287.5*    DDimer No results for input(s): "DDIMER" in the last 168 hours.   Radiology/Studies:     Assessment and Plan:   AFlutter  (in review of this admission and old EKGs, he has 2 different flutters) Hx of AFib CHA2DS2Vasc is 3 Remote convergent procedure Poor OP follow up and medication compliance Transitioned to Xarelto today  Allergy to amiodarone w/itching Hx has been on Tikosyn remotely, he recalls it though has been so long, cannot recall why it was stopped  QTc maybe borderline to try Tikosyn On dig, no dig level He had 3 days of amiodarone gtt so would need to wash out if we were  to consider Tikosyn.  I do not anticipate in-patient EPS/ablation EP MD will see this afternoon/once out of procedure     Risk Assessment/Risk Scores:     For questions or updates, please contact [place] Please consult [email or web address] for contact info under    Signed, Baldwin Jamaica, PA-C  03/28/2022 11:33 AM

## 2022-03-28 NOTE — Progress Notes (Signed)
[place] for heparin > xarelto Indication: atrial fibrillation  Allergies  Allergen Reactions   Pacerone [Amiodarone] Itching and Rash    Patient Measurements: Height: 6\' 4"  (193 cm) Weight: 110 kg (242 lb 8.1 oz) IBW/kg (Calculated) : 86.8 Heparin Dosing Weight: 113.1kg   Vital Signs: Temp: 98.2 F (36.8 C) (01/08 0749) Temp Source: Oral (01/08 0749) BP: 98/77 (01/08 0900) Pulse Rate: 73 (01/08 0900)  Labs: Recent Labs    03/25/22 1325 03/26/22 0727 03/27/22 0529 03/28/22 0524  HGB  --  12.5* 12.5*  --   HCT  --  38.0* 39.0  --   PLT  --  108* 105*  --   LABPROT 15.3*  --   --   --   INR 1.2  --   --   --   HEPARINUNFRC 0.46 0.58 0.62 0.65  CREATININE  --  1.38* 1.44* 1.31*     Estimated Creatinine Clearance: 70.3 mL/min (A) (by C-G formula based on SCr of 1.31 mg/dL (H)).   Medical History: Past Medical History:  Diagnosis Date   Atrial tachycardia    Chronic HFrEF (heart failure with reduced ejection fraction) (HCC)    Hypertension    Mitral regurgitation    NICM (nonischemic cardiomyopathy) ([place])    a. 2009 EF 25%; b. 05/2010 Cath: nl cors. EF 45%; c. 12/2017 Echo: EF 45-50%, diff HK, mild MR, sev dil LA.   Paroxysmal atrial flutter ([place])    a. Dx in 05/2018.   Persistent atrial fibrillation ([place])    a. 2012 s/p RFCA; b. 08/2017 s/p DCCV.    Assessment: Patient admitted for afib and fluid overload. Hx of Afib previously on Xarelto but has been off for sometime. Patient says prescription ran out. S/p TEE/DCCV 1/5. Will aim to stay on heparin x 2-3 days during vulnerable period post-DCCV, then transition to [facility].   Heparin level at goal this AM 0.65.  CBC not drawn.  No overt bleeding or complications noted.  Pharmacy asked to transition back to Xarelto today.  Goal of Therapy:  Heparin level 0.3-0.7 units/ml Monitor platelets by anticoagulation protocol: Yes   Plan:  Stop IV heparin. Resume Xarelto 20 mg  daily. Pt education completed.  [place] Crane, Roylene Reason, BCCP Clinical Pharmacist  03/28/2022 11:35 AM   [facility] pharmacy phone numbers are listed on [email or web address]

## 2022-03-29 ENCOUNTER — Other Ambulatory Visit (HOSPITAL_COMMUNITY): Payer: Self-pay

## 2022-03-29 DIAGNOSIS — I5043 Acute on chronic combined systolic (congestive) and diastolic (congestive) heart failure: Secondary | ICD-10-CM | POA: Diagnosis not present

## 2022-03-29 LAB — COOXEMETRY PANEL
Carboxyhemoglobin: 2.1 % — ABNORMAL HIGH (ref 0.5–1.5)
Methemoglobin: 0.9 % (ref 0.0–1.5)
O2 Saturation: 67.7 %
Total hemoglobin: 14.1 g/dL (ref 12.0–16.0)

## 2022-03-29 LAB — BASIC METABOLIC PANEL
Anion gap: 10 (ref 5–15)
BUN: 18 mg/dL (ref 8–23)
CO2: 31 mmol/L (ref 22–32)
Calcium: 9.7 mg/dL (ref 8.9–10.3)
Chloride: 96 mmol/L — ABNORMAL LOW (ref 98–111)
Creatinine, Ser: 1.29 mg/dL — ABNORMAL HIGH (ref 0.61–1.24)
GFR, Estimated: 59 mL/min — ABNORMAL LOW (ref >60)
Glucose, Bld: 83 mg/dL (ref 70–99)
Potassium: 4.2 mmol/L (ref 3.5–5.1)
Sodium: 137 mmol/L (ref 135–145)

## 2022-03-29 MED ORDER — DIGOXIN 125 MCG PO TABS
0.1250 mg | ORAL_TABLET | Freq: Every day | ORAL | 5 refills | Status: DC
  Filled 2022-03-29: qty 30, 30d supply, fill #0
  Filled 2022-05-03: qty 30, 30d supply, fill #1
  Filled 2022-05-29: qty 30, 30d supply, fill #2
  Filled 2022-06-28: qty 30, 30d supply, fill #3
  Filled 2022-07-15 – 2022-07-26 (×3): qty 30, 30d supply, fill #4
  Filled 2022-08-24: qty 30, 30d supply, fill #5

## 2022-03-29 MED ORDER — METOPROLOL SUCCINATE ER 25 MG PO TB24
25.0000 mg | ORAL_TABLET | Freq: Every day | ORAL | 5 refills | Status: DC
  Filled 2022-03-29: qty 30, 30d supply, fill #0
  Filled 2022-05-03: qty 30, 30d supply, fill #1
  Filled 2022-05-29: qty 30, 30d supply, fill #2
  Filled 2022-06-28: qty 30, 30d supply, fill #3
  Filled 2022-07-15 – 2022-07-24 (×3): qty 30, 30d supply, fill #4
  Filled 2022-08-24: qty 30, 30d supply, fill #5

## 2022-03-29 MED ORDER — SPIRONOLACTONE 25 MG PO TABS
25.0000 mg | ORAL_TABLET | Freq: Every day | ORAL | 5 refills | Status: DC
  Filled 2022-03-29: qty 30, 30d supply, fill #0

## 2022-03-29 MED ORDER — POTASSIUM CHLORIDE CRYS ER 20 MEQ PO TBCR
20.0000 meq | EXTENDED_RELEASE_TABLET | Freq: Every day | ORAL | 5 refills | Status: DC
  Filled 2022-03-29: qty 30, 30d supply, fill #0

## 2022-03-29 MED ORDER — TORSEMIDE 20 MG PO TABS: 20.0000 mg | ORAL_TABLET | Freq: Every day | ORAL | Status: DC

## 2022-03-29 MED ORDER — RIVAROXABAN 20 MG PO TABS
20.0000 mg | ORAL_TABLET | Freq: Every day | ORAL | 5 refills | Status: DC
  Filled 2022-03-29: qty 30, 30d supply, fill #0
  Filled 2022-05-03: qty 30, 30d supply, fill #1
  Filled 2022-05-29: qty 30, 30d supply, fill #2
  Filled 2022-06-28: qty 30, 30d supply, fill #3
  Filled 2022-07-15 – 2022-07-26 (×3): qty 30, 30d supply, fill #4
  Filled 2022-08-24: qty 30, 30d supply, fill #5

## 2022-03-29 MED ORDER — SACUBITRIL-VALSARTAN 24-26 MG PO TABS
1.0000 | ORAL_TABLET | Freq: Two times a day (BID) | ORAL | 5 refills | Status: DC
  Filled 2022-03-29: qty 60, 30d supply, fill #0

## 2022-03-29 MED ORDER — TORSEMIDE 20 MG PO TABS
20.0000 mg | ORAL_TABLET | Freq: Every day | ORAL | 5 refills | Status: DC
  Filled 2022-03-29: qty 30, 30d supply, fill #0

## 2022-03-29 MED ORDER — DAPAGLIFLOZIN PROPANEDIOL 10 MG PO TABS
10.0000 mg | ORAL_TABLET | Freq: Every day | ORAL | 5 refills | Status: DC
  Filled 2022-03-29: qty 30, 30d supply, fill #0

## 2022-03-29 NOTE — Progress Notes (Signed)
Arrived to patient's room. Explained procedure. HOB less than 45*. Pt held breath upon line removal, pressure held for 5 min. No s/sx of bleeding. Pressure drsg applied and instructed to remain in bed for 30 min, pressure drsg to remain in place for 24 hrs keeping CDI. Instructed to monitor for s/sx of bleeding and to apply pressure to site and report as needed. Pt VU. Fran Lowes, RN VAST

## 2022-03-29 NOTE — TOC Transition Note (Signed)
Transition of Care [facility]) - CM/SW Discharge Note   Patient Details  Name: BLAKELEY MARGRAF MRN: [id number] Date of Birth: 09/26/1950  Transition of Care [facility]) CM/SW Contact:  Erenest Rasher, RN Phone Number: [phone] 03/29/2022, 12:11 PM   Clinical Narrative:     HF TOC CM spoke to pt at bedside. State he has scale for daily weight. Educated on the importance of daily weight. Provided pt with note for work. Pt has Medicare A&B, explained payor is not on chart.    Final next level of care: Home/Self Care Barriers to Discharge: No Barriers Identified   Patient Goals and CMS Choice      Discharge Placement       Discharge Plan and Services Additional resources added to the After Visit Summary for     Discharge Planning Services: CM Consult                                 Social Determinants of Health (SDOH) Interventions SDOH Screenings   Food Insecurity: No Food Insecurity (03/25/2022)  Housing: Low Risk  (03/25/2022)  Transportation Needs: No Transportation Needs (03/25/2022)  Utilities: Not At Risk (03/25/2022)  Tobacco Use: Low Risk  (03/28/2022)     Readmission Risk Interventions     No data to display

## 2022-03-29 NOTE — Progress Notes (Addendum)
Advanced Heart Failure Rounding Note  PCP-Cardiologist: Arvilla Meres, MD   Subjective:    Co-ox remains stable off NE, 68%. No current CVP set up. Euvolemic on exam. No further dyspnea. Maintaining NSR w/ PACs. HR 60s.   SCr 1.44>>1.39.  K 4.2   Feels much better. No current complaints.   Objective:   Weight Range: 110.6 kg Body mass index is 29.68 kg/m.   Vital Signs:   Temp:  [97.5 F (36.4 C)-97.7 F (36.5 C)] 97.7 F (36.5 C) (01/09 0357) Pulse Rate:  [53-118] 73 (01/09 0357) Resp:  [18-22] 18 (01/08 1950) BP: (86-179)/(48-159) 112/58 (01/09 0357) SpO2:  [93 %-100 %] 93 % (01/09 0357) Weight:  [110.6 kg] 110.6 kg (01/09 0357) Last BM Date : 03/25/22  Weight change: Filed Weights   03/27/22 0530 03/28/22 0600 03/29/22 0357  Weight: 113.6 kg 110 kg 110.6 kg    Intake/Output:   Intake/Output Summary (Last 24 hours) at 03/29/2022 0901 Last data filed at 03/29/2022 0355 Gross per 24 hour  Intake 445.23 ml  Output 800 ml  Net -354.77 ml      Physical Exam    General:  Well appearing. No respiratory difficulty HEENT: normal Neck: supple. no JVD. Carotids 2+ bilat; no bruits. No lymphadenopathy or thyromegaly appreciated. Cor: PMI nondisplaced. Regular rate & rhythm. No rubs, gallops or murmurs. Lungs: clear Abdomen: soft, nontender, nondistended. No hepatosplenomegaly. No bruits or masses. Good bowel sounds. Extremities: no cyanosis, clubbing, rash, edema Neuro: alert & oriented x 3, cranial nerves grossly intact. moves all 4 extremities w/o difficulty. Affect pleasant.    Telemetry   NSR w/ PACs HR 60s   EKG    N/A   Labs    CBC Recent Labs    03/27/22 0529  WBC 5.3  HGB 12.5*  HCT 39.0  MCV 98.2  PLT 105*   Basic Metabolic Panel Recent Labs    37/10/62 0524 03/29/22 0423  NA 135 137  K 3.9 4.2  CL 95* 96*  CO2 31 31  GLUCOSE 109* 83  BUN 15 18  CREATININE 1.31* 1.29*  CALCIUM 8.9 9.7   Liver Function Tests No results  for input(s): "AST", "ALT", "ALKPHOS", "BILITOT", "PROT", "ALBUMIN" in the last 72 hours. No results for input(s): "LIPASE", "AMYLASE" in the last 72 hours. Cardiac Enzymes No results for input(s): "CKTOTAL", "CKMB", "CKMBINDEX", "TROPONINI" in the last 72 hours.  BNP: BNP (last 3 results) Recent Labs    03/24/22 1144  BNP 287.5*    ProBNP (last 3 results) No results for input(s): "PROBNP" in the last 8760 hours.   D-Dimer No results for input(s): "DDIMER" in the last 72 hours. Hemoglobin A1C No results for input(s): "HGBA1C" in the last 72 hours. Fasting Lipid Panel No results for input(s): "CHOL", "HDL", "LDLCALC", "TRIG", "CHOLHDL", "LDLDIRECT" in the last 72 hours. Thyroid Function Tests No results for input(s): "TSH", "T4TOTAL", "T3FREE", "THYROIDAB" in the last 72 hours.  Invalid input(s): "FREET3"  Other results:   Imaging    No results found.   Medications:     Scheduled Medications:  Chlorhexidine Gluconate Cloth  6 each Topical Daily   dapagliflozin propanediol  10 mg Oral Daily   digoxin  0.125 mg Oral Daily   hydrocortisone cream   Topical TID   metoprolol succinate  25 mg Oral Daily   rivaroxaban  20 mg Oral Q supper   sacubitril-valsartan  1 tablet Oral BID   sodium chloride flush  10-40 mL Intracatheter Q12H  sodium chloride flush  3 mL Intravenous Q12H   spironolactone  25 mg Oral Daily    Infusions:  sodium chloride 10 mL/hr at 03/28/22 1500    PRN Medications: sodium chloride, acetaminophen, magnesium hydroxide, ondansetron (ZOFRAN) IV, sodium chloride flush, sodium chloride flush    Patient Profile   [name] is a 72 y.o. male with a history of persistent afib (s/p ablation 2012 at [facility], DC-CV 2019 & 2020, HTN, and chronic systolic HF.    Admitted with A flutter RVR and volume overload  Assessment/Plan   1. A flutter RVR - Had Afib ablation 2012 and DC-CV 2019 and 2020 - S/P TEE DCCV 1/5; in normal sinus rhythm this AM  with PACs   - He has had prior intolerance to amio (itching), recurrence this admit w/ IV. Amio discontinued - continue metoprolol succinate 25 mg for rate control  - continue digoxin 0.125 mg - continue Xarelto 20 mg daily  - EP has seen, will plan outpatient f/u w/ Dr. Elberta Fortis to discuss AFL ablation    2. A/C HFrEF - EF 25% as far back as 2009.  - LHC 2012: normal coronaries. No ICD.  - Echo 09/12/17: EF 15-20%, diffuse HK, mild to mod MR - Echo 10/19  EF 45-50%, diffuse HK, mild MR, LA severely dilated, RA mildly dilated, RV normal - TEE this admit  w/ severely reduced LV function, suspect tachy-mediated from AFL; cardioverted to NSR.  - Echo in NSR showed EF 25-30%, moderate RV dysfunction/RV enlargement, severe MR, mild-moderate TR. Suspect drop in EF is tachy mediated from AFL  - Co-ox 68%, Well diuresed and euvolemic  - transition to oral diuretic today, torsemide 20 mg daily  - Continue Entreto 24-26 mg bid  - Continue Farxiga 10 mg - Continue Spironolactone 25 mg  - Continue Toprol XL 25 mg daily  - Continue Digoxin 0.125 mg (will need dig level at post hos f/u)  3. 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   Plan d/c home today after MD assessment. Will arrange close outpatient f/u in the [facility]. Has EP clinic f/u w/ WC on 2/1.   Length of Stay: [address] PA-C  03/29/2022, 9:01 AM  Advanced Heart Failure Team Pager [phone] (M-F; 7a - 5p)  Please contact [facility] for night-coverage after hours (5p -7a ) and weekends on [email or web address]   Patient seen with PA, agree with the above note.   No complaints today, feels good.  He remains in NSR.   General: NAD Neck: No JVD, no thyromegaly or thyroid nodule.  Lungs: Clear to auscultation bilaterally with normal respiratory effort. CV: Nondisplaced PMI.  Heart regular S1/S2, no S3/S4, 2/6 HSM apex.  No peripheral  edema.   Abdomen: Soft, nontender, no hepatosplenomegaly, no distention.  Skin: Intact without lesions or rashes.  Neurologic: Alert and oriented x 3.  Psych: Normal affect. Extremities: No clubbing or cyanosis.  HEENT: Normal.   Patient looks euvolemic on exam.  He can start torsemide 20 mg daily today.  Continue current GDMT.   He has had 2 different atrial flutters noted. He is staying in NSR.  - No amiodarone use due to severe itching.  - He will need EP appt as outpatient for consideration of ablation.  - Continue Xarelto and Toprol XL.   He can go home today.  He needs followup in  CHF clinic and with Dr. Curt Bears to consider ablation.  Meds for home: Xarelto 20 mg daily, torsemide 20 mg daily, spironolactone 25 mg daily, Farxiga 10, Entresto 24/26 bid, digoxin 0.125, Toprol XL 25.   Loralie Champagne 03/29/2022 11:18 AM

## 2022-04-08 ENCOUNTER — Other Ambulatory Visit (HOSPITAL_COMMUNITY): Payer: Self-pay

## 2022-04-08 ENCOUNTER — Ambulatory Visit (HOSPITAL_COMMUNITY)
Admit: 2022-04-08 | Discharge: 2022-04-08 | Disposition: A | Discharge status: Home / Self Care | Payer: 59 | Attending: Family Medicine | Admitting: Family Medicine

## 2022-04-08 ENCOUNTER — Encounter (HOSPITAL_COMMUNITY): Payer: Self-pay

## 2022-04-08 ENCOUNTER — Other Ambulatory Visit (HOSPITAL_COMMUNITY): Payer: Self-pay | Admitting: Family Medicine

## 2022-04-08 VITALS — BP 130/86 | HR 89 | Wt 247.0 lb

## 2022-04-08 DIAGNOSIS — Z7984 Long term (current) use of oral hypoglycemic drugs: Secondary | ICD-10-CM | POA: Insufficient documentation

## 2022-04-08 DIAGNOSIS — I110 Hypertensive heart disease with heart failure: Secondary | ICD-10-CM | POA: Diagnosis not present

## 2022-04-08 DIAGNOSIS — I4892 Unspecified atrial flutter: Secondary | ICD-10-CM

## 2022-04-08 DIAGNOSIS — Z79899 Other long term (current) drug therapy: Secondary | ICD-10-CM | POA: Insufficient documentation

## 2022-04-08 DIAGNOSIS — I340 Nonrheumatic mitral (valve) insufficiency: Secondary | ICD-10-CM | POA: Diagnosis not present

## 2022-04-08 DIAGNOSIS — Z7901 Long term (current) use of anticoagulants: Secondary | ICD-10-CM | POA: Insufficient documentation

## 2022-04-08 DIAGNOSIS — R0683 Snoring: Secondary | ICD-10-CM

## 2022-04-08 DIAGNOSIS — I5022 Chronic systolic (congestive) heart failure: Secondary | ICD-10-CM

## 2022-04-08 DIAGNOSIS — I5023 Acute on chronic systolic (congestive) heart failure: Secondary | ICD-10-CM

## 2022-04-08 LAB — CBC
HCT: 48.3 % (ref 39.0–52.0)
Hemoglobin: 16.1 g/dL (ref 13.0–17.0)
MCH: 31.5 pg (ref 26.0–34.0)
MCHC: 33.3 g/dL (ref 30.0–36.0)
MCV: 94.5 fL (ref 80.0–100.0)
Platelets: 124 10*3/uL — ABNORMAL LOW (ref 150–400)
RBC: 5.11 MIL/uL (ref 4.22–5.81)
RDW: 13 % (ref 11.5–15.5)
WBC: 3.9 10*3/uL — ABNORMAL LOW (ref 4.0–10.5)
nRBC: 0 % (ref 0.0–0.2)

## 2022-04-08 LAB — BASIC METABOLIC PANEL
Anion gap: 4 — ABNORMAL LOW (ref 5–15)
BUN: 37 mg/dL — ABNORMAL HIGH (ref 8–23)
CO2: 31 mmol/L (ref 22–32)
Calcium: 9.7 mg/dL (ref 8.9–10.3)
Chloride: 98 mmol/L (ref 98–111)
Creatinine, Ser: 1.94 mg/dL — ABNORMAL HIGH (ref 0.61–1.24)
GFR, Estimated: 36 mL/min — ABNORMAL LOW (ref >60)
Glucose, Bld: 100 mg/dL — ABNORMAL HIGH (ref 70–99)
Potassium: 6 mmol/L — ABNORMAL HIGH (ref 3.5–5.1)
Sodium: 133 mmol/L — ABNORMAL LOW (ref 135–145)

## 2022-04-08 LAB — ECHO TEE
MV M vel: 4.2 m/s
MV Peak grad: 70.6 mmHg
Radius: 0.85 cm

## 2022-04-08 LAB — DIGOXIN LEVEL: Digoxin Level: 0.4 ng/mL — ABNORMAL LOW (ref 0.8–2.0)

## 2022-04-08 LAB — BRAIN NATRIURETIC PEPTIDE: B Natriuretic Peptide: 66 pg/mL (ref 0.0–100.0)

## 2022-04-08 MED ORDER — LOKELMA 10 G PO PACK: 10.0000 g | PACK | Freq: Every day | ORAL | 0 refills | Status: DC

## 2022-04-08 MED ORDER — ENTRESTO 49-51 MG PO TABS: 1.0000 | ORAL_TABLET | Freq: Two times a day (BID) | ORAL | 8 refills | Status: DC

## 2022-04-08 NOTE — Progress Notes (Addendum)
Patient Name: [name]         DOB: Jul 10, 1950      Height:  61ft 4in   Weight: 247 lbs  Office Name: Advance Heart Failure         Referring Provider: Allena Katz NP  Today's Date: 04/08/2022  Date:  04/08/2022 STOP BANG RISK ASSESSMENT S (snore) Have you been told that you snore?     YES   T (tired) Are you often tired, fatigued, or sleepy during the day?   YES  O (obstruction) Do you stop breathing, choke, or gasp during sleep? NO   P (pressure) Do you have or are you being treated for high blood pressure? YES   B (BMI) Is your body index greater than 35 kg/m? NO   A (age) Are you 83 years old or older? YES   N (neck) Do you have a neck circumference greater than 16 inches?   NO   G (gender) Are you a male? YES   TOTAL STOP/BANG "YES" ANSWERS 5                                                                       For Office Use Only              Procedure Order Form    YES to 3+ Stop Bang questions OR two clinical symptoms - patient qualifies for WatchPAT (CPT 95800)             Clinical Notes: Will consult Sleep Specialist and refer for management of therapy due to patient increased risk of Sleep Apnea. Ordering a sleep study due to the following two clinical symptoms: Excessive daytime sleepiness G47.10 / Gastroesophageal reflux K21.9 / Nocturia R35.1 / Morning Headaches G44.221 / Difficulty concentrating R41.840 / Memory problems or poor judgment G31.84 / Personality changes or irritability R45.4 / Loud snoring R06.83 / Depression F32.9 / Unrefreshed by sleep G47.8 / Impotence N52.9 / History of high blood pressure R03.0 / Insomnia G47.00    I understand that I am proceeding with a home sleep apnea test as ordered by my treating physician. I understand that untreated sleep apnea is a serious cardiovascular risk factor and it is my responsibility to perform the test and seek management for sleep apnea. I will be contacted with the results and be managed for sleep  apnea by a local sleep physician. I will be receiving equipment and further instructions from [facility]. I shall promptly ship back the equipment via the included mailing label. I understand my insurance will be billed for the test and as the patient I am responsible for any insurance related out-of-pocket costs incurred. I have been provided with written instructions and can call for additional video or telephonic instruction, with 24-hour availability of qualified personnel to answer any questions: Patient Help Desk [phone].  Patient Signature ______________________________________________________   Date______________________ Patient Telemedicine Verbal Consent

## 2022-04-08 NOTE — Addendum Note (Signed)
Encounter addended by: Rafael Bihari, FNP on: 04/08/2022 3:57 PM  Actions taken: Letter saved, Clinical Note Signed

## 2022-04-08 NOTE — Progress Notes (Addendum)
Advanced Heart Failure Clinic Note   PCP: Lucianne Lei, MD HF Cardiologist: Dr Haroldine Laws EP: Curt Bears  HPI: [name] is a 72 y.o. male with a history of persistent afib (s/p ablation 2012, DCCV 09/12/17), HTN, and chronic systolic HF.   Underwent DC-CV of AF on 09/12/17.   Admitted 6/27-09/18/17 from afib clinic with volume overload and AKI. HF team consulted. Echo showed EF 15-20%. Diuresed 29 lbs with IV lasix. Transitioned to lasix 80/40. HF medications optimized as able. AKI resolved with diuresis. DC weight: 263 lbs.   Last seen 05/2018, volume up in setting of AFL with RVR. Taken to ED for diuresis and DCCV, converted to NSR. Unfortunately, lost to follow up.  He started taking testosterone 3 times a week 11/2021 and started noticing weight gain. He stopped testosterone injections around 12/23. Weight has gone up at least 20 pounds.    He was seen in the HF clinic 03/24/22, and was found to be in A flutter RVR and volume overloaded. Sent to the ED for evaluation. Admitted by cardiology and started on IV lasix. Stable in ED so urgent cardioversion was not pursued. Placed on lopressor and heparin for AFL. Underwent TEE/DCCV. TEE showed EF 20-25% with severe central MR, DCCV successful to NSR. Became hypotensive post DCCV, and placed on NE. Amio gtt started to keep in NSR. Required ICU monitoring, PICC placed and CoOx followed. Echo (in NSR) showed EF 25-30%, RV moderately down, severe MR. Drips eventually weaned off, unable to tolerate amio 2/2 itching. EP planning to discuss ablation outpatient. GDMT titrated and he was discharged home, weight 243 lbs.   Today he returns for post hospital HF follow up. Overall feeling fine. Knee OA limits walking up stairs but no SOB walking on flat ground or lifting at work. 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 Denies palpitations, abnormal bleeding, CP, dizziness, edema, or PND/Orthopnea.  Appetite ok. No fever or chills. Weight at home 239 pounds. Taking all medications. He snores.   Cardiac Studies:  - TEE/DCCV (1/24): LVEF 20-25% w/ diffuse HK, RV mildly dilated w/ moderate systolic dysfunction, severe BAE w/ severe central mitral regurgitation, no LA thrombus.   - Echo (1/24): EF 25-30%, moderate asymmetric LVH, RV moderately reduced, severe MR, mild to moderate TR  - 14-day monitor (12/19): mostly NSR average HR 74 bpm, one run of NSVT lasting 4 beats, atrial fibrillation (2% burden), ranging from 68-195 bpm (avg of 116 bpm), the longest lasting 4 hours 34 mins with an avg rate of 115 bpm, rare PVCs.   - Echo (10/19): EF 45-50%, diffuse HK, mild MR, LA severely dilated, RA mildly dilated, RV normal  - TEE (6/19): EF 15-20%, diffuse HK, mild to mod MR, RV systolic function reduced, RA and LA dilated  - LHC (3/12): 1. Normal coronaries, mild global LV dysfunction, most likely AFib mediated. 2. No mitral regurgitation.  Review of systems complete and found to be negative unless listed in HPI.   Past Medical History:  Diagnosis Date   Atrial tachycardia    Chronic HFrEF (heart failure with reduced ejection fraction) (HCC)    Hypertension    Mitral regurgitation    NICM (nonischemic cardiomyopathy) ([place])    a. 2009 EF 25%; b. 05/2010 Cath: nl cors. EF 45%; c. 12/2017 Echo: EF 45-50%, diff HK, mild MR, sev dil LA.   Paroxysmal atrial flutter ([place])    a. Dx in 05/2018.  Persistent atrial fibrillation (HCC)    a. 2012 s/p RFCA; b. 08/2017 s/p DCCV.   Current Outpatient Medications  Medication Sig Dispense Refill   dapagliflozin propanediol (FARXIGA) 10 MG TABS tablet Take 1 tablet (10 mg total) by mouth daily. 30 tablet 5   digoxin (LANOXIN) 0.125 MG tablet Take 1 tablet (0.125 mg total) by mouth daily. 30 tablet 5   metoprolol succinate (TOPROL-XL) 25 MG 24 hr tablet Take 1 tablet (25 mg total) by mouth daily. 30 tablet 5   OVER THE COUNTER MEDICATION Take 5 mLs by  mouth in the morning. Black seed oil.     potassium chloride SA (KLOR-CON M) 20 MEQ tablet Take 1 tablet (20 mEq total) by mouth daily. 30 tablet 5   rivaroxaban (XARELTO) 20 MG TABS tablet Take 1 tablet (20 mg total) by mouth daily with supper. 30 tablet 5   sacubitril-valsartan (ENTRESTO) 24-26 MG Take 1 tablet by mouth 2 (two) times daily. 60 tablet 5   sildenafil (VIAGRA) 25 MG tablet Take 1 tablet (25 mg total) by mouth daily as needed for erectile dysfunction. 10 tablet 0   spironolactone (ALDACTONE) 25 MG tablet Take 1 tablet (25 mg total) by mouth daily. 30 tablet 5   torsemide (DEMADEX) 20 MG tablet Take 1 tablet (20 mg total) by mouth daily. 30 tablet 5   No current facility-administered medications for this encounter.   Allergies  Allergen Reactions   Pacerone [Amiodarone] Itching and Rash   Social History   Socioeconomic History   Marital status: Widowed    Spouse name: Not on file   Number of children: Not on file   Years of education: Not on file   Highest education level: Not on file  Occupational History   Not on file  Tobacco Use   Smoking status: Never   Smokeless tobacco: Never  Vaping Use   Vaping Use: Never used  Substance and Sexual Activity   Alcohol use: Yes    Comment: rare   Drug use: Never   Sexual activity: Not on file  Other Topics Concern   Not on file  Social History Narrative   Not on file   Social Determinants of Health   Financial Resource Strain: Not on file  Food Insecurity: No Food Insecurity (03/25/2022)   Hunger Vital Sign    Worried About Running Out of Food in the Last Year: Never true    Ran Out of Food in the Last Year: Never true  Transportation Needs: No Transportation Needs (03/25/2022)   PRAPARE - Administrator, Civil Service (Medical): No    Lack of Transportation (Non-Medical): No  Physical Activity: Not on file  Stress: Not on file  Social Connections: Not on file  Intimate Partner Violence: Not At Risk  (03/25/2022)   Humiliation, Afraid, Rape, and Kick questionnaire    Fear of Current or Ex-Partner: No    Emotionally Abused: No    Physically Abused: No    Sexually Abused: No   BP 130/86   Pulse 89   Wt 112 kg (247 lb)   SpO2 95%   BMI 30.07 kg/m   Wt Readings from Last 3 Encounters:  04/08/22 112 kg (247 lb)  03/29/22 110.6 kg (243 lb 12.8 oz)  03/24/22 123.8 kg (273 lb)   PHYSICAL EXAM: General:  NAD. No resp difficulty, walked into clinic HEENT: Normal Neck: Supple. No JVD. Carotids 2+ bilat; no bruits. No lymphadenopathy or thryomegaly appreciated. Cor: PMI nondisplaced.  Regular rate & rhythm. No rubs, gallops , 2/6 HSM apex Lungs: Clear Abdomen: Soft, nontender, nondistended. No hepatosplenomegaly. No bruits or masses. Good bowel sounds. Extremities: No cyanosis, clubbing, rash, edema; compression hose on. Neuro: Alert & oriented x 3, cranial nerves grossly intact. Moves all 4 extremities w/o difficulty. Affect pleasant.  ECG (personally reviewed): NSR 74 bpm  ASSESSMENT & PLAN: 1. A flutter - Had Afib ablation 2012 and DC-CV 2019 and 2020 - AFL with RVR 03/2022. - S/P TEE/DCCV 03/25/22.  - He has had prior intolerance to amio (itching), recurrence this admit w/ IV. Amio discontinued. - NSR on ECG today. - Continue metoprolol succinate 25 mg daily for rate control.  - Continue digoxin 0.125 mg daily. - Continue Xarelto 20 mg daily  - He has follow up w/ Dr. Curt Bears to discuss AFL ablation.  - CBC today.   2. Chronic Systolic Heart Failure - EF 25% as far back as 2009.  - LHC (2012): normal coronaries. No ICD.  - Echo (09/12/17): EF 15-20%, diffuse HK, mild to mod MR - Echo (10/19) EF 45-50%, diffuse HK, mild MR, LA severely dilated, RA mildly dilated, RV normal - TEE this admit (1/24) w/ severely reduced LV function, suspect tachy-mediated from AFL; cardioverted to NSR.  - Echo in NSR (1/24) showed EF 25-30%, moderate RV dysfunction/RV enlargement, severe MR,  mild-moderate TR. Suspect drop in EF is tachy mediated from AFL  - NYHA II today, volume looks good. - Increase Entresto to 49/51 mg bid. - Continue torsemide 20 mg daily + 20 KCL daily.  - Continue Farxiga 10 mg daily. No GU symptoms. - Continue spironolactone 25 mg  - Continue Toprol XL 25 mg daily.  - Continue digoxin 0.125 mg daily. - BMET, BNP and dig level today. Repeat BMET in 10 days. - Repeat echo in 6 weeks, now that he is back in NSR.   3. 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   4. Snoring - Arrange home sleep study.   Repeat echo in 6 weeks. Follow up in 2-3 months with Dr. Haroldine Laws. I have asked him to stay out of work until repeat echo; he was given a note today for his employer. He will drop of paperwork required by his employer.  Allena Katz, FNP-BC 04/08/22

## 2022-04-08 NOTE — Patient Instructions (Addendum)
Thank you for coming in today  Labs were done today, if any labs are abnormal the clinic will call you No news is good news  INCREASE Entresto 49/51 mg 1 tablet twice daily  Your physician recommends that you schedule a follow-up appointment in:  2-3 months with Dr. Haroldine Laws You will receive a reminder letter in the mail a few months in advance. If you don't receive a letter, please call our office to schedule the follow-up appointment.   Your physician recommends that you return for lab work in:  10-14 days BMET   PLEASE schedule a follow up appointment with your Primary care doctor    Your physician recommends that you schedule a follow-up appointment in:  Echocardiogram in 6 weeks   Your physician has requested that you have an echocardiogram. Echocardiography is a painless test that uses sound waves to create images of your heart. It provides your doctor with information about the size and shape of your heart and how well your heart's chambers and valves are working. This procedure takes approximately one hour. There are no restrictions for this procedure.     Do the following things EVERYDAY: Weigh yourself in the morning before breakfast. Write it down and keep it in a log. Take your medicines as prescribed Eat low salt foods--Limit salt (sodium) to 2000 mg per day.  Stay as active as you can everyday Limit all fluids for the day to less than 2 liters  At the [facility], you and your health needs are our priority. As part of our continuing mission to provide you with exceptional heart care, we have created designated Provider Care Teams. These Care Teams include your primary Cardiologist (physician) and Advanced Practice Providers (APPs- Physician Assistants and Nurse Practitioners) who all work together to provide you with the care you need, when you need it.   You may see any of the following providers on your designated Care Team at your next follow  up: Dr Glori Bickers Dr Loralie Champagne Dr. Roxana Hires, NP Lyda Jester, [place] [facility] [place], [place] Forestine Na, NP Audry Riles, PharmD   Please be sure to bring in all your medications bottles to every appointment.   If you have any questions or concerns before your next appointment please send [place] a message through [employer] or call our office at [phone].    TO LEAVE A MESSAGE FOR THE NURSE SELECT OPTION 2, PLEASE LEAVE A MESSAGE INCLUDING: YOUR NAME DATE OF BIRTH CALL BACK NUMBER REASON FOR CALL**this is important as we prioritize the call backs  YOU WILL RECEIVE A CALL BACK THE SAME DAY AS LONG AS YOU CALL BEFORE 4:00 PM

## 2022-04-11 ENCOUNTER — Ambulatory Visit (HOSPITAL_COMMUNITY)
Admission: RE | Admit: 2022-04-11 | Discharge: 2022-04-11 | Disposition: A | Discharge status: Home / Self Care | Payer: 59 | Source: Ambulatory Visit | Attending: Internal Medicine | Admitting: Internal Medicine

## 2022-04-11 DIAGNOSIS — I5022 Chronic systolic (congestive) heart failure: Secondary | ICD-10-CM

## 2022-04-11 LAB — BASIC METABOLIC PANEL
Anion gap: 7 (ref 5–15)
BUN: 29 mg/dL — ABNORMAL HIGH (ref 8–23)
CO2: 26 mmol/L (ref 22–32)
Calcium: 9.1 mg/dL (ref 8.9–10.3)
Chloride: 101 mmol/L (ref 98–111)
Creatinine, Ser: 1.48 mg/dL — ABNORMAL HIGH (ref 0.61–1.24)
GFR, Estimated: 50 mL/min — ABNORMAL LOW (ref >60)
Glucose, Bld: 90 mg/dL (ref 70–99)
Potassium: 5.3 mmol/L — ABNORMAL HIGH (ref 3.5–5.1)
Sodium: 134 mmol/L — ABNORMAL LOW (ref 135–145)

## 2022-04-13 ENCOUNTER — Telehealth (HOSPITAL_COMMUNITY): Payer: Self-pay | Admitting: Cardiology

## 2022-04-13 MED ORDER — TORSEMIDE 20 MG PO TABS: 20.0000 mg | ORAL_TABLET | Freq: Every day | ORAL | 5 refills | Status: DC

## 2022-04-13 MED ORDER — DAPAGLIFLOZIN PROPANEDIOL 10 MG PO TABS: 10.0000 mg | ORAL_TABLET | Freq: Every day | ORAL | 5 refills | Status: DC

## 2022-04-13 NOTE — Telephone Encounter (Signed)
-----  Message from Rafael Bihari,  sent at 04/12/2022  8:10 AM EST ----- K and kidney function improved. Remain off spiro, KCL and Entresto for now. Please restart Farxiga 10 mg daily and torsemide 20 mg daily.   Please arrange pharmacy appt in 1-2 weeks to add back other meds.

## 2022-04-13 NOTE — Telephone Encounter (Signed)
Patient called.  Patient aware.  

## 2022-04-15 ENCOUNTER — Other Ambulatory Visit (HOSPITAL_COMMUNITY): Payer: Self-pay

## 2022-04-15 ENCOUNTER — Encounter: Payer: Self-pay | Admitting: *Deleted

## 2022-04-15 NOTE — Progress Notes (Signed)
Pt's disability forms completed, signed by Dr Haroldine Laws, and faxed to [employer] at [phone]

## 2022-04-18 ENCOUNTER — Other Ambulatory Visit (HOSPITAL_COMMUNITY): Payer: Self-pay

## 2022-04-19 ENCOUNTER — Encounter (HOSPITAL_COMMUNITY): Payer: Self-pay

## 2022-04-19 ENCOUNTER — Other Ambulatory Visit (HOSPITAL_COMMUNITY): Payer: 59

## 2022-04-21 ENCOUNTER — Ambulatory Visit: Payer: 59 | Attending: Cardiology | Admitting: Cardiology

## 2022-04-21 ENCOUNTER — Encounter: Payer: Self-pay | Admitting: Cardiology

## 2022-04-21 VITALS — BP 152/84 | HR 78 | Ht 76.0 in | Wt 240.0 lb

## 2022-04-21 DIAGNOSIS — I484 Atypical atrial flutter: Secondary | ICD-10-CM

## 2022-04-21 DIAGNOSIS — D6869 Other thrombophilia: Secondary | ICD-10-CM | POA: Diagnosis not present

## 2022-04-21 DIAGNOSIS — I4819 Other persistent atrial fibrillation: Secondary | ICD-10-CM

## 2022-04-21 DIAGNOSIS — I5022 Chronic systolic (congestive) heart failure: Secondary | ICD-10-CM

## 2022-04-21 NOTE — Progress Notes (Signed)
Electrophysiology Office Note   Date:  04/21/2022   ID:  OSMIN WELZ, DOB December 03, 1950, MRN [id number]  PCP:  Lucianne Lei, MD  Cardiologist:  [place] Primary Electrophysiologist:  Samantha Ragen Meredith Leeds, MD    Chief Complaint: AF   History of Present Illness: [name] is a 72 y.o. male who is being seen today for the evaluation of AF at the request of Lucianne Lei, MD. Presenting today for electrophysiology evaluation.  He has a history significant for atrial fibrillation, hypertension, chronic systolic heart failure.  He was hospitalized January 2024 with atrial flutter and heart failure exacerbation.  He does have a longstanding history of atrial fibrillation.  He had convergent ablation at [facility] in 4098 complicated by pericardial effusion.  He was loaded on dofetilide in 2013 but this has been stopped.  He has an allergy to amiodarone with itching.  He did had TEE cardioversion 09/12/2021 but had no EP follow-up.  He is now status post TEE and cardioversion 03/28/2022.  He has remained in sinus rhythm.  He currently feels well without complaint.  He is able to do all of his daily activities without restriction.  Today, he denies symptoms of palpitations, chest pain, shortness of breath, orthopnea, PND, lower extremity edema, claudication, dizziness, presyncope, syncope, bleeding, or neurologic sequela. The patient is tolerating medications without difficulties.    Past Medical History:  Diagnosis Date   Atrial tachycardia    Chronic HFrEF (heart failure with reduced ejection fraction) (HCC)    Hypertension    Mitral regurgitation    NICM (nonischemic cardiomyopathy) ([place])    a. 2009 EF 25%; b. 05/2010 Cath: nl cors. EF 45%; c. 12/2017 Echo: EF 45-50%, diff HK, mild MR, sev dil LA.   Paroxysmal atrial flutter ([place])    a. Dx in 05/2018.   Persistent atrial fibrillation ([place])    a. 2012 s/p RFCA; b. 08/2017 s/p DCCV.   Past Surgical History:  Procedure Laterality Date    CARDIAC ELECTROPHYSIOLOGY STUDY AND ABLATION  2012   Dr. Viann Shove   CARDIOVERSION N/A 09/12/2017   Procedure: CARDIOVERSION;  Surgeon: Jerline Pain, MD;  Location: [facility] ENDOSCOPY;  Service: Cardiovascular;  Laterality: N/A;   CARDIOVERSION N/A 03/25/2022   Procedure: CARDIOVERSION;  Surgeon: Larey Dresser, MD;  Location: [facility] ENDOSCOPY;  Service: Cardiovascular;  Laterality: N/A;   LOOP RECORDER IMPLANT  06/11/2010   TEE WITHOUT CARDIOVERSION N/A 09/12/2017   Procedure: TRANSESOPHAGEAL ECHOCARDIOGRAM (TEE);  Surgeon: Jerline Pain, MD;  Location: [facility] ENDOSCOPY;  Service: Cardiovascular;  Laterality: N/A;   TEE WITHOUT CARDIOVERSION N/A 03/25/2022   Procedure: TRANSESOPHAGEAL ECHOCARDIOGRAM (TEE);  Surgeon: Larey Dresser, MD;  Location: [facility] ENDOSCOPY;  Service: Cardiovascular;  Laterality: N/A;     Current Outpatient Medications  Medication Sig Dispense Refill   dapagliflozin propanediol (FARXIGA) 10 MG TABS tablet Take 1 tablet (10 mg total) by mouth daily. 30 tablet 5   digoxin (LANOXIN) 0.125 MG tablet Take 1 tablet (0.125 mg total) by mouth daily. 30 tablet 5   metoprolol succinate (TOPROL-XL) 25 MG 24 hr tablet Take 1 tablet (25 mg total) by mouth daily. 30 tablet 5   OVER THE COUNTER MEDICATION Take 5 mLs by mouth in the morning. Black seed oil.     rivaroxaban (XARELTO) 20 MG TABS tablet Take 1 tablet (20 mg total) by mouth daily with supper. 30 tablet 5   sildenafil (VIAGRA) 25 MG tablet Take 1 tablet (25 mg total) by mouth daily as needed  for erectile dysfunction. 10 tablet 0   sodium zirconium cyclosilicate (LOKELMA) 10 g PACK packet Take 10 g by mouth daily. 1 packet 0   torsemide (DEMADEX) 20 MG tablet Take 1 tablet (20 mg total) by mouth daily. 30 tablet 5   No current facility-administered medications for this visit.    Allergies:   Pacerone [amiodarone]   Social History:  The patient  reports that he has never smoked. He has never used smokeless tobacco. He reports current  alcohol use. He reports that he does not use drugs.   Family History:  The patient's family history is not on file.    ROS:  Please see the history of present illness.   Otherwise, review of systems is positive for none.   All other systems are reviewed and negative.    PHYSICAL EXAM: VS:  BP (!) 152/84   Pulse 78   Ht 6\' 4"  (1.93 m)   Wt 240 lb (108.9 kg)   SpO2 96%   BMI 29.21 kg/m  , BMI Body mass index is 29.21 kg/m. GEN: Well nourished, well developed, in no acute distress  HEENT: normal  Neck: no JVD, carotid bruits, or masses Cardiac: RRR; no murmurs, rubs, or gallops,no edema  Respiratory:  clear to auscultation bilaterally, normal work of breathing GI: soft, nontender, nondistended, + BS MS: no deformity or atrophy  Skin: warm and dry Neuro:  Strength and sensation are intact Psych: euthymic mood, full affect  EKG:  EKG is not ordered today. Personal review of the ekg ordered 04/08/22 shows sinus rhythm, diffuse T wave inversions, left atrial enlargement   Recent Labs: 03/24/2022: ALT 20; Magnesium 2.1 03/25/2022: TSH 0.567 04/08/2022: B Natriuretic Peptide 66.0; Hemoglobin 16.1; Platelets 124 04/11/2022: BUN 29; Creatinine, Ser 1.48; Potassium 5.3; Sodium 134    Lipid Panel     Component Value Date/Time   CHOL  09/16/2008 0417    138        ATP III CLASSIFICATION:  <200     mg/dL   Desirable  200-239  mg/dL   Borderline High  >=240    mg/dL   High          TRIG 36 09/16/2008 0417   HDL 27 (L) 09/16/2008 0417   CHOLHDL 5.1 09/16/2008 0417   VLDL 7 09/16/2008 0417   LDLCALC (H) 09/16/2008 0417    104        Total Cholesterol/HDL:CHD Risk Coronary Heart Disease Risk Table                     Men   Women  1/2 Average Risk   3.4   3.3  Average Risk       5.0   4.4  2 X Average Risk   9.6   7.1  3 X Average Risk  23.4   11.0        Use the calculated Patient Ratio above and the CHD Risk Table to determine the patient's CHD Risk.        ATP III  CLASSIFICATION (LDL):  <100     mg/dL   Optimal  100-129  mg/dL   Near or Above                    Optimal  130-159  mg/dL   Borderline  160-189  mg/dL   High  >190     mg/dL   Very High     Wt Readings from Last  3 Encounters:  04/21/22 240 lb (108.9 kg)  04/08/22 247 lb (112 kg)  03/29/22 243 lb 12.8 oz (110.6 kg)      Other studies Reviewed: Additional studies/ records that were reviewed today include: TTE 03/26/22  Review of the above records today demonstrates:   1. Left ventricular ejection fraction, by estimation, is 25 to 30%. The  left ventricle has severely decreased function. The left ventricle has no  regional wall motion abnormalities. There is moderate asymmetric left  ventricular hypertrophy of the  lateral segment. Left ventricular diastolic function could not be  evaluated. There is the interventricular septum is flattened in systole  and diastole, consistent with right ventricular pressure and volume  overload.   2. Right ventricular systolic function is moderately reduced. The right  ventricular size is moderately enlarged.   3. Left atrial size was severely dilated.   4. Right atrial size was severely dilated.   5. The mitral valve is abnormal. Severe mitral valve regurgitation. No  evidence of mitral stenosis.   6. Tricuspid valve regurgitation is mild to moderate.   7. The aortic valve is tricuspid. Aortic valve regurgitation is trivial.  No aortic stenosis is present.    ASSESSMENT AND PLAN:  1.  Persistent atrial fibrillation/atypical atrial flutter: Status post convergent ablation in 2011.  CHA2DS2-VASc of 3.  Currently on Xarelto.  He did not tolerate amiodarone.  He would benefit from a rhythm control strategy.  We discussed further options including ablation versus dofetilide.  Risk and benefits of ablation were discussed.  He would like to consider his options further and Sweet Jarvis let us know which she would prefer.  2.  Secondary hypercoagulable  state: Currently on Xarelto for atrial fibrillation as above  3.  Chronic systolic heart failure: Followed by heart failure clinic.  Currently on optimal medical therapy.  Now that he is in sinus rhythm, needs a repeat echo.  If ejection fraction remains low, would potentially benefit from ICD therapy.  Donie Lemelin plan for a rhythm control strategy for his atrial fibrillation as well.  Current medicines are reviewed at length with the patient today.   The patient does not have concerns regarding his medicines.  The following changes were made today:  none  Labs/ tests ordered today include:  No orders of the defined types were placed in this encounter.    Disposition:   FU with Takayla Baillie 3 months  Signed, Sumiya Mamaril Meredith Leeds, MD  04/21/2022 11:45 AM     [facility] [address] [place] [address] [phone] (office) [phone] (fax)

## 2022-04-21 NOTE — Patient Instructions (Addendum)
Medication Instructions:  Your physician recommends that you continue on your current medications as directed. Please refer to the Current Medication list given to you today.  *If you need a refill on your cardiac medications before your next appointment, please call your pharmacy*   Lab Work: None ordered   Testing/Procedures: None ordered   Follow-Up: At [facility], you and your health needs are our priority.  As part of our continuing mission to provide you with exceptional heart care, we have created designated Provider Care Teams.  These Care Teams include your primary Cardiologist (physician) and Advanced Practice Providers (APPs -  Physician Assistants and Nurse Practitioners) who all work together to provide you with the care you need, when you need it.  Your next appointment:   3 month(s)  The format for your next appointment:   In Person  Provider:   Allegra Lai, MD    Thank you for choosing [facility]!!   Trinidad Curet, RN [phone]  Other Instructions  Cardiac Ablation Cardiac ablation is a procedure to destroy (ablate) heart tissue that is sending bad signals. These bad signals cause the heart to beat very fast or in a way that is not normal. Destroying some tissues can help make the heart rhythm normal. Tell your doctor about: Any allergies you have. All medicines you are taking. These include vitamins, herbs, eye drops, creams, and over-the-counter medicines. Any problems you or family members have had with anesthesia. Any bleeding problems you have. Any surgeries you have had. Any medical conditions you have. Whether you are pregnant or may be pregnant. What are the risks? Your doctor will talk with you about risks. These may include: Infection. Bruising and bleeding. Stroke or blood clots. Damage to nearby areas of your body. Allergies to medicines or dyes. Needing a pacemaker if the heart gets damaged. A pacemaker helps the heart beat  normally. The procedure not working. What happens before the procedure? Medicines Ask your doctor about changing or stopping: Your normal medicines. Vitamins, herbs, and supplements. Over-the-counter medicines. Do not take aspirin or ibuprofen unless you are told to. General instructions Follow instructions from your doctor about what you may eat and drink. If you will be going home right after the procedure, plan to have a responsible adult: Take you home from the hospital or clinic. You will not be allowed to drive. Care for you for the time you are told. Ask your doctor what steps will be taken to prevent the spread of germs. What happens during the procedure?  An IV tube will be put into one of your veins. You may be given: A sedative. This helps you relax. Anesthesia. This will: Numb certain areas of your body. The skin on your neck or groin will be numbed. A cut (incision) will be made in your neck or groin. A needle will be put through the cut and into a large vein. The small, thin tube (catheter) will be put into the needle. The tube will be moved to your heart. A type of X-ray (fluoroscopy) will be used to help guide the tube. It will also show constant images of the heart on a screen. Dye may be put through the tube. This helps your doctor see your heart. An electric current will be sent from the tube to destroy heart tissue in certain areas. The tube will be taken out. Pressure will be held on your cut. This helps stop bleeding. A bandage (dressing) will be put over your cut.  The procedure may vary among doctors and hospitals. What happens after the procedure? You will be monitored until you leave the hospital or clinic. This includes checking your blood pressure, heart rate and rhythm, breathing rate, and blood oxygen level. Your cut will be checked for bleeding. You will need to lie still for a few hours. If your groin was used, you will need to keep your leg straight  for a few hours after the small, thin tube is removed. This information is not intended to replace advice given to you by your health care provider. Make sure you discuss any questions you have with your health care provider. Document Revised: 08/24/2021 Document Reviewed: 08/24/2021 Elsevier Patient Education  [place].    Dofetilide Capsules What is this medication? DOFETILIDE (doe FET il ide) treats a fast or irregular heartbeat (arrhythmia). It works by slowing down overactive electric signals in the heart, which stabilizes your heart rhythm. It belongs to a group of medications called antiarrhythmics. This medicine may be used for other purposes; ask your health care provider or pharmacist if you have questions. COMMON BRAND NAME(S): Tikosyn What should I tell my care team before I take this medication? They need to know if you have any of these conditions: Heart disease History of irregular heartbeat History of low levels of potassium or magnesium in the blood Kidney disease Liver disease An unusual or allergic reaction to dofetilide, other medications, foods, dyes, or preservatives Pregnant or trying to get pregnant Breast-feeding How should I use this medication? Take this medication by mouth with a glass of water. Follow the directions on the prescription label. Do not take with grapefruit juice. You can take it with or without food. If it upsets your stomach, take it with food. Take your medication at regular intervals. Do not take it more often than directed. Do not stop taking except on your care team's advice. A special MedGuide will be given to you by the pharmacist with each prescription and refill. Be sure to read this information carefully each time. Talk to your care team about the use of this medication in children. Special care may be needed. Overdosage: If you think you have taken too much of this medicine contact a poison control center or emergency room at  once. NOTE: This medicine is only for you. Do not share this medicine with others. What if I miss a dose? If you miss a dose, skip it. Take your next dose at the normal time. Do not take extra or 2 doses at the same time to make up for the missed dose. What may interact with this medication? Do not take this medication with any of the following: Cimetidine Cisapride Dolutegravir Dronedarone Erdafitinib Hydrochlorothiazide Ketoconazole Megestrol Pimozide Prochlorperazine Thioridazine Trimethoprim Verapamil This medication may also interact with the following: Amiloride Cannabinoids Certain antibiotics like erythromycin or clarithromycin Certain antiviral medications for HIV or hepatitis Certain medications for depression, anxiety, or psychotic disorders Digoxin Diltiazem Grapefruit juice Metformin Nefazodone Other medications that prolong the QT interval (an abnormal heart rhythm) Quinine Triamterene Zafirlukast Ziprasidone This list may not describe all possible interactions. Give your health care provider a list of all the medicines, herbs, non-prescription drugs, or dietary supplements you use. Also tell them if you smoke, drink alcohol, or use illegal drugs. Some items may interact with your medicine. What should I watch for while using this medication? Your condition will be monitored carefully while you are receiving this medication. What side effects may I notice  from receiving this medication? Side effects that you should report to your care team as soon as possible: Allergic reactions--skin rash, itching, hives, swelling of the face, lips, tongue, or throat Chest pain Heart rhythm changes--fast or irregular heartbeat, dizziness, feeling faint or lightheaded, chest pain, trouble breathing Side effects that usually do not require medical attention (report to your care team if they continue or are bothersome): Dizziness Headache Nausea Stomach pain Trouble  sleeping This list may not describe all possible side effects. Call your doctor for medical advice about side effects. You may report side effects to FDA at 1-800-FDA-1088. Where should I keep my medication? Keep out of the reach of children. Store at room temperature between 15 and 30 degrees C (59 and 86 degrees F). Throw away any unused medication after the expiration date. NOTE: This sheet is a summary. It may not cover all possible information. If you have questions about this medicine, talk to your doctor, pharmacist, or health care provider.  2023 Elsevier/Gold Standard (2021-01-12 00:00:00)    Dofetilide (Tikosyn) Admission  Prior to day of admission: Check with drug insurance company for cost of drug to ensure affordability --- Dofetilide 500 mcg twice a day.  GoodRx is an option if insurance copay is unaffordable.  A pharmacist will review all your medications for potential interactions with Tikosyn. If any medication changes are needed prior to admission we will be in touch with you.  If any new medications are started AFTER your admission date is set with [place] [phone]). Please notify our office immediately so your medication list can be updated and reviewed by our pharmacist again.  No Benadryl is allowed 3 days prior to admission.  Please ensure no missed doses of your anticoagulation (blood thinner) for 3 weeks prior to admission. If a dose is missed please notify our office immediately.  Tikosyn initiation requires a 3 night/4 day hospital stay with constant telemetry monitoring. You will have an EKG after each dose of Tikosyn as well as daily lab draws. On day of admission: Afib Clinic office visit on the morning of admission is needed for preliminary labs/ekg.  You may bring personal belongings/clothing with you to the hospital. Please leave your suitcase in the car until you arrive in admissions.  Time of admission is dependent on bed availability in the  hospital. In some instances, you will be sent home until bed is available. Rarely admission can be delayed to the following day if hospital census prevents available beds.  If the drug does not convert you to normal rhythm a cardioversion after the 4th dose of Tikosyn.  Questions please call our office at [phone]

## 2022-04-25 ENCOUNTER — Telehealth (HOSPITAL_COMMUNITY): Payer: Self-pay

## 2022-04-25 NOTE — Telephone Encounter (Signed)
Disability paper work faxed on 04/22/22 to [employer]. Patient aware

## 2022-05-03 ENCOUNTER — Telehealth (HOSPITAL_COMMUNITY): Payer: Self-pay | Admitting: Surgery

## 2022-05-03 ENCOUNTER — Ambulatory Visit (HOSPITAL_COMMUNITY)
Admission: RE | Admit: 2022-05-03 | Discharge: 2022-05-03 | Disposition: A | Discharge status: Home / Self Care | Payer: 59 | Source: Ambulatory Visit | Attending: Cardiology | Admitting: Cardiology

## 2022-05-03 ENCOUNTER — Other Ambulatory Visit (HOSPITAL_COMMUNITY): Payer: Self-pay

## 2022-05-03 VITALS — BP 128/84 | HR 67 | Resp 99

## 2022-05-03 DIAGNOSIS — E875 Hyperkalemia: Secondary | ICD-10-CM | POA: Diagnosis not present

## 2022-05-03 DIAGNOSIS — Z79899 Other long term (current) drug therapy: Secondary | ICD-10-CM | POA: Insufficient documentation

## 2022-05-03 DIAGNOSIS — Z7984 Long term (current) use of oral hypoglycemic drugs: Secondary | ICD-10-CM | POA: Diagnosis not present

## 2022-05-03 DIAGNOSIS — I5043 Acute on chronic combined systolic (congestive) and diastolic (congestive) heart failure: Secondary | ICD-10-CM

## 2022-05-03 DIAGNOSIS — I110 Hypertensive heart disease with heart failure: Secondary | ICD-10-CM | POA: Insufficient documentation

## 2022-05-03 DIAGNOSIS — I4892 Unspecified atrial flutter: Secondary | ICD-10-CM | POA: Diagnosis not present

## 2022-05-03 DIAGNOSIS — I5022 Chronic systolic (congestive) heart failure: Secondary | ICD-10-CM

## 2022-05-03 DIAGNOSIS — R0683 Snoring: Secondary | ICD-10-CM | POA: Diagnosis not present

## 2022-05-03 LAB — BASIC METABOLIC PANEL
Anion gap: 7 (ref 5–15)
BUN: 15 mg/dL (ref 8–23)
CO2: 31 mmol/L (ref 22–32)
Calcium: 9.2 mg/dL (ref 8.9–10.3)
Chloride: 99 mmol/L (ref 98–111)
Creatinine, Ser: 1.37 mg/dL — ABNORMAL HIGH (ref 0.61–1.24)
GFR, Estimated: 55 mL/min — ABNORMAL LOW (ref >60)
Glucose, Bld: 95 mg/dL (ref 70–99)
Potassium: 3.9 mmol/L (ref 3.5–5.1)
Sodium: 137 mmol/L (ref 135–145)

## 2022-05-03 LAB — BRAIN NATRIURETIC PEPTIDE: B Natriuretic Peptide: 94.4 pg/mL (ref 0.0–100.0)

## 2022-05-03 NOTE — Patient Instructions (Signed)
It was a pleasure seeing you today!  MEDICATIONS: -No medication changes today -Will call to make medication changes pending lab results -Call if you have questions about your medications.  LABS: -We will call you if your labs need attention.  NEXT APPOINTMENT: Will make follow up appointment based on labs today.  In general, to take care of your heart failure: -Limit your fluid intake to 2 Liters (half-gallon) per day.   -Limit your salt intake to ideally 2-3 grams (2000-3000 mg) per day. -Weigh yourself daily and record, and bring that "weight diary" to your next appointment.  (Weight gain of 2-3 pounds in 1 day typically means fluid weight.) -The medications for your heart are to help your heart and help you live longer.   -Please contact us before stopping any of your heart medications.  Call the clinic at [phone] with questions or to reschedule future appointments.

## 2022-05-03 NOTE — Telephone Encounter (Signed)
I called patient and left a message to let him know that he can proceed with ordered home sleep study as insurance pre cert is not required.

## 2022-05-03 NOTE — Progress Notes (Signed)
Advanced Heart Failure Clinic Note   PCP: Dr. Lucianne Lei HF Cardiologist: Dr. Haroldine Laws EP: Dr. Curt Bears  HPI:   Duane COLALUCA is a 72 y.o. male with a history of persistent afib (s/p ablation 2012, DCCV 09/12/17), HTN, and chronic systolic HF.    Admitted 09/14/17-09/18/17 from afib clinic with volume overload and AKI. HF team consulted. Echo showed EF 15-20%. Diuresed 29 lbs with IV Lasix, then transitioned to oral. HF medications optimized as able. AKI resolved with diuresis. DC weight: 263 lbs.    Seen 05/2018, volume up in setting of AFL with RVR. Taken to ED for diuresis and DCCV, converted to NSR. Unfortunately, lost to follow up.   He started taking testosterone 3 times a week 11/2021 and started noticing weight gain. He stopped testosterone injections around 02/2022. Weight has gone up at least 20 pounds.    At CHF clinic 03/24/22, he was found to be in A flutter w/ RVR and volume overloaded. Sent to the ED for evaluation. Admitted by cardiology and started on IV Lasix. Stable in ED so urgent cardioversion was not pursued. Placed on lopressor and heparin for AFL. Underwent TEE/DCCV. TEE showed EF 20-25% with severe central MR, DCCV successful to NSR. Became hypotensive post DCCV, and placed on NE. Amio gtt started to keep in NSR. Required ICU monitoring, PICC placed and CoOx followed. Echo (in NSR) showed EF 25-30%, RV moderately down, severe MR. Drips eventually weaned off, unable to tolerate amio 2/2 itching. GDMT titrated and he was discharged home, weight 243 lbs. EP discussed with him options for ablation vs Tikosyn and he is considering his options.   At last CHF visit on 04/08/22, he was overall feeling fine with no complaints. Entresto was increased to 49/51 mg BID. Follow up labs 04/08/22 showed K of 6 with AKI. Entresto, potassium, and spironolactone were held. He was prescribed Lokelma 10 g daily. Repeat labs 04/11/22 showed K of 5.3 and improved creatinine. Digoxin level during AKI  was 0.4.   Today he returns to HF clinic for pharmacist medication titration. Reports overall feeling pretty good since the last visit. Denies dizziness, lightheadedness, and fatigue. Reports he felt "foggy" around the time of his AKI, but that has now resolved. Denies chest pain and palpitations. Breathing has been fine, denies SOB with ADLs and stays moderately active throughout the day with the major limiting factor being his knee pain rather than SOB. Reports measuring weight at home every other day, which is usually ~240 pounds. Takes torsemide 20 mg daily. Denies LEE, may have mild edema on exam. Reports knees have occasional swelling from arthritis. Denies PND and orthopnea. Appetite is fair. Adheres to low-salt diet well. Has successfully stopped Entresto, spironolactone, and potassium as instructed.  HF Medications: Metoprolol succinate 25 mg daily Dapagliflozin 10 mg daily Digoxin 0.125 mg daily Torsemide 20 mg daily  Has the patient been experiencing any side effects to the medications prescribed?  No  Does the patient have any problems obtaining medications due to transportation or finances?   No  Understanding of regimen: good Understanding of indications: good Potential of compliance: excellent Patient understands to avoid NSAIDs. Patient understands to avoid decongestants.    Pertinent Lab Values: Serum creatinine 1.48, BUN 29, Potassium 5.3, Sodium 134, BNP 66 on 04/08/22, Digoxin on 04/08/22 0.4  BMET, BNP today pending  Vital Signs: Weight: 252.4 lbs (last clinic weight: 247 lbs) Blood pressure: 128/84 mmHg  Heart rate: 67   Assessment/Plan: 1. A flutter -  Had Afib ablation 2012 and DC-CV 2019 and 2020 - AFL with RVR 03/2022. - S/P TEE/DCCV 03/25/22.  - He has had prior intolerance to amio (itching), recurrence this admit w/ IV. Amio discontinued. - Continue metoprolol succinate 25 mg daily for rate control.  - Continue digoxin 0.125 mg daily. - Continue Xarelto 20  mg daily  - He has discussed w/ Dr. Curt Bears AFL ablation vs Tikosyn. He has not made a decision yet.   2. Chronic Systolic Heart Failure - EF 25% as far back as 2009.  - LHC (2012): normal coronaries. No ICD.  - Echo (08/2017): EF 15-20%, diffuse HK, mild to mod MR - Echo (12/2017) EF 45-50%, diffuse HK, mild MR, LA severely dilated, RA mildly dilated, RV normal - TEE (03/2022) w/ severely reduced LV function, suspect tachy-mediated from AFL; cardioverted to NSR.  - Echo in NSR (03/2022) showed EF 25-30%, moderate RV dysfunction/RV enlargement, severe MR, mild-moderate TR. Suspect drop in EF is tachy mediated from AFL. Symptoms have improved in NSR. - NYHA II today, euvolemic on exam. Weight is up from previous visit. Hypovolemia combined with an increase in Entresto dosing may have contributed to his AKI. BMET, BNP today pending.  - Continue torsemide 20 mg daily for now. Will consider decreasing pending labs. - Continue to hold Entresto. Will recheck BMET today and consider restarting based on lab results.  - Stop spironolactone 25 mg daily given recent hyperkalemia. - Continue Farxiga 10 mg daily. - Continue metoprolol XL 25 mg daily.  - Continue digoxin 0.125 mg daily. - BMET and BNP today. Will call after lab results to consider medication changes and schedule follow-up appointments. - Repeat echo 05/20/22 now that he is back in NSR.   3. 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    4. Snoring - Home sleep study has been arranged.   Follow up 06/13/22 with Dr. Haroldine Laws scheduled. Will schedule earlier follow up pending lab results today.   Audry Riles, PharmD, BCPS, BCCP, CPP Heart Failure Clinic Pharmacist [phone]

## 2022-05-04 ENCOUNTER — Telehealth (HOSPITAL_COMMUNITY): Payer: Self-pay | Admitting: Pharmacist

## 2022-05-04 ENCOUNTER — Other Ambulatory Visit (HOSPITAL_COMMUNITY): Payer: Self-pay

## 2022-05-04 ENCOUNTER — Telehealth (HOSPITAL_COMMUNITY): Payer: Self-pay

## 2022-05-04 MED ORDER — ENTRESTO 24-26 MG PO TABS: 1.0000 | ORAL_TABLET | Freq: Two times a day (BID) | ORAL | 3 refills | Status: DC

## 2022-05-04 NOTE — Telephone Encounter (Signed)
Spoke with patient via telephone. Labs improved. Restarting Enresto at reduced dose o 24/26 mg twice daily. Patient scheduled to come to clinic 05/09/22 at 1200 for follow up and repeat labs. He also inquired about his medical leave paperwork. Can address at that visit.

## 2022-05-04 NOTE — Telephone Encounter (Signed)
Advanced Heart Failure Patient Advocate Encounter  Prior authorization is required for [place]. PA submitted and APPROVED on 05/04/22. Test claim returns $0 copay for 30 days (plan limit 30 days)  Key [id number] Effective: 04/04/22 - 05/04/23  Clista Bernhardt, CPhT Rx Patient Advocate Phone: [phone]

## 2022-05-09 ENCOUNTER — Telehealth (HOSPITAL_COMMUNITY): Payer: Self-pay | Admitting: Pharmacist

## 2022-05-09 ENCOUNTER — Ambulatory Visit (HOSPITAL_COMMUNITY)
Admission: RE | Admit: 2022-05-09 | Discharge: 2022-05-09 | Disposition: A | Discharge status: Home / Self Care | Payer: 59 | Source: Ambulatory Visit | Attending: Cardiology | Admitting: Cardiology

## 2022-05-09 VITALS — BP 108/68 | HR 60 | Wt 253.6 lb

## 2022-05-09 DIAGNOSIS — I110 Hypertensive heart disease with heart failure: Secondary | ICD-10-CM | POA: Diagnosis not present

## 2022-05-09 DIAGNOSIS — Z7901 Long term (current) use of anticoagulants: Secondary | ICD-10-CM | POA: Insufficient documentation

## 2022-05-09 DIAGNOSIS — I4819 Other persistent atrial fibrillation: Secondary | ICD-10-CM | POA: Diagnosis not present

## 2022-05-09 DIAGNOSIS — R0683 Snoring: Secondary | ICD-10-CM | POA: Insufficient documentation

## 2022-05-09 DIAGNOSIS — I4892 Unspecified atrial flutter: Secondary | ICD-10-CM | POA: Diagnosis not present

## 2022-05-09 DIAGNOSIS — I5022 Chronic systolic (congestive) heart failure: Secondary | ICD-10-CM | POA: Diagnosis not present

## 2022-05-09 DIAGNOSIS — I5043 Acute on chronic combined systolic (congestive) and diastolic (congestive) heart failure: Secondary | ICD-10-CM

## 2022-05-09 LAB — BASIC METABOLIC PANEL
Anion gap: 6 (ref 5–15)
BUN: 15 mg/dL (ref 8–23)
CO2: 31 mmol/L (ref 22–32)
Calcium: 9.1 mg/dL (ref 8.9–10.3)
Chloride: 101 mmol/L (ref 98–111)
Creatinine, Ser: 1.41 mg/dL — ABNORMAL HIGH (ref 0.61–1.24)
GFR, Estimated: 53 mL/min — ABNORMAL LOW (ref >60)
Glucose, Bld: 96 mg/dL (ref 70–99)
Potassium: 4 mmol/L (ref 3.5–5.1)
Sodium: 138 mmol/L (ref 135–145)

## 2022-05-09 MED ORDER — TORSEMIDE 20 MG PO TABS: 10.0000 mg | ORAL_TABLET | Freq: Every day | ORAL | 5 refills | Status: DC

## 2022-05-09 NOTE — Progress Notes (Signed)
Advanced Heart Failure Clinic Note    PCP: Dr. Lucianne Lei HF Cardiologist: Dr. Haroldine Laws EP: Dr. Curt Bears   HPI:  [name] is a 72 y.o. male with a history of persistent afib (s/p ablation 2012, DCCV 09/12/17), HTN, and chronic systolic HF.    Admitted 09/14/17-09/18/17 from afib clinic with volume overload and AKI. HF team consulted. Echo showed EF 15-20%. Diuresed 29 lbs with IV Lasix, then transitioned to oral. HF medications optimized as able. AKI resolved with diuresis. DC weight: 263 lbs.    Seen 05/2018, volume up in setting of AFL with RVR. Taken to ED for diuresis and DCCV, converted to NSR. Unfortunately, lost to follow up.   He started taking testosterone 3 times a week 11/2021 and started noticing weight gain. He stopped testosterone injections around 02/2022. Weight has gone up at least 20 pounds.    At CHF clinic 03/24/22, he was found to be in A flutter w/ RVR and volume overloaded. Sent to the ED for evaluation. Admitted by cardiology and started on IV Lasix. Stable in ED so urgent cardioversion was not pursued. Placed on lopressor and heparin for AFL. Underwent TEE/DCCV. TEE showed EF 20-25% with severe central MR, DCCV successful to NSR. Became hypotensive post DCCV, and placed on NE. Amio gtt started to keep in NSR. Required ICU monitoring, PICC placed and CoOx followed. Echo (in NSR) showed EF 25-30%, RV moderately down, severe MR. Drips eventually weaned off, unable to tolerate amio 2/2 itching. GDMT titrated and he was discharged home, weight 243 lbs. EP discussed with him options for ablation vs Tikosyn and he is considering his options.   At last CHF visit on 04/08/22, he was overall feeling fine with no complaints. Entresto was increased to 49/51 mg BID. Follow up labs 04/08/22 showed K of 6 with AKI. Entresto, potassium, and spironolactone were held. He was prescribed Lokelma 10 g x1 dose. Repeat labs 04/11/22 showed K of 5.3 and improved creatinine. Digoxin level during AKI  was 0.4. AKI likely secondary to dehydration. Labs were improved on 05/03/22, so Delene Loll was restarted at 24/26 mg BID.   Today he returns to HF clinic for pharmacist medication titration. Reports overall feeling well with no change since the last visit. Denies dizziness, lightheadedness, and fatigue. Denies chest pain and palpitations. Breathing has been great. Denies SOB with ADLs and stays moderately active throughout the day. Suspect patient does not regularly weigh at home. Takes torsemide 20 mg daily. No LEE. Reports knees have occasional swelling from arthritis. Denies PND and orthopnea. Appetite is great. Adheres to low-salt diet well.    HF Medications: Metoprolol succinate 25 mg daily Entresto 24/26 mg BID Dapagliflozin 10 mg daily Digoxin 0.125 mg daily Torsemide 20 mg daily   Has the patient been experiencing any side effects to the medications prescribed?  No   Does the patient have any problems obtaining medications due to transportation or finances?   No   Understanding of regimen: good Understanding of indications: good Potential of compliance: excellent Patient understands to avoid NSAIDs. Patient understands to avoid decongestants.     Pertinent Lab Values: 05/03/22: Serum creatinine 1.37, BUN 15, Potassium 3.9, Sodium 137, BNP 94.4 on 04/08/22, Digoxin on 04/08/22 0.4  BMET today pending   Vital Signs: Weight: 253.6 lbs (last clinic weight: 252.4 lbs) Blood pressure: 108/68 mmHg  Heart rate: 60 bpm    Assessment/Plan: 1. A flutter - Had Afib ablation 2012 and DC-CV 2019 and 2020 - AFL with RVR  03/2022. - S/P TEE/DCCV 03/25/22.  - He has had prior intolerance to amio (itching), recurrence this admit w/ IV. Amio discontinued. - Continue metoprolol succinate 25 mg daily for rate control.  - Continue digoxin 0.125 mg daily. - Continue Xarelto 20 mg daily  - He has discussed w/ Dr. Curt Bears AFL ablation vs Tikosyn. He has not made a decision yet.   2. Chronic Systolic  Heart Failure - EF 25% as far back as 2009.  - LHC (2012): normal coronaries. No ICD.  - Echo (08/2017): EF 15-20%, diffuse HK, mild to mod MR - Echo (12/2017) EF 45-50%, diffuse HK, mild MR, LA severely dilated, RA mildly dilated, RV normal - TEE (03/2022) w/ severely reduced LV function, suspect tachy-mediated from AFL; cardioverted to NSR.  - Echo in NSR (03/2022) showed EF 25-30%, moderate RV dysfunction/RV enlargement, severe MR, mild-moderate TR. Suspect drop in EF is tachy mediated from AFL. Symptoms have improved in NSR. - NYHA II today, euvolemic on exam. Weight is up 1 lb from previous visit. Reports urine is the color of "pale ale." Hypovolemia combined with an increase in Entresto dosing may have contributed to his recent AKI. Last 2 BNPs have been WNL. BMET today pending.  - Decrease torsemide to 10 mg daily for now. Will consider changing to prn pending labs. - Continue metoprolol XL 25 mg daily.  - Continue Entresto 24/26 mg BID. Will recheck BMET today. - No spironolactone given recent hyperkalemia. - Continue Farxiga 10 mg daily. - Continue digoxin 0.125 mg daily. - BMET today. Will call after lab results to consider medication changes and schedule follow-up appointments. - Repeat echo 05/20/22 now that he is back in NSR.   3. 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    4. Snoring - Home sleep study has been ordered. Pt plans to call this week to arrange a time.   Follow up 06/13/22 with Dr. Haroldine Laws as scheduled.    Audry Riles, PharmD, BCPS, BCCP, CPP Heart Failure Clinic Pharmacist [phone]

## 2022-05-09 NOTE — Patient Instructions (Signed)
It was a pleasure seeing you today!  MEDICATIONS: -We are changing your medications today -Decrease torsemide to 10 mg (1/2 tablet) daily. -Call if you have questions about your medications.  LABS: -We will call you if your labs need attention.  NEXT APPOINTMENT: Return to clinic on 06/13/22 with Dr. Haroldine Laws.  In general, to take care of your heart failure: -Limit your fluid intake to 2 Liters (half-gallon) per day.   -Limit your salt intake to ideally 2-3 grams (2000-3000 mg) per day. -Weigh yourself daily and record, and bring that "weight diary" to your next appointment.  (Weight gain of 2-3 pounds in 1 day typically means fluid weight.) -The medications for your heart are to help your heart and help you live longer.   -Please contact us before stopping any of your heart medications.  Call the clinic at [phone] with questions or to reschedule future appointments.

## 2022-05-20 ENCOUNTER — Other Ambulatory Visit (HOSPITAL_COMMUNITY): Payer: Self-pay | Admitting: Internal Medicine

## 2022-05-20 ENCOUNTER — Ambulatory Visit (HOSPITAL_COMMUNITY)
Admission: RE | Admit: 2022-05-20 | Discharge: 2022-05-20 | Disposition: A | Discharge status: Home / Self Care | Payer: 59 | Source: Ambulatory Visit | Attending: Family Medicine | Admitting: Family Medicine

## 2022-05-20 DIAGNOSIS — I340 Nonrheumatic mitral (valve) insufficiency: Secondary | ICD-10-CM

## 2022-05-20 DIAGNOSIS — I5023 Acute on chronic systolic (congestive) heart failure: Secondary | ICD-10-CM

## 2022-05-20 DIAGNOSIS — R0683 Snoring: Secondary | ICD-10-CM

## 2022-05-20 DIAGNOSIS — I493 Ventricular premature depolarization: Secondary | ICD-10-CM | POA: Insufficient documentation

## 2022-05-20 DIAGNOSIS — I5022 Chronic systolic (congestive) heart failure: Secondary | ICD-10-CM

## 2022-05-20 DIAGNOSIS — I4892 Unspecified atrial flutter: Secondary | ICD-10-CM

## 2022-05-20 LAB — ECHOCARDIOGRAM LIMITED
AR max vel: 3.36 cm2
AV Peak grad: 8.9 mmHg
Ao pk vel: 1.49 m/s
Area-P 1/2: 2.73 cm2
Calc EF: 47.4 %
MV M vel: 4.63 m/s
MV Peak grad: 85.7 mmHg
Radius: 0.4 cm
S' Lateral: 4.7 cm
Single Plane A2C EF: 44.3 %
Single Plane A4C EF: 50 %

## 2022-05-20 NOTE — Progress Notes (Signed)
  Echocardiogram 2D Echocardiogram has been performed.  [name] 05/20/2022, 3:24 PM

## 2022-06-08 ENCOUNTER — Telehealth (HOSPITAL_COMMUNITY): Payer: Self-pay

## 2022-06-08 ENCOUNTER — Telehealth: Payer: Self-pay | Admitting: *Deleted

## 2022-06-08 NOTE — Telephone Encounter (Signed)
Followed up with pt on information given to him at last month's OV w/ Dr. Curt Bears. Pt reports he wants to talk w/ Dr. Haroldine Laws about things before making a decision, but does state he is not interested in ablation procedure. He would like to keep follow up with Dr. Curt Bears scheduled in May for now and will office if wants to do something sooner. He appreciates the follow up call.

## 2022-06-08 NOTE — Telephone Encounter (Signed)
Forms awaiting MD signature

## 2022-06-09 ENCOUNTER — Telehealth (HOSPITAL_COMMUNITY): Payer: Self-pay

## 2022-06-09 NOTE — Telephone Encounter (Signed)
Doubt this is related to torsemide. If symptoms continues or worsens, he should check in with his PCP.

## 2022-06-09 NOTE — Telephone Encounter (Signed)
Patient called and states he is having numbness in his right foot, the last 2 toes, radiating up into his ankle. He says it feels "weird" and he is concerned it is a side effect from Torsemide. He denies any other symptoms other than numbness and states it started when he began his Torsemide. Please advise.

## 2022-06-09 NOTE — Telephone Encounter (Signed)
I spoke with patient and informed him to call PCP to see what could be causing this.

## 2022-06-13 ENCOUNTER — Encounter (HOSPITAL_COMMUNITY): Payer: Self-pay

## 2022-06-13 ENCOUNTER — Ambulatory Visit (HOSPITAL_BASED_OUTPATIENT_CLINIC_OR_DEPARTMENT_OTHER)
Admission: RE | Admit: 2022-06-13 | Discharge: 2022-06-13 | Disposition: A | Discharge status: Home / Self Care | Payer: 59 | Source: Ambulatory Visit | Attending: Internal Medicine | Admitting: Internal Medicine

## 2022-06-13 ENCOUNTER — Encounter (HOSPITAL_COMMUNITY): Payer: Self-pay | Admitting: Internal Medicine

## 2022-06-13 VITALS — BP 102/60 | HR 106 | Wt 265.8 lb

## 2022-06-13 DIAGNOSIS — I5023 Acute on chronic systolic (congestive) heart failure: Secondary | ICD-10-CM | POA: Insufficient documentation

## 2022-06-13 DIAGNOSIS — I10 Essential (primary) hypertension: Secondary | ICD-10-CM | POA: Diagnosis not present

## 2022-06-13 DIAGNOSIS — I110 Hypertensive heart disease with heart failure: Secondary | ICD-10-CM | POA: Insufficient documentation

## 2022-06-13 DIAGNOSIS — R0683 Snoring: Secondary | ICD-10-CM | POA: Insufficient documentation

## 2022-06-13 DIAGNOSIS — I428 Other cardiomyopathies: Secondary | ICD-10-CM | POA: Insufficient documentation

## 2022-06-13 DIAGNOSIS — I5043 Acute on chronic combined systolic (congestive) and diastolic (congestive) heart failure: Secondary | ICD-10-CM

## 2022-06-13 DIAGNOSIS — Z7901 Long term (current) use of anticoagulants: Secondary | ICD-10-CM | POA: Insufficient documentation

## 2022-06-13 DIAGNOSIS — Z79899 Other long term (current) drug therapy: Secondary | ICD-10-CM | POA: Insufficient documentation

## 2022-06-13 DIAGNOSIS — I340 Nonrheumatic mitral (valve) insufficiency: Secondary | ICD-10-CM | POA: Insufficient documentation

## 2022-06-13 DIAGNOSIS — I5022 Chronic systolic (congestive) heart failure: Secondary | ICD-10-CM | POA: Diagnosis not present

## 2022-06-13 DIAGNOSIS — N183 Chronic kidney disease, stage 3 unspecified: Secondary | ICD-10-CM | POA: Diagnosis not present

## 2022-06-13 DIAGNOSIS — I484 Atypical atrial flutter: Secondary | ICD-10-CM | POA: Insufficient documentation

## 2022-06-13 DIAGNOSIS — I4819 Other persistent atrial fibrillation: Secondary | ICD-10-CM | POA: Insufficient documentation

## 2022-06-13 NOTE — Patient Instructions (Signed)
CHANGE Torsemide to 40 mg daily.  The admitting office will call you tomorrow to let you know when your bed is ready.  If you have any questions or concerns before your next appointment please send [place] a message through [place] or call our office at [phone].    TO LEAVE A MESSAGE FOR THE NURSE SELECT OPTION 2, PLEASE LEAVE A MESSAGE INCLUDING: YOUR NAME DATE OF BIRTH CALL BACK NUMBER REASON FOR CALL**this is important as we prioritize the call backs  YOU WILL RECEIVE A CALL BACK THE SAME DAY AS LONG AS YOU CALL BEFORE 4:00 PM  At the [facility], you and your health needs are our priority. As part of our continuing mission to provide you with exceptional heart care, we have created designated Provider Care Teams. These Care Teams include your primary Cardiologist (physician) and Advanced Practice Providers (APPs- Physician Assistants and Nurse Practitioners) who all work together to provide you with the care you need, when you need it.   You may see any of the following providers on your designated Care Team at your next follow up: Dr Glori Bickers Dr Loralie Champagne Dr. Roxana Hires, NP Lyda Jester, [place] [facility], [place] Forestine Na, NP Audry Riles, PharmD   Please be sure to bring in all your medications bottles to every appointment.    Thank you for choosing [facility]

## 2022-06-13 NOTE — Progress Notes (Signed)
Advanced Heart Failure Clinic Note   PCP: Lucianne Lei, MD HF Cardiologist: Dr Haroldine Laws EP: Curt Bears  HPI: [name] is a 72 y.o. male with persistent afib (s/p ablation 2012, DCCV 09/12/17), HTN, and chronic systolic HF due to NICM.   Underwent DC-CV of AF on 09/12/17.   Admitted 6/27-09/18/17 from afib clinic with volume overload and AKI. Echo EF 15-20% HF team consulted. Echo showed EF 15-20%.  In 05/2018, volume up in setting of AFL with RVR. Taken to ED for diuresis and DCCV, converted to NSR. Unfortunately, lost to follow up.   Seen in HF clinic 03/24/22, and was found to be in A flutter RVR and volume overloaded. Admitted. Underwent TEE/DCCV. TEE showed EF 20-25% with severe central MR, DCCV successful to NSR. Became hypotensive post DCCV, and placed on NE. Amio gtt started. TTE EF 25-30%, RV moderately down, severe MR. Drips eventually weaned off, unable to tolerate amio 2/2 itching. EP planning to discuss ablation outpatient. GDMT titrated and he was discharged home, weight 243 lbs.   Saw Dr. Curt Bears in 2/24. Ablation offered (vs Tikosyn) but he decided to defer,   Echo 05/20/22: EF 45-50% mild MR  Today he returns for f/u. Says he feels great, a bit bored as he isn't working.No CP or SOB. Has gained 25 in 2 months but denies edema . Says he is not very active. Also stopped taking testosterone shots. Torsemide recently cut back to 1/2 tablet daily. Denies palpitations  Cardiac Studies:  - TEE/DCCV (1/24): LVEF 20-25% w/ diffuse HK, RV mildly dilated w/ moderate systolic dysfunction, severe BAE w/ severe central mitral regurgitation, no LA thrombus.   - Echo (1/24): EF 25-30%, moderate asymmetric LVH, RV moderately reduced, severe MR, mild to moderate TR  - 14-day monitor (12/19): mostly NSR average HR 74 bpm, one run of NSVT lasting 4 beats, atrial fibrillation (2% burden), ranging from 68-195 bpm (avg of 116 bpm), the longest lasting 4 hours 34 mins with an avg rate of 115 bpm,  rare PVCs.   - Echo (10/19): EF 45-50%, diffuse HK, mild MR, LA severely dilated, RA mildly dilated, RV normal  - TEE (6/19): EF 15-20%, diffuse HK, mild to mod MR, RV systolic function reduced, RA and LA dilated  - LHC (3/12): 1. Normal coronaries, mild global LV dysfunction, most likely AFib mediated. 2. No mitral regurgitation.  Review of systems complete and found to be negative unless listed in HPI.   Past Medical History:  Diagnosis Date   Atrial tachycardia    Chronic HFrEF (heart failure with reduced ejection fraction) (HCC)    Hypertension    Mitral regurgitation    NICM (nonischemic cardiomyopathy) ([place])    a. 2009 EF 25%; b. 05/2010 Cath: nl cors. EF 45%; c. 12/2017 Echo: EF 45-50%, diff HK, mild MR, sev dil LA.   Paroxysmal atrial flutter ([place])    a. Dx in 05/2018.   Persistent atrial fibrillation ([place])    a. 2012 s/p RFCA; b. 08/2017 s/p DCCV.   Current Outpatient Medications  Medication Sig Dispense Refill   dapagliflozin propanediol (FARXIGA) 10 MG TABS tablet Take 1 tablet (10 mg total) by mouth daily. 30 tablet 5   digoxin (LANOXIN) 0.125 MG tablet Take 1 tablet (0.125 mg total) by mouth daily. 30 tablet 5   metoprolol succinate (TOPROL-XL) 25 MG 24 hr tablet Take 1 tablet (25 mg total) by mouth daily. 30 tablet 5   OVER THE COUNTER MEDICATION Take 5 mLs by mouth in  the morning. Black seed oil.     rivaroxaban (XARELTO) 20 MG TABS tablet Take 1 tablet (20 mg total) by mouth daily with supper. 30 tablet 5   sacubitril-valsartan (ENTRESTO) 24-26 MG Take 1 tablet by mouth 2 (two) times daily. 60 tablet 3   sildenafil (VIAGRA) 25 MG tablet Take 1 tablet (25 mg total) by mouth daily as needed for erectile dysfunction. 10 tablet 0   torsemide (DEMADEX) 20 MG tablet Take 0.5 tablets (10 mg total) by mouth daily. 30 tablet 5   No current facility-administered medications for this encounter.   Allergies  Allergen Reactions   Pacerone [Amiodarone] Itching and Rash    Social History   Socioeconomic History   Marital status: Widowed    Spouse name: Not on file   Number of children: Not on file   Years of education: Not on file   Highest education level: Not on file  Occupational History   Not on file  Tobacco Use   Smoking status: Never   Smokeless tobacco: Never  Vaping Use   Vaping Use: Never used  Substance and Sexual Activity   Alcohol use: Yes    Comment: rare   Drug use: Never   Sexual activity: Not on file  Other Topics Concern   Not on file  Social History Narrative   Not on file   Social Determinants of Health   Financial Resource Strain: Not on file  Food Insecurity: No Food Insecurity (03/25/2022)   Hunger Vital Sign    Worried About Running Out of Food in the Last Year: Never true    Ran Out of Food in the Last Year: Never true  Transportation Needs: No Transportation Needs (03/25/2022)   PRAPARE - Hydrologist (Medical): No    Lack of Transportation (Non-Medical): No  Physical Activity: Not on file  Stress: Not on file  Social Connections: Not on file  Intimate Partner Violence: Not At Risk (03/25/2022)   Humiliation, Afraid, Rape, and Kick questionnaire    Fear of Current or Ex-Partner: No    Emotionally Abused: No    Physically Abused: No    Sexually Abused: No   BP 102/60   Pulse (!) 106   Wt 120.6 kg (265 lb 12.8 oz)   SpO2 96%   BMI 32.35 kg/m   Wt Readings from Last 3 Encounters:  06/13/22 120.6 kg (265 lb 12.8 oz)  05/09/22 115 kg (253 lb 9.6 oz)  04/21/22 108.9 kg (240 lb)   PHYSICAL EXAM: General:  Well appearing. No resp difficulty HEENT: normal Neck: supple. JVP to jaw . Carotids 2+ bilat; no bruits. No lymphadenopathy or thryomegaly appreciated. Cor: PMI nondisplaced. Regular tachy No rubs, gallops or murmurs. Lungs: clear Abdomen: obese soft, nontender, nondistended. No hepatosplenomegaly. No bruits or masses. Good bowel sounds. Extremities: no cyanosis, clubbing,  rash, 2-3+ edema Neuro: alert & orientedx3, cranial nerves grossly intact. moves all 4 extremities w/o difficulty. Affect pleasant  ECG: Atypical AFL 114 Personally reviewed   ASSESSMENT & PLAN:  1. Acute on chronic Systolic Heart Failure - EF 25% as far back as 2009.  - LHC (2012): normal coronaries  - Echo (09/12/17): EF 15-20%, diffuse HK, mild to mod MR - Echo (10/19) EF 45-50%, diffuse HK, mild MR, LA severely dilated, RA mildly dilated, RV normal - TEE 1/24 w/ severely reduced LV function, suspect tachy-mediated from AFL; cardioverted to NSR.  - Echo 1/24 in NSR EF 25-30%, mod RVHK, severe  MR, mild-mod TR. - Echo 05/20/22: EF 45-50% mild MR - EF has improved and he is now NYHA I-II but has marked volume overload in setting of recurrent AFL. He is at high risk for recurrent LV dysfunction .  - Continue Entresto 49/51 mg bid. - Continue Farxiga 10 mg daily. No GU symptoms. - Continue spironolactone 25 mg  - Continue Toprol XL 25 mg daily.  - I spoke with Dr. Curt Bears and we agreed that he needs restoration of NSR to prevent recurrent tachy-induced CM. Will admit for IV diuresis and probable Tikosyn load followed by possible DC-CV. EP will see in hospital to assist. Will then arrange repeat outpatient AF/AFL ablation  - Would consider cMRI while in house  2. A flutter, atypica  - Had Afib ablation 2012 and DC-CV 2019 and 2020 - AFL with RVR 03/2022. - S/P TEE/DCCV 03/25/22.  - Intolerant of amio (itching) - Continue metoprolol succinate 25 mg daily for rate control.  - Continue digoxin 0.125 mg daily. - Continue Xarelto 20 mg daily  - He saw Dr. Curt Bears in 2/24. Ablation offered but he decided to defer,  - Now back in AFL (atypical) with RVR. Plan as above   3. 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   4. Snoring - Needs sleep study  Total time spent 45 minutes. Over half that time spent discussing above.     Glori Bickers, MD  3:36 PM

## 2022-06-13 NOTE — Telephone Encounter (Signed)
Forms faxed and received conformation of Delivery. Patient informed

## 2022-06-13 NOTE — Progress Notes (Signed)
ReDS Vest / Clip - 06/13/22 1500       ReDS Vest / Clip   Station Marker D    Ruler Value 32    ReDS Value Range Moderate volume overload    ReDS Actual Value 36

## 2022-06-13 NOTE — H&P (Incomplete)
Advanced Heart Failure Team History and Physical Note   PCP:  Lucianne Lei, MD  PCP-Cardiology: Glori Bickers, MD     Reason for Admission:    HPI:   [name] is a 72 y.o. male with persistent afib (s/p ablation 2012, DCCV 09/12/17), HTN, and chronic systolic HF due to NICM.    Underwent DC-CV of AF on 09/12/17.    Admitted 6/27-09/18/17 from afib clinic with volume overload and AKI. Echo EF 15-20% HF team consulted. Echo showed EF 15-20%.   In 05/2018, volume up in setting of AFL with RVR. Taken to ED for diuresis and DCCV, converted to NSR. Unfortunately, lost to follow up.  Admitted from clinic 1/24 for A/C HFrEF and atrial flutter with RVR. Diuresed and underwent successful TEE-DCCV to NSR. Plan for see EP for OP ablation.   Saw Dr. Curt Bears 2/24, ablation offered, he decided to defer.   Saw in AHF clinic 3/24, volume overloaded (up 25 lbs) and found to be in atrial flutter with RVR again, asymptomatic. Diuretics increased and planned for next day admission.    Cardiac studies reviewed:  - TEE/DCCV (1/24): LVEF 20-25% w/ diffuse HK, RV mildly dilated w/ moderate systolic dysfunction, severe BAE w/ severe central mitral regurgitation, no LA thrombus.  - Echo (1/24): EF 25-30%, moderate asymmetric LVH, RV moderately reduced, severe MR, mild to moderate TR - 14-day monitor (12/19): mostly NSR average HR 74 bpm, one run of NSVT lasting 4 beats, atrial fibrillation (2% burden), ranging from 68-195 bpm (avg of 116 bpm), the longest lasting 4 hours 34 mins with an avg rate of 115 bpm, rare PVCs.  - Echo (10/19): EF 45-50%, diffuse HK, mild MR, LA severely dilated, RA mildly dilated, RV normal - TEE (6/19): EF 15-20%, diffuse HK, mild to mod MR, RV systolic function reduced, RA and LA dilated - LHC (3/12): Normal coronaries, mild global LV dysfunction, most likely AFib mediated. No mitral regurgitation.   Home Medications Prior to Admission medications   Medication Sig Start  Date End Date Taking? Authorizing Provider  dapagliflozin propanediol (FARXIGA) 10 MG TABS tablet Take 1 tablet (10 mg total) by mouth daily. 04/13/22   [place], Maricela Bo, FNP  digoxin (LANOXIN) 0.125 MG tablet Take 1 tablet (0.125 mg total) by mouth daily. 03/30/22   Lyda Jester M, PA-C  metoprolol succinate (TOPROL-XL) 25 MG 24 hr tablet Take 1 tablet (25 mg total) by mouth daily. 03/30/22   Simmons, Brittainy M, PA-C  OVER THE COUNTER MEDICATION Take 5 mLs by mouth in the morning. Black seed oil.    [provider]  rivaroxaban (XARELTO) 20 MG TABS tablet Take 1 tablet (20 mg total) by mouth daily with supper. 03/29/22   Lyda Jester M, PA-C  sacubitril-valsartan (ENTRESTO) 24-26 MG Take 1 tablet by mouth 2 (two) times daily. 05/04/22   Bensimhon, Shaune Pascal, MD  sildenafil (VIAGRA) 25 MG tablet Take 1 tablet (25 mg total) by mouth daily as needed for erectile dysfunction. 01/02/18   Bensimhon, Shaune Pascal, MD  torsemide (DEMADEX) 20 MG tablet Take 0.5 tablets (10 mg total) by mouth daily. 05/09/22   Hebert Soho, DO    Past Medical History: Past Medical History:  Diagnosis Date   Atrial tachycardia    Chronic HFrEF (heart failure with reduced ejection fraction) ([place])    Hypertension    Mitral regurgitation    NICM (nonischemic cardiomyopathy) ([place])    a. 2009 EF 25%; b. 05/2010 Cath: nl cors. EF 45%;  c. 12/2017 Echo: EF 45-50%, diff HK, mild MR, sev dil LA.   Paroxysmal atrial flutter ([place])    a. Dx in 05/2018.   Persistent atrial fibrillation ([place])    a. 2012 s/p RFCA; b. 08/2017 s/p DCCV.    Past Surgical History: Past Surgical History:  Procedure Laterality Date   CARDIAC ELECTROPHYSIOLOGY STUDY AND ABLATION  2012   Dr. Viann Shove   CARDIOVERSION N/A 09/12/2017   Procedure: CARDIOVERSION;  Surgeon: Jerline Pain, MD;  Location: [facility] ENDOSCOPY;  Service: Cardiovascular;  Laterality: N/A;   CARDIOVERSION N/A 03/25/2022   Procedure: CARDIOVERSION;  Surgeon: Larey Dresser, MD;  Location: [facility] ENDOSCOPY;  Service: Cardiovascular;  Laterality: N/A;   LOOP RECORDER IMPLANT  06/11/2010   TEE WITHOUT CARDIOVERSION N/A 09/12/2017   Procedure: TRANSESOPHAGEAL ECHOCARDIOGRAM (TEE);  Surgeon: Jerline Pain, MD;  Location: [facility] ENDOSCOPY;  Service: Cardiovascular;  Laterality: N/A;   TEE WITHOUT CARDIOVERSION N/A 03/25/2022   Procedure: TRANSESOPHAGEAL ECHOCARDIOGRAM (TEE);  Surgeon: Larey Dresser, MD;  Location: [facility] ENDOSCOPY;  Service: Cardiovascular;  Laterality: N/A;    Family History: *** No family history on file.  Social History: Social History   Socioeconomic History   Marital status: Widowed    Spouse name: Not on file   Number of children: Not on file   Years of education: Not on file   Highest education level: Not on file  Occupational History   Not on file  Tobacco Use   Smoking status: Never   Smokeless tobacco: Never  Vaping Use   Vaping Use: Never used  Substance and Sexual Activity   Alcohol use: Yes    Comment: rare   Drug use: Never   Sexual activity: Not on file  Other Topics Concern   Not on file  Social History Narrative   Not on file   Social Determinants of Health   Financial Resource Strain: Not on file  Food Insecurity: No Food Insecurity (03/25/2022)   Hunger Vital Sign    Worried About Running Out of Food in the Last Year: Never true    Ran Out of Food in the Last Year: Never true  Transportation Needs: No Transportation Needs (03/25/2022)   PRAPARE - Hydrologist (Medical): No    Lack of Transportation (Non-Medical): No  Physical Activity: Not on file  Stress: Not on file  Social Connections: Not on file    Allergies:  Allergies  Allergen Reactions   Pacerone [Amiodarone] Itching and Rash    Objective:    Vital Signs:   BP: ()/()  Arterial Line BP: ()/()    There were no vitals filed for this visit.   Physical Exam     General:  *** appearing.  No respiratory  difficulty HEENT: normal Neck: supple. JVD *** cm. Carotids 2+ bilat; no bruits. No lymphadenopathy or thyromegaly appreciated. Cor: PMI nondisplaced. Regular rate & rhythm. No rubs, gallops or murmurs. Lungs: clear Abdomen: soft, nontender, nondistended. No hepatosplenomegaly. No bruits or masses. Good bowel sounds. Extremities: no cyanosis, clubbing, rash, edema  Neuro: alert & oriented x 3, cranial nerves grossly intact. moves all 4 extremities w/o difficulty. Affect pleasant.    Telemetry   ***  EKG   ***  Labs     Basic Metabolic Panel: No results for input(s): "NA", "K", "CL", "CO2", "GLUCOSE", "BUN", "CREATININE", "CALCIUM", "MG", "PHOS" in the last 168 hours.  Liver Function Tests: No results for input(s): "AST", "ALT", "ALKPHOS", "BILITOT", "PROT", "ALBUMIN"  in the last 168 hours. No results for input(s): "LIPASE", "AMYLASE" in the last 168 hours. No results for input(s): "AMMONIA" in the last 168 hours.  CBC: No results for input(s): "WBC", "NEUTROABS", "HGB", "HCT", "MCV", "PLT" in the last 168 hours.  Cardiac Enzymes: No results for input(s): "CKTOTAL", "CKMB", "CKMBINDEX", "TROPONINI" in the last 168 hours.  BNP: BNP (last 3 results) Recent Labs    03/24/22 1144 04/08/22 1532 05/03/22 1429  BNP 287.5* 66.0 94.4    ProBNP (last 3 results) No results for input(s): "PROBNP" in the last 8760 hours.   CBG: No results for input(s): "GLUCAP" in the last 168 hours.  Coagulation Studies: No results for input(s): "LABPROT", "INR" in the last 72 hours.  Imaging: No results found.   Patient Profile  [name] is a 72 y.o. male with persistent afib (s/p ablation 2012, DCCV 09/12/17), HTN, and chronic systolic HF due to NICM.  Admitted day after AHF clinic appointment for HFrEF and atrial flutter with RVR.   Assessment/Plan  Acute on chronic systolic heart failure, NICM - Echo (1/24): EF 25-30%, moderate asymmetric LVH, RV moderately reduced, severe  MR, mild to moderate TR - LHC (2012): normal coronaries. No ICD.  - NYHA II on admission - Volume elevated, increased diuretics this morning, *** - Continue entresto 49/51 mg BID - Continue Farxiga 10 mg daily - Continue ospironolactone 25 mg daily - Continue metoprolol XL 25 mg daily - check labs, CMET, BNP, CBC - strict I&O, daily weights Atrial flutter with RVR - Had Afib ablation 2012 and DC-CV 2019, 2020 and 2024 - EKG *** - intolerant of amio (itching) - Saw Dr. Curt Bears 2/24. Ablation offered but he decided to defer  - EP consulted, plan to load Tikosyn Severe MR -  MR is mild to moderate on echo 05/20/22  HTN   Earnie Larsson, NP 06/13/2022, 4:40 PM  Advanced Heart Failure Team Pager [phone] (M-F; 7a - 5p)  Please contact [facility] for night-coverage after hours (4p -7a ) and weekends on [email or web address]

## 2022-06-14 ENCOUNTER — Inpatient Hospital Stay (HOSPITAL_COMMUNITY)
Admission: AD | Admit: 2022-06-14 | Discharge: 2022-06-20 | DRG: 291 | Disposition: A | Discharge status: Home / Self Care | Payer: 59 | Source: Other Acute Inpatient Hospital | Attending: Cardiology | Admitting: Cardiology

## 2022-06-14 ENCOUNTER — Other Ambulatory Visit: Payer: Self-pay

## 2022-06-14 ENCOUNTER — Other Ambulatory Visit (HOSPITAL_COMMUNITY): Payer: Self-pay

## 2022-06-14 DIAGNOSIS — Z79899 Other long term (current) drug therapy: Secondary | ICD-10-CM

## 2022-06-14 DIAGNOSIS — I484 Atypical atrial flutter: Secondary | ICD-10-CM | POA: Diagnosis not present

## 2022-06-14 DIAGNOSIS — I4819 Other persistent atrial fibrillation: Secondary | ICD-10-CM | POA: Diagnosis present

## 2022-06-14 DIAGNOSIS — Z7984 Long term (current) use of oral hypoglycemic drugs: Secondary | ICD-10-CM | POA: Diagnosis not present

## 2022-06-14 DIAGNOSIS — I959 Hypotension, unspecified: Secondary | ICD-10-CM | POA: Diagnosis not present

## 2022-06-14 DIAGNOSIS — N1831 Chronic kidney disease, stage 3a: Secondary | ICD-10-CM | POA: Diagnosis present

## 2022-06-14 DIAGNOSIS — I471 Supraventricular tachycardia, unspecified: Secondary | ICD-10-CM | POA: Diagnosis not present

## 2022-06-14 DIAGNOSIS — I4892 Unspecified atrial flutter: Secondary | ICD-10-CM | POA: Diagnosis present

## 2022-06-14 DIAGNOSIS — I428 Other cardiomyopathies: Secondary | ICD-10-CM | POA: Diagnosis present

## 2022-06-14 DIAGNOSIS — Z888 Allergy status to other drugs, medicaments and biological substances status: Secondary | ICD-10-CM

## 2022-06-14 DIAGNOSIS — Z7901 Long term (current) use of anticoagulants: Secondary | ICD-10-CM | POA: Diagnosis not present

## 2022-06-14 DIAGNOSIS — I5023 Acute on chronic systolic (congestive) heart failure: Secondary | ICD-10-CM

## 2022-06-14 DIAGNOSIS — I130 Hypertensive heart and chronic kidney disease with heart failure and stage 1 through stage 4 chronic kidney disease, or unspecified chronic kidney disease: Secondary | ICD-10-CM | POA: Diagnosis present

## 2022-06-14 DIAGNOSIS — N179 Acute kidney failure, unspecified: Secondary | ICD-10-CM | POA: Diagnosis present

## 2022-06-14 DIAGNOSIS — I5043 Acute on chronic combined systolic (congestive) and diastolic (congestive) heart failure: Secondary | ICD-10-CM | POA: Diagnosis not present

## 2022-06-14 DIAGNOSIS — I340 Nonrheumatic mitral (valve) insufficiency: Secondary | ICD-10-CM | POA: Diagnosis present

## 2022-06-14 LAB — MRSA NEXT GEN BY PCR, NASAL: MRSA by PCR Next Gen: NOT DETECTED

## 2022-06-14 LAB — COMPREHENSIVE METABOLIC PANEL
ALT: 21 U/L (ref 0–44)
AST: 25 U/L (ref 15–41)
Albumin: 3.5 g/dL (ref 3.5–5.0)
Alkaline Phosphatase: 69 U/L (ref 38–126)
Anion gap: 11 (ref 5–15)
BUN: 17 mg/dL (ref 8–23)
CO2: 31 mmol/L (ref 22–32)
Calcium: 9.5 mg/dL (ref 8.9–10.3)
Chloride: 97 mmol/L — ABNORMAL LOW (ref 98–111)
Creatinine, Ser: 1.94 mg/dL — ABNORMAL HIGH (ref 0.61–1.24)
GFR, Estimated: 36 mL/min — ABNORMAL LOW (ref >60)
Glucose, Bld: 102 mg/dL — ABNORMAL HIGH (ref 70–99)
Potassium: 3.7 mmol/L (ref 3.5–5.1)
Sodium: 139 mmol/L (ref 135–145)
Total Bilirubin: 0.9 mg/dL (ref 0.3–1.2)
Total Protein: 7.5 g/dL (ref 6.5–8.1)

## 2022-06-14 LAB — TSH: TSH: 0.183 u[IU]/mL — ABNORMAL LOW (ref 0.350–4.500)

## 2022-06-14 LAB — CBC
HCT: 42.2 % (ref 39.0–52.0)
Hemoglobin: 14.8 g/dL (ref 13.0–17.0)
MCH: 33.6 pg (ref 26.0–34.0)
MCHC: 35.1 g/dL (ref 30.0–36.0)
MCV: 95.7 fL (ref 80.0–100.0)
Platelets: 163 10*3/uL (ref 150–400)
RBC: 4.41 MIL/uL (ref 4.22–5.81)
RDW: 13.9 % (ref 11.5–15.5)
WBC: 4.5 10*3/uL (ref 4.0–10.5)
nRBC: 0 % (ref 0.0–0.2)

## 2022-06-14 LAB — MAGNESIUM: Magnesium: 2 mg/dL (ref 1.7–2.4)

## 2022-06-14 LAB — BRAIN NATRIURETIC PEPTIDE: B Natriuretic Peptide: 104.2 pg/mL — ABNORMAL HIGH (ref 0.0–100.0)

## 2022-06-14 MED ORDER — ACETAMINOPHEN 325 MG PO TABS: 650.0000 mg | ORAL_TABLET | ORAL | Status: DC | PRN

## 2022-06-14 MED ORDER — SODIUM CHLORIDE 0.9% FLUSH
3.0000 mL | Freq: Two times a day (BID) | INTRAVENOUS | Status: DC
  Administered 2022-06-14 – 2022-06-19 (×8): 3 mL via INTRAVENOUS

## 2022-06-14 MED ORDER — RIVAROXABAN 20 MG PO TABS
20.0000 mg | ORAL_TABLET | Freq: Every day | ORAL | Status: DC
  Administered 2022-06-14 – 2022-06-19 (×6): 20 mg via ORAL
  Filled 2022-06-14 (×6): qty 1

## 2022-06-14 MED ORDER — METOPROLOL SUCCINATE ER 25 MG PO TB24
25.0000 mg | ORAL_TABLET | Freq: Every day | ORAL | Status: DC
  Administered 2022-06-15 – 2022-06-20 (×6): 25 mg via ORAL
  Filled 2022-06-14 (×6): qty 1

## 2022-06-14 MED ORDER — SODIUM CHLORIDE 0.9 % IV SOLN: 250.0000 mL | INTRAVENOUS | Status: DC | PRN

## 2022-06-14 MED ORDER — SODIUM CHLORIDE 0.9% FLUSH: 3.0000 mL | INTRAVENOUS | Status: DC | PRN

## 2022-06-14 MED ORDER — DAPAGLIFLOZIN PROPANEDIOL 10 MG PO TABS
10.0000 mg | ORAL_TABLET | Freq: Every day | ORAL | Status: DC
  Administered 2022-06-15 – 2022-06-20 (×6): 10 mg via ORAL
  Filled 2022-06-14 (×6): qty 1

## 2022-06-14 MED ORDER — FUROSEMIDE 10 MG/ML IJ SOLN
80.0000 mg | Freq: Two times a day (BID) | INTRAMUSCULAR | Status: DC
  Administered 2022-06-14 – 2022-06-15 (×2): 80 mg via INTRAVENOUS
  Filled 2022-06-14 (×2): qty 8

## 2022-06-14 MED ORDER — SACUBITRIL-VALSARTAN 24-26 MG PO TABS
1.0000 | ORAL_TABLET | Freq: Two times a day (BID) | ORAL | Status: DC
  Filled 2022-06-14: qty 1

## 2022-06-14 MED ORDER — DIGOXIN 125 MCG PO TABS
0.1250 mg | ORAL_TABLET | Freq: Every day | ORAL | Status: DC
  Administered 2022-06-15 – 2022-06-20 (×6): 0.125 mg via ORAL
  Filled 2022-06-14 (×6): qty 1

## 2022-06-14 NOTE — Progress Notes (Signed)
Orthopedic Tech Progress Note Patient Details:  [name] 1950/07/12 [id number]  Ortho Devices Type of Ortho Device: Ace wrap, Unna boot Ortho Device/Splint Location: BLE Ortho Device/Splint Interventions: Ordered, Application, Adjustment   Post Interventions Patient Tolerated: Well Instructions Provided: Care of [place] 06/14/2022, 5:37 PM

## 2022-06-14 NOTE — Progress Notes (Signed)
Heart Failure Navigator Progress Note  Assessed for Heart & Vascular TOC clinic readiness.  Patient does not meet criteria due to Advanced Heart Failure Team patient.   Navigator will sign off at this time.    Tamarra Geiselman, BSN, RN Heart Failure Nurse Navigator Secure Chat Only   

## 2022-06-14 NOTE — Progress Notes (Signed)
  EP is aware of pt admission and possible Tikosyn load.   Will follow at a distance for diuresis with close monitoring of renal function and electrolytes.   I spoke with the patient that we will come back and discuss tikosyn loading once he is diuresed.   Pt appears to be in fib now, flutter yesterday. EKG pending.   Please call with questions.    Legrand Como [address] [place], PA-C  06/14/2022 3:15 PM

## 2022-06-14 NOTE — Progress Notes (Signed)
[name] with heart failure team about patients low blood pressure and iv lasix administration. Got orders to give iv lasix since patient is asymptomatic and also to hold Entresto tonight if low bp. Will pass on to night shift RN.

## 2022-06-14 NOTE — TOC Benefit Eligibility Note (Signed)
Patient Teacher, English as a foreign language completed.    The patient is currently admitted and upon discharge could be taking dofetilide (Tikosyn) 500 mcg capsule.  The current 30 day co-pay is $0.00.   The patient is insured through [employer]   This test claim was processed through [place] amounts may vary at other pharmacies due to pharmacy/plan contracts, or as the patient moves through the different stages of their insurance plan.  Lyndel Safe, [place] Patient Advocate Specialist [place] Patient Advocate Team Direct Number: [phone]  Fax: [phone]

## 2022-06-15 ENCOUNTER — Other Ambulatory Visit (HOSPITAL_COMMUNITY): Payer: Self-pay

## 2022-06-15 DIAGNOSIS — I5023 Acute on chronic systolic (congestive) heart failure: Secondary | ICD-10-CM | POA: Diagnosis not present

## 2022-06-15 LAB — BASIC METABOLIC PANEL
Anion gap: 8 (ref 5–15)
BUN: 22 mg/dL (ref 8–23)
CO2: 32 mmol/L (ref 22–32)
Calcium: 9.2 mg/dL (ref 8.9–10.3)
Chloride: 96 mmol/L — ABNORMAL LOW (ref 98–111)
Creatinine, Ser: 1.84 mg/dL — ABNORMAL HIGH (ref 0.61–1.24)
GFR, Estimated: 39 mL/min — ABNORMAL LOW (ref >60)
Glucose, Bld: 95 mg/dL (ref 70–99)
Potassium: 3.8 mmol/L (ref 3.5–5.1)
Sodium: 136 mmol/L (ref 135–145)

## 2022-06-15 LAB — HEMOGLOBIN A1C
Hgb A1c MFr Bld: 5.7 % — ABNORMAL HIGH (ref 4.8–5.6)
Mean Plasma Glucose: 117 mg/dL

## 2022-06-15 LAB — DIGOXIN LEVEL: Digoxin Level: 0.3 ng/mL — ABNORMAL LOW (ref 0.8–2.0)

## 2022-06-15 LAB — CBC
HCT: 43.2 % (ref 39.0–52.0)
Hemoglobin: 15 g/dL (ref 13.0–17.0)
MCH: 33 pg (ref 26.0–34.0)
MCHC: 34.7 g/dL (ref 30.0–36.0)
MCV: 95.2 fL (ref 80.0–100.0)
Platelets: 148 10*3/uL — ABNORMAL LOW (ref 150–400)
RBC: 4.54 MIL/uL (ref 4.22–5.81)
RDW: 13.7 % (ref 11.5–15.5)
WBC: 4.7 10*3/uL (ref 4.0–10.5)
nRBC: 0 % (ref 0.0–0.2)

## 2022-06-15 LAB — MAGNESIUM: Magnesium: 2.2 mg/dL (ref 1.7–2.4)

## 2022-06-15 MED ORDER — POTASSIUM CHLORIDE CRYS ER 20 MEQ PO TBCR
60.0000 meq | EXTENDED_RELEASE_TABLET | Freq: Once | ORAL | Status: AC
  Administered 2022-06-15: 60 meq via ORAL
  Filled 2022-06-15: qty 3

## 2022-06-15 NOTE — Progress Notes (Signed)
admit plan to Tikosyn load once diuresed. Benefit for Tikosyn is $0 copay.  TOC following.

## 2022-06-15 NOTE — Progress Notes (Signed)
  Reviewed course thus far with Dr. Curt Bears.   Potassium3.8 (03/27 0022) Magnesium  2.2 (03/27 0022) Creatinine, ser  1.84* (03/27 0022)  Baseline Cr ~1.3.   Current CrCL <60 and would require dose reduction compared to his baseline.  Entresto also held for hypotension.  Received IV lasix this am, and currently still ordered for dose this evening.   We will follow along and plan to discuss initiating Tikosyn once he is back on oral medications and, ideally, once his Cr has improved towards his baseline.   I have personally discussed with the plan with the patient yesterday and today, who understands the Tikosyn load will add 3 days to his admission once he is optimized and considered stable to start.   Legrand Como [address] [place], PA-C  06/15/2022 10:47 AM

## 2022-06-15 NOTE — Progress Notes (Addendum)
Advanced Heart Failure Rounding Note  PCP-Cardiologist: Glori Bickers, MD   Subjective:   3/26 Diuresing with IV lasix. Negative 2.2 liters. EP aware of admit plan to Tikosyn load once diuresed. Soft BP over night . PM dose of entresto held.   Feels ok. Denies SOB.   Objective:   Weight Range: 113.2 kg Body mass index is 30.38 kg/m.   Vital Signs:   Temp:  [97.6 F (36.4 C)-98.3 F (36.8 C)] 98.3 F (36.8 C) (03/27 0351) Pulse Rate:  [73-83] 73 (03/27 0640) Resp:  [15-23] 23 (03/27 0640) BP: (74-117)/(57-77) 117/77 (03/27 0351) SpO2:  [89 %-97 %] 89 % (03/27 0640) Weight:  [113.2 kg-113.5 kg] 113.2 kg (03/27 0640) Last BM Date : 06/14/22  Weight change: Filed Weights   06/14/22 1400 06/15/22 0640  Weight: 113.5 kg 113.2 kg    Intake/Output:   Intake/Output Summary (Last 24 hours) at 06/15/2022 0713 Last data filed at 06/15/2022 0641 Gross per 24 hour  Intake --  Output 2250 ml  Net -2250 ml      Physical Exam    General:  Well appearing. No resp difficulty. Sitting on the side of the bed.  HEENT: Normal Neck: Supple. JVP 8-9  Carotids 2+ bilat; no bruits. No lymphadenopathy or thyromegaly appreciated. Cor: PMI nondisplaced. Irregular rate & rhythm. No rubs, gallops or murmurs. Lungs: Clear Abdomen: Soft, nontender, nondistended. No hepatosplenomegaly. No bruits or masses. Good bowel sounds. Extremities: No cyanosis, clubbing, rash, R and LLE unna boots. edema Neuro: Alert & orientedx3, cranial nerves grossly intact. moves all 4 extremities w/o difficulty. Affect pleasant   Telemetry   A fib 80s   EKG    N/A  Labs    CBC Recent Labs    06/14/22 1516 06/15/22 0022  WBC 4.5 4.7  HGB 14.8 15.0  HCT 42.2 43.2  MCV 95.7 95.2  PLT 163 [id number]*   Basic Metabolic Panel Recent Labs    06/14/22 1516 06/15/22 0022  NA 139 136  K 3.7 3.8  CL 97* 96*  CO2 31 32  GLUCOSE 102* 95  BUN 17 22  CREATININE 1.94* 1.84*  CALCIUM 9.5 9.2  MG 2.0  2.2   Liver Function Tests Recent Labs    06/14/22 1516  AST 25  ALT 21  ALKPHOS 69  BILITOT 0.9  PROT 7.5  ALBUMIN 3.5   No results for input(s): "LIPASE", "AMYLASE" in the last 72 hours. Cardiac Enzymes No results for input(s): "CKTOTAL", "CKMB", "CKMBINDEX", "TROPONINI" in the last 72 hours.  BNP: BNP (last 3 results) Recent Labs    04/08/22 1532 05/03/22 1429 06/14/22 1516  BNP 66.0 94.4 104.2*    ProBNP (last 3 results) No results for input(s): "PROBNP" in the last 8760 hours.   D-Dimer No results for input(s): "DDIMER" in the last 72 hours. Hemoglobin A1C Recent Labs    06/14/22 1516  HGBA1C 5.7*   Fasting Lipid Panel No results for input(s): "CHOL", "HDL", "LDLCALC", "TRIG", "CHOLHDL", "LDLDIRECT" in the last 72 hours. Thyroid Function Tests Recent Labs    06/14/22 1516  TSH 0.183*    Other results:   Imaging    No results found.   Medications:     Scheduled Medications:  dapagliflozin propanediol  10 mg Oral Daily   digoxin  0.125 mg Oral Daily   furosemide  80 mg Intravenous BID   metoprolol succinate  25 mg Oral Daily   rivaroxaban  20 mg Oral Q supper   sacubitril-valsartan  1 tablet Oral BID   sodium chloride flush  3 mL Intravenous Q12H    Infusions:  sodium chloride      PRN Medications: sodium chloride, acetaminophen, sodium chloride flush    Patient Profile   [name] is a 72 y.o. male with persistent afib (s/p ablation 2012, DCCV 09/12/17), HTN, and chronic systolic HF due to NICM.  Admitted with A/C  HFrEF and atrial flutter RVR.    Assessment/Plan   Acute on chronic systolic heart failure, NICM - Echo (1/24): EF 25-30%, moderate asymmetric LVH, RV moderately reduced, severe MR, mild to moderate TR - LHC (2012): normal coronaries. No ICD.  - Volume status improving. Give another dose of IV lasix  then stop.  Eventual switch back to torsemide  - Continue Toprol XL and digoxin.  -Hypotensive overnight .  Hold entresto/spiro - Continue Farxiga 10 mg daily -  strict I&O, daily weights - Follow BMET daily.    Atrial flutter with RVR - Had Afib ablation 2012 and DC-CV 2019, 2020 and 2024 - - intolerant of amio (itching) - Saw Dr. Curt Bears 2/24. Ablation offered but he decided to defer  - EP aware of admit. Once diuresed plan to load Tikosyn. Updated EP.  -Rate controlled  - Continue digoxin and Toprol XL.  Continue xarelto daily.  - Possible ablation down the road.  - Will also need to complete home sleep study. He has the kit at home and plan to complete after discharge.    Severe MR -  MR is mild to moderate on echo 05/20/22    HTN - Low BP overnight.  - Hold entresto.   5. CKD Stage IIIa -Baseline creatinine ~ 1.5 -Creatinine on admit up to 1.9  -Avoid hypotension.  -As above hold entresto/spiro.   Length of Stay: 1  Ani Deoliveira, NP  06/15/2022, 7:13 AM  Advanced Heart Failure Team Pager [phone] (M-F; 7a - 5p)  Please contact [facility] for night-coverage after hours (5p -7a ) and weekends on [email or web address]

## 2022-06-16 ENCOUNTER — Encounter (HOSPITAL_COMMUNITY)
Admission: AD | Disposition: A | Discharge status: Home / Self Care | Payer: Self-pay | Source: Other Acute Inpatient Hospital | Attending: Cardiology

## 2022-06-16 DIAGNOSIS — I5023 Acute on chronic systolic (congestive) heart failure: Secondary | ICD-10-CM | POA: Diagnosis not present

## 2022-06-16 DIAGNOSIS — I4819 Other persistent atrial fibrillation: Secondary | ICD-10-CM | POA: Diagnosis not present

## 2022-06-16 DIAGNOSIS — I484 Atypical atrial flutter: Secondary | ICD-10-CM

## 2022-06-16 LAB — MAGNESIUM
Magnesium: 2.1 mg/dL (ref 1.7–2.4)
Magnesium: 2.3 mg/dL (ref 1.7–2.4)

## 2022-06-16 LAB — BASIC METABOLIC PANEL
Anion gap: 9 (ref 5–15)
Anion gap: 10 (ref 5–15)
BUN: 23 mg/dL (ref 8–23)
BUN: 20 mg/dL (ref 8–23)
CO2: 30 mmol/L (ref 22–32)
CO2: 28 mmol/L (ref 22–32)
Calcium: 9 mg/dL (ref 8.9–10.3)
Calcium: 9.2 mg/dL (ref 8.9–10.3)
Chloride: 95 mmol/L — ABNORMAL LOW (ref 98–111)
Chloride: 97 mmol/L — ABNORMAL LOW (ref 98–111)
Creatinine, Ser: 1.61 mg/dL — ABNORMAL HIGH (ref 0.61–1.24)
Creatinine, Ser: 1.53 mg/dL — ABNORMAL HIGH (ref 0.61–1.24)
GFR, Estimated: 45 mL/min — ABNORMAL LOW (ref >60)
GFR, Estimated: 48 mL/min — ABNORMAL LOW (ref >60)
Glucose, Bld: 133 mg/dL — ABNORMAL HIGH (ref 70–99)
Glucose, Bld: 117 mg/dL — ABNORMAL HIGH (ref 70–99)
Potassium: 4.3 mmol/L (ref 3.5–5.1)
Potassium: 4 mmol/L (ref 3.5–5.1)
Sodium: 134 mmol/L — ABNORMAL LOW (ref 135–145)
Sodium: 135 mmol/L (ref 135–145)

## 2022-06-16 LAB — CBC
HCT: 42.3 % (ref 39.0–52.0)
Hemoglobin: 14.5 g/dL (ref 13.0–17.0)
MCH: 32.3 pg (ref 26.0–34.0)
MCHC: 34.3 g/dL (ref 30.0–36.0)
MCV: 94.2 fL (ref 80.0–100.0)
Platelets: 144 10*3/uL — ABNORMAL LOW (ref 150–400)
RBC: 4.49 MIL/uL (ref 4.22–5.81)
RDW: 13.7 % (ref 11.5–15.5)
WBC: 4.5 10*3/uL (ref 4.0–10.5)
nRBC: 0 % (ref 0.0–0.2)

## 2022-06-16 SURGERY — CARDIOVERSION
Anesthesia: General

## 2022-06-16 MED ORDER — SODIUM CHLORIDE 0.9% FLUSH
3.0000 mL | Freq: Two times a day (BID) | INTRAVENOUS | Status: DC
  Administered 2022-06-16 – 2022-06-19 (×7): 3 mL via INTRAVENOUS

## 2022-06-16 MED ORDER — TORSEMIDE 20 MG PO TABS
20.0000 mg | ORAL_TABLET | Freq: Every day | ORAL | Status: DC
  Administered 2022-06-16 – 2022-06-20 (×5): 20 mg via ORAL
  Filled 2022-06-16 (×5): qty 1

## 2022-06-16 MED ORDER — POTASSIUM CHLORIDE CRYS ER 20 MEQ PO TBCR
20.0000 meq | EXTENDED_RELEASE_TABLET | Freq: Once | ORAL | Status: AC
  Administered 2022-06-16: 20 meq via ORAL
  Filled 2022-06-16: qty 1

## 2022-06-16 MED ORDER — DOFETILIDE 500 MCG PO CAPS
500.0000 ug | ORAL_CAPSULE | Freq: Two times a day (BID) | ORAL | Status: DC
  Administered 2022-06-16 – 2022-06-17 (×3): 500 ug via ORAL
  Filled 2022-06-16 (×4): qty 1

## 2022-06-16 MED ORDER — SODIUM CHLORIDE 0.9% FLUSH: 3.0000 mL | INTRAVENOUS | Status: DC | PRN

## 2022-06-16 MED ORDER — SPIRONOLACTONE 12.5 MG HALF TABLET
12.5000 mg | ORAL_TABLET | Freq: Every day | ORAL | Status: DC
  Administered 2022-06-16 – 2022-06-20 (×5): 12.5 mg via ORAL
  Filled 2022-06-16 (×5): qty 1

## 2022-06-16 MED ORDER — SODIUM CHLORIDE 0.9 % IV SOLN: 250.0000 mL | INTRAVENOUS | Status: DC | PRN

## 2022-06-16 NOTE — TOC Initial Note (Signed)
Transition of Care [facility]) - Initial/Assessment Note    Patient Details  Name: [name] MRN: [id number] Date of Birth: February 13, 1951  Transition of Care [facility]) CM/SW Contact:    Erenest Rasher, RN Phone Number: [phone] 06/16/2022, 11:12 AM  Clinical Narrative:                  CM spoke to pt and states he still works full-time. Reports his FMLA paperwork has been completed. Pt states reports he has scale at home to do daily weights.   Hospital Follow up appt scheduled PCP-Dr Criss Rosales June 28, 2022 at 1:30 pm   Expected Discharge Plan: Home/Self Care Barriers to Discharge: Continued Medical Work up   Patient Goals and CMS Choice Patient states their goals for this hospitalization and ongoing recovery are:: wants to be able to contiue to work and remain independent          Expected Discharge Plan and Services   Discharge Planning Services: CM Consult   Living arrangements for the past 2 months: Single Family Home                   Prior Living Arrangements/Services Living arrangements for the past 2 months: Single Family Home Lives with:: Adult Children Patient language and need for interpreter reviewed:: Yes        Need for Family Participation in Patient Care: No (Comment) Care giver support system in place?: Yes (comment) Current home services: DME (scale) Criminal Activity/Legal Involvement Pertinent to Current Situation/Hospitalization: No - Comment as needed  Activities of Daily Living Home Assistive Devices/Equipment: Eyeglasses ADL Screening (condition at time of admission) Patient's cognitive ability adequate to safely complete daily activities?: Yes Is the patient deaf or have difficulty hearing?: No Does the patient have difficulty seeing, even when wearing glasses/contacts?: No Does the patient have difficulty concentrating, remembering, or making decisions?: No Patient able to express need for assistance with ADLs?: Yes Does the patient have  difficulty dressing or bathing?: No Independently performs ADLs?: Yes (appropriate for developmental age) Does the patient have difficulty walking or climbing stairs?: No Weakness of Legs: None Weakness of Arms/Hands: None  Permission Sought/Granted Permission sought to share information with : Case Manager, Family Supports, PCP Permission granted to share information with : Yes, Verbal Permission Granted  Share Information with NAME: Mordekai Wille     Permission granted to share info w Relationship: daughter  Permission granted to share info w Contact Information: [phone]  Emotional Assessment Appearance:: Appears stated age Attitude/Demeanor/Rapport: Engaged Affect (typically observed): Accepting Orientation: : Oriented to Self, Oriented to Place, Oriented to  Time, Oriented to Situation   Psych Involvement: No (comment)  Admission diagnosis:  Atrial flutter (HCC) [I48.92] Patient Active Problem List   Diagnosis Date Noted   Atrial flutter ([place]) 06/14/2022   Acute on chronic combined systolic and diastolic CHF (congestive heart failure) ([place]) 03/24/2022   Acute on chronic systolic heart failure ([place]) 09/14/2017   Atrial flutter with rapid ventricular response ([place])    Atrial fibrillation ([place]) 07/07/2012   Long term (current) use of anticoagulants 07/07/2012   PCP:  Lucianne Lei, MD Pharmacy:   [facility] [place], [place] [address] [place] [address] Phone: [phone] Fax: [place], [place] [address] Phone: [phone] Fax: [phone]  [facility] [address] [place] [address] Phone: [phone] Fax: [phone]     Social Determinants of Health (SDOH) Social History: SDOH Screenings   Food Insecurity: No Food Insecurity (06/14/2022)   Housing: Low Risk  (06/14/2022)  Transportation Needs: No Transportation Needs (06/14/2022)  Utilities: Not At Risk (06/14/2022)  Financial Resource Strain: Low Risk  (06/16/2022)  Tobacco Use: Low Risk  (06/13/2022)   SDOH Interventions: Financial Strain Interventions: Intervention Not Indicated   Readmission Risk Interventions     No data to display

## 2022-06-16 NOTE — Consult Note (Addendum)
ELECTROPHYSIOLOGY CONSULT NOTE    Patient ID: [name] MRN: [id number], DOB/AGE: 72/03/1950 72 y.o.  Admit date: 06/14/2022 Date of Consult: 06/16/2022  Primary Physician: Duane Lei, MD Primary Cardiologist: Duane Bickers, MD  Electrophysiologist: Dr. Curt Mcmillan   Referring Provider: Dr. Haroldine Mcmillan   Patient Profile: [name] is a 72 y.o. male with persistent afib (s/p ablation 2012, DCCV 09/12/17), HTN, and chronic systolic HF due to NICM.  Admitted with A/C  HFrEF and atrial flutter RVR.    Seen today at the request of Dr. Haroldine Mcmillan and Dr. Daniel Mcmillan to discuss tikosyn initiation now s/p diuresis.   HPI:  [name] is a 72 y.o. male with medical history as above.   Complicated AF history included MAZE and convergent ablation.  Previously saw Dr. Curt Mcmillan and discussed ablation vs tikosyn but pt wished to defer.  With convergent ablation, traditional ablation less likely to be effective.    Pt seen in HF clinic earlier this week and noted to be markedly volume overloaded in the setting of ?AFL with RVR. Pt admitted for diuresis with ~20 lb weight gain from baseline noted.   Pt negative 4L but weight relatively unchanged, ? Accuracy.  Initially with AKI now back towards baseline and appropriate for standard dose of tikosyn.   This am pt is feeling well. I had spoken to him the prior days about the possibility of Tikosyn and he is in agreement. Legs and breathing feel better. He actually enjoys the comfort of the UNNA boots in place.   Labs Potassium4.3 (03/28 0011) Magnesium  2.1 (03/28 0011) Creatinine, ser  1.61* (03/28 0011) PLT  144* (03/28 0011) HGB  14.5 (03/28 0011) WBC 4.5 (03/28 0011)  .    Past Medical History:  Diagnosis Date   Atrial tachycardia    Chronic HFrEF (heart failure with reduced ejection fraction) (HCC)    Hypertension    Mitral regurgitation    NICM (nonischemic cardiomyopathy) ([place])    a. 2009 EF 25%; b. 05/2010 Cath: nl cors.  EF 45%; c. 12/2017 Echo: EF 45-50%, diff HK, mild MR, sev dil LA.   Paroxysmal atrial flutter ([place])    a. Dx in 05/2018.   Persistent atrial fibrillation ([place])    a. 2012 s/p RFCA; b. 08/2017 s/p DCCV.     Surgical History:  Past Surgical History:  Procedure Laterality Date   CARDIAC ELECTROPHYSIOLOGY STUDY AND ABLATION  2012   Dr. Viann Mcmillan   CARDIOVERSION N/A 09/12/2017   Procedure: CARDIOVERSION;  Surgeon: Duane Pain, MD;  Location: [facility] ENDOSCOPY;  Service: Cardiovascular;  Laterality: N/A;   CARDIOVERSION N/A 03/25/2022   Procedure: CARDIOVERSION;  Surgeon: Duane Dresser, MD;  Location: [facility] ENDOSCOPY;  Service: Cardiovascular;  Laterality: N/A;   LOOP RECORDER IMPLANT  06/11/2010   TEE WITHOUT CARDIOVERSION N/A 09/12/2017   Procedure: TRANSESOPHAGEAL ECHOCARDIOGRAM (TEE);  Surgeon: Duane Pain, MD;  Location: [facility] ENDOSCOPY;  Service: Cardiovascular;  Laterality: N/A;   TEE WITHOUT CARDIOVERSION N/A 03/25/2022   Procedure: TRANSESOPHAGEAL ECHOCARDIOGRAM (TEE);  Surgeon: Duane Dresser, MD;  Location: [facility] ENDOSCOPY;  Service: Cardiovascular;  Laterality: N/A;     Medications Prior to Admission  Medication Sig Dispense Refill Last Dose   dapagliflozin propanediol (FARXIGA) 10 MG TABS tablet Take 1 tablet (10 mg total) by mouth daily. 30 tablet 5 06/14/2022   digoxin (LANOXIN) 0.125 MG tablet Take 1 tablet (0.125 mg total) by mouth daily. 30 tablet 5 06/14/2022   metoprolol succinate (TOPROL-XL) 25  MG 24 hr tablet Take 1 tablet (25 mg total) by mouth daily. 30 tablet 5 06/14/2022 at 0600   OVER THE COUNTER MEDICATION Take 5 mLs by mouth in the morning. Black seed oil.   06/14/2022   rivaroxaban (XARELTO) 20 MG TABS tablet Take 1 tablet (20 mg total) by mouth daily with supper. 30 tablet 5 06/13/2022 at 1700   sacubitril-valsartan (ENTRESTO) 24-26 MG Take 1 tablet by mouth 2 (two) times daily. 60 tablet 3 06/14/2022   sildenafil (VIAGRA) 25 MG tablet Take 1 tablet (25 mg total) by mouth  daily as needed for erectile dysfunction. 10 tablet 0 unknown   torsemide (DEMADEX) 20 MG tablet Take 0.5 tablets (10 mg total) by mouth daily. 30 tablet 5 06/14/2022    Inpatient Medications:   dapagliflozin propanediol  10 mg Oral Daily   digoxin  0.125 mg Oral Daily   metoprolol succinate  25 mg Oral Daily   rivaroxaban  20 mg Oral Q supper   sodium chloride flush  3 mL Intravenous Q12H    Allergies:  Allergies  Allergen Reactions   Pacerone [Amiodarone] Itching and Rash    No family history on file.   Physical Exam: Vitals:   06/15/22 1556 06/15/22 2005 06/15/22 2200 06/16/22 0229  BP: 91/76 97/67 (!) 91/54 98/68  Pulse: 87 77 77 80  Resp: 20 19 14 17   Temp: 97.9 F (36.6 C) 97.6 F (36.4 C) 98.3 F (36.8 C) 97.7 F (36.5 C)  TempSrc: Oral Oral Oral Oral  SpO2: 95% 94% 97% 92%  Weight:    113.2 kg  Height:        GEN- NAD, A&O x 3, normal affect HEENT: Normocephalic, atraumatic Lungs- CTAB, Normal effort.  Heart- Irregularly irregular rate and rhythm, No M/G/R.  GI- Soft, NT, ND.  Extremities- No clubbing, cyanosis, or edema. Unna boots in place   Radiology/Studies: ECHOCARDIOGRAM LIMITED  Result Date: 05/20/2022    ECHOCARDIOGRAM REPORT   Patient Name:   [name] [facility] Date of Exam: 05/20/2022 Medical Rec #:  [id number]       Height:       76.0 in Accession #:    [id number]      Weight:       253.6 lb Date of Birth:  Dec 14, 1950      BSA:          2.450 m Patient Age:    72 years        BP:           132/72 mmHg Patient Gender: M               HR:           70 bpm. Exam Location:  Outpatient Procedure: Color Doppler, Cardiac Doppler and Limited Echo Indications:    congestive heart failure  History:        Patient has prior history of Echocardiogram examinations, most                 recent 03/26/2022. Arrythmias:PVC.  Sonographer:    Johny Chess RDCS Referring Phys: Edmund  1. Limited images  2. Left ventricular ejection fraction, by  estimation, is 45 to 50%. Left ventricular ejection fraction by 2D MOD biplane is 47.4 %. Left ventricular ejection fraction by PLAX is 47 %. The left ventricle has mildly decreased function. The left ventricle demonstrates global hypokinesis. The left ventricular internal cavity size was moderately dilated. There is moderate  left ventricular hypertrophy. Left ventricular diastolic parameters are indeterminate.  3. Right ventricular systolic function was not well visualized. The right ventricular size is not well visualized.  4. The mitral valve is abnormal. Mild to moderate mitral valve regurgitation.  5. The aortic valve is tricuspid. Aortic valve regurgitation is not visualized.  6. The inferior vena cava is normal in size with greater than 50% respiratory variability, suggesting right atrial pressure of 3 mmHg. Comparison(s): Changes from prior study are noted. 03/26/2022: LVEF 25-30%, severe MR. FINDINGS  Left Ventricle: Left ventricular ejection fraction, by estimation, is 45 to 50%. Left ventricular ejection fraction by PLAX is 47 %. Left ventricular ejection fraction by 2D MOD biplane is 47.4 %. The left ventricle has mildly decreased function. The left ventricle demonstrates global hypokinesis. The left ventricular internal cavity size was moderately dilated. There is moderate left ventricular hypertrophy. Left ventricular diastolic parameters are indeterminate. Right Ventricle: The right ventricular size is not well visualized. Right vetricular wall thickness was not well visualized. Right ventricular systolic function was not well visualized. Left Atrium: Left atrial size was not assessed. Right Atrium: Right atrial size was not assessed. Pericardium: There is no evidence of pericardial effusion. Mitral Valve: The mitral valve is abnormal. There is mild thickening of the anterior and posterior mitral valve leaflet(s). Mild mitral annular calcification. Mild to moderate mitral valve regurgitation. Tricuspid  Valve: The tricuspid valve is grossly normal. Tricuspid valve regurgitation is trivial. Aortic Valve: The aortic valve is tricuspid. Aortic valve regurgitation is not visualized. Aortic valve peak gradient measures 8.9 mmHg. Pulmonic Valve: The pulmonic valve was grossly normal. Pulmonic valve regurgitation is trivial. Aorta: The aortic root and ascending aorta are structurally normal, with no evidence of dilitation. Venous: The inferior vena cava is normal in size with greater than 50% respiratory variability, suggesting right atrial pressure of 3 mmHg. IAS/Shunts: The interatrial septum was not assessed.  LEFT VENTRICLE PLAX 2D                        Biplane EF (MOD) LV EF:         Left            LV Biplane EF:   Left                ventricular                      ventricular                ejection                         ejection                fraction by                      fraction by                PLAX is 47                       2D MOD                %.                               biplane is LVIDd:  6.20 cm                          47.4 %. LVIDs:         4.70 cm LV PW:         1.60 cm         Diastology LV IVS:        1.00 cm         LV e' medial:    6.05 cm/s LVOT diam:     2.50 cm         LV E/e' medial:  16.1 LV SV:         101             LV e' lateral:   12.30 cm/s LV SV Index:   41              LV E/e' lateral: 7.9 LVOT Area:     4.91 cm  LV Volumes (MOD) LV vol d, MOD    191.5 ml A2C: LV vol d, MOD    186.7 ml A4C: LV vol s, MOD    106.7 ml A2C: LV vol s, MOD    93.3 ml A4C: LV SV MOD A2C:   84.9 ml LV SV MOD A4C:   186.7 ml LV SV MOD BP:    88.8 ml RIGHT VENTRICLE             IVC RV S prime:     10.90 cm/s  IVC diam: 1.60 cm LEFT ATRIUM         Index LA diam:    5.70 cm 2.33 cm/m  AORTIC VALVE AV Area (Vmax): 3.36 cm AV Vmax:        149.00 cm/s AV Peak Grad:   8.9 mmHg LVOT Vmax:      102.00 cm/s LVOT Vmean:     68.400 cm/s LVOT VTI:       0.206 m MITRAL VALVE MV Area (PHT): 2.73 cm        SHUNTS MV Decel Time: 278 msec       Systemic VTI:  0.21 m MR Peak grad:    85.7 mmHg    Systemic Diam: 2.50 cm MR Mean grad:    55.0 mmHg MR Vmax:         463.00 cm/s MR Vmean:        345.0 cm/s MR PISA:         1.01 cm MR PISA Eff ROA: 9 mm MR PISA Radius:  0.40 cm MV E velocity: 97.70 cm/s MV A velocity: 28.70 cm/s MV E/A ratio:  3.40 Lyman Bishop MD Electronically signed by Lyman Bishop MD Signature Date/Time: 05/20/2022/3:25:19 PM    Final     EKG: 3/26 shows AF with RVR at 115 bpm (personally reviewed)  TELEMETRY: shows most likely AF vs atypical AFL at time, rates 70-110s (personally reviewed)  Assessment/Plan: Atrial flutter with RVR Atrial fibrillation S/p convergent ablation 2012 Intolerant to amio with itching Poor candidate for re-do ablation with likely atypical flutter and recurrent AF.  Potassium4.3 (03/28 0011) Magnesium  2.1 (03/28 0011) Creatinine, ser  1.61* (03/28 0011) We discussed tikosyn loading and pt is agreeable.  Entresto on hold for hypotension, so Hansen Carino need to follow Cr and electrolytes closely if resumed.   Acute on chronic systolic CHF Volume status improved, though weights not accurate Going back on torsemide today.  Per HF team.  EF 03/2022 LVEF  25-30%, mod LVH, Mod RV reduction, severe MR, mild/mod TR  AKI on CKD Stage IIIa Having been following along as prior days CrCl would have resulted in dose reduction of Tikosyn.  Cr 1.61 today. CrCl 67 by actual weight. Would plan to start tikosyn at 500 mcg BID.   For questions or updates, please contact [facility] Please consult [email or web address] for contact info under Cardiology/STEMI.  Jacalyn Lefevre, PA-C  06/16/2022 6:57 AM  I have seen and examined this patient with Oda Kilts.  Agree with above, note added to reflect my findings.  He has a history of atrial fibrillation post ablation, chronic systolic heart failure.  He was admitted to the hospital with atrial flutter and  rapid rates with volume overload.  He has been getting diuresis per the heart failure service.  He currently feels well.  Creatinine has remained stable.  GEN: Well nourished, well developed, in no acute distress  HEENT: normal  Neck: no JVD, carotid bruits, or masses Cardiac: RRR; no murmurs, rubs, or gallops,no edema  Respiratory:  clear to auscultation bilaterally, normal work of breathing GI: soft, nontender, nondistended, + BS MS: no deformity or atrophy  Skin: warm and dry Neuro:  Strength and sensation are intact Psych: euthymic mood, full affect   Persistent atrial fibrillation/atrial flutter: Had convergent ablation in 2012.  Intolerant to amiodarone.  He would benefit from rhythm control.  As he has had prior ablation, we Urie Loughner plan for dofetilide load this admission.  QTc has remained stable.  Marissa Lowrey start tonight.  Hopefully he can have a cardioversion after his fourth dose over the weekend. Acute on chronic systolic heart failure: Potentially related to atrial flutter.  Ejection fraction 25 to 30%.  Diuresis per heart failure cardiology.  Amerah Puleo M. Max Nuno MD 06/16/2022 12:59 PM

## 2022-06-16 NOTE — Plan of Care (Signed)

## 2022-06-16 NOTE — Progress Notes (Addendum)
Advanced Heart Failure Rounding Note  PCP-Cardiologist: Glori Bickers, MD   Subjective:   3/26 Diuresing with IV lasix. Negative 2.2 liters. EP aware of admit plan to Tikosyn load once diuresed.  BP soft, Entresto on hold. Last dose of lasix yesterday morning, -2.2L UOP  Sitting on EOB. Feels fine, no complaints. Denies CP/SOB.   Objective:   Weight Range: 113.2 kg Body mass index is 30.38 kg/m.   Vital Signs:   Temp:  [97.6 F (36.4 C)-98.3 F (36.8 C)] 97.7 F (36.5 C) (03/28 0229) Pulse Rate:  [77-90] 80 (03/28 0229) Resp:  [14-20] 17 (03/28 0229) BP: (91-115)/(54-98) 98/68 (03/28 0229) SpO2:  [92 %-97 %] 92 % (03/28 0229) Weight:  [113.2 kg] 113.2 kg (03/28 0229) Last BM Date : 06/14/22  Weight change: Filed Weights   06/14/22 1400 06/15/22 0640 06/16/22 0229  Weight: 113.5 kg 113.2 kg 113.2 kg   Intake/Output:   Intake/Output Summary (Last 24 hours) at 06/16/2022 0707 Last data filed at 06/16/2022 0404 Gross per 24 hour  Intake 960 ml  Output 2250 ml  Net -1290 ml    Physical Exam    General:  well appearing.  No respiratory difficulty HEENT: normal Neck: supple. JVD ~8 cm. Carotids 2+ bilat; no bruits. No lymphadenopathy or thyromegaly appreciated. Cor: PMI nondisplaced. Irregular rate & rhythm. No rubs, gallops or murmurs. Lungs: clear Abdomen: soft, nontender, nondistended. No hepatosplenomegaly. No bruits or masses. Good bowel sounds. Extremities: no cyanosis, clubbing, rash, edema. + UNNA boots  Neuro: alert & oriented x 3, cranial nerves grossly intact. moves all 4 extremities w/o difficulty. Affect pleasant.   Telemetry   A fib low 100s  (Personally reviewed)    EKG    N/A  Labs    CBC Recent Labs    06/15/22 0022 06/16/22 0011  WBC 4.7 4.5  HGB 15.0 14.5  HCT 43.2 42.3  MCV 95.2 94.2  PLT 148* [id number]*   Basic Metabolic Panel Recent Labs    06/15/22 0022 06/16/22 0011  NA 136 134*  K 3.8 4.3  CL 96* 95*  CO2 32 30   GLUCOSE 95 133*  BUN 22 23  CREATININE 1.84* 1.61*  CALCIUM 9.2 9.0  MG 2.2 2.1   Liver Function Tests Recent Labs    06/14/22 1516  AST 25  ALT 21  ALKPHOS 69  BILITOT 0.9  PROT 7.5  ALBUMIN 3.5   No results for input(s): "LIPASE", "AMYLASE" in the last 72 hours. Cardiac Enzymes No results for input(s): "CKTOTAL", "CKMB", "CKMBINDEX", "TROPONINI" in the last 72 hours.  BNP: BNP (last 3 results) Recent Labs    04/08/22 1532 05/03/22 1429 06/14/22 1516  BNP 66.0 94.4 104.2*    ProBNP (last 3 results) No results for input(s): "PROBNP" in the last 8760 hours.   D-Dimer No results for input(s): "DDIMER" in the last 72 hours. Hemoglobin A1C Recent Labs    06/14/22 1516  HGBA1C 5.7*   Fasting Lipid Panel No results for input(s): "CHOL", "HDL", "LDLCALC", "TRIG", "CHOLHDL", "LDLDIRECT" in the last 72 hours. Thyroid Function Tests Recent Labs    06/14/22 1516  TSH 0.183*    Other results:  Imaging    No results found.   Medications:     Scheduled Medications:  dapagliflozin propanediol  10 mg Oral Daily   digoxin  0.125 mg Oral Daily   metoprolol succinate  25 mg Oral Daily   rivaroxaban  20 mg Oral Q supper   sodium chloride  flush  3 mL Intravenous Q12H    Infusions:  sodium chloride      PRN Medications: sodium chloride, acetaminophen, sodium chloride flush    Patient Profile   [name] is a 72 y.o. male with persistent afib (s/p ablation 2012, DCCV 09/12/17), HTN, and chronic systolic HF due to NICM.  Admitted with A/C  HFrEF and atrial flutter RVR.    Assessment/Plan  Acute on chronic systolic heart failure, NICM - Echo (1/24): EF 25-30%, moderate asymmetric LVH, RV moderately reduced, severe MR, mild to moderate TR - LHC (2012): normal coronaries. No ICD.  - Volume status improving although weight mostly unchanged?. Start 20 Torsemide daily - Continue Toprol XL and digoxin.  - BPs remain soft. Continue to hold  entresto/spiro - Continue Farxiga 10 mg daily - strict I&O, daily weights - Follow BMET daily.    Atrial flutter with RVR - Had Afib ablation 2012 and DC-CV 2019, 2020 and 2024 - intolerant of amio (itching) - Saw Dr. Curt Bears 2/24. Ablation offered but he decided to defer  - EP aware of admit. Now diuresed,once SCr improved plan to load Tikosyn. Updated EP.  - Rate controlled  - Continue digoxin and Toprol XL.  Continue xarelto daily.  - Possible ablation down the road.  - Will also need to complete home sleep study. He has the kit at home and plan to complete after discharge.    Severe MR -  MR is mild to moderate on echo 05/20/22    HTN - continues with soft BPs - Continue to hold entresto.   5. AKI on CKD Stage IIIa - Baseline creatinine ~ 1.5 - Creatinine on admit up to 1.9  - now down trending with diuresis 1.96>1.84>1.61 - Avoid hypotension.  - As above hold entresto/spiro.   Length of Stay: [place], NP  06/16/2022, 7:07 AM  Advanced Heart Failure Team Pager [phone] (M-F; 7a - 5p)  Please contact [facility] for night-coverage after hours (5p -7a ) and weekends on [email or web address]

## 2022-06-16 NOTE — Progress Notes (Signed)
Pharmacy: Dofetilide (Tikosyn) - Initial Consult Assessment and Electrolyte Replacement  Pharmacy consulted to assist in monitoring and replacing electrolytes in this 72 y.o. male admitted on 06/14/2022 undergoing dofetilide initiation. First dofetilide dose: 06/16/22  Assessment:  Patient Exclusion Criteria: If any screening criteria checked as "Yes", then  patient  should NOT receive dofetilide until criteria item is corrected.  If "Yes" please indicate correction plan.  YES  NO Patient  Exclusion Criteria Correction Plan   [x]   []   Baseline QTc interval is greater than or equal to 440 msec. IF above YES box checked dofetilide contraindicated unless patient has ICD; then may proceed if QTc 500-550 msec or with known ventricular conduction abnormalities may proceed with QTc 550-600 msec. QTc = 452 EP aware   []   [x]   Patient is known or suspected to have a digoxin level greater than 2 ng/ml: Lab Results  Component Value Date   DIGOXIN 0.3 (L) 06/15/2022       []   [x]   Creatinine clearance less than 20 ml/min (calculated using Cockcroft-Gault, actual body weight and serum creatinine): Estimated Creatinine Clearance: 58 mL/min (A) (by C-G formula based on SCr of 1.61 mg/dL (H)).     []   [x]  Patient has received drugs known to prolong the QT intervals within the last 48 hours (phenothiazines, tricyclics or tetracyclic antidepressants, erythromycin, H-1 antihistamines, cisapride, fluoroquinolones, azithromycin, ondansetron).   Updated information on QT prolonging agents is available to be searched on the following database:QT prolonging agents     []   [x]   Patient received a dose of hydrochlorothiazide (Oretic) alone or in any combination including triamterene (Dyazide, Maxzide) in the last 48 hours.    []   [x]  Patient received a medication known to increase dofetilide plasma concentrations prior to initial dofetilide dose:  Trimethoprim (Primsol, Proloprim) in the last 36  hours Verapamil (Calan, Verelan) in the last 36 hours or a sustained release dose in the last 72 hours Megestrol (Megace) in the last 5 days  Cimetidine (Tagamet) in the last 6 hours Ketoconazole (Nizoral) in the last 24 hours Itraconazole (Sporanox) in the last 48 hours  Prochlorperazine (Compazine) in the last 36 hours     []   [x]   Patient is known to have a history of torsades de pointes; congenital or acquired long QT syndromes.    []   [x]   Patient has received a Class 1 antiarrhythmic with less than 2 half-lives since last dose. (Disopyramide, Quinidine, Procainamide, Lidocaine, Mexiletine, Flecainide, Propafenone)    [x]   []   Patient has received amiodarone therapy in the past 3 months or amiodarone level is greater than 0.3 ng/ml. IV amio given 1/5-1/8, EP aware, ok to proceed   Labs:    Component Value Date/Time   K 4.3 06/16/2022 0011   MG 2.1 06/16/2022 0011     Plan: Select One Calculated CrCl  Dose q12h  [x]  > 60 ml/min 500 mcg  []  40-60 ml/min 250 mcg  []  20-40 ml/min 125 mcg   [x]   Physician selected initial dose within range recommended for patients level of renal function - will monitor for response.  []   Physician selected initial dose outside of range recommended for patients level of renal function - will discuss if the dose should be altered at this time.   Patient has been appropriately anticoagulated with Xarelto 20mg  daily.  Potassium: K >/= 4: Appropriate to initiate Tikosyn, no replacement needed    Magnesium: Mg >2: Appropriate to initiate Tikosyn, no replacement needed  Thank you for allowing pharmacy to participate in this patient's care   Einar Grad 06/16/2022  11:14 AM

## 2022-06-17 ENCOUNTER — Encounter (HOSPITAL_COMMUNITY): Payer: Self-pay | Admitting: Cardiology

## 2022-06-17 DIAGNOSIS — I484 Atypical atrial flutter: Secondary | ICD-10-CM | POA: Diagnosis not present

## 2022-06-17 DIAGNOSIS — I5023 Acute on chronic systolic (congestive) heart failure: Secondary | ICD-10-CM | POA: Diagnosis not present

## 2022-06-17 LAB — BASIC METABOLIC PANEL
Anion gap: 9 (ref 5–15)
BUN: 28 mg/dL — ABNORMAL HIGH (ref 8–23)
CO2: 28 mmol/L (ref 22–32)
Calcium: 9 mg/dL (ref 8.9–10.3)
Chloride: 94 mmol/L — ABNORMAL LOW (ref 98–111)
Creatinine, Ser: 1.7 mg/dL — ABNORMAL HIGH (ref 0.61–1.24)
GFR, Estimated: 43 mL/min — ABNORMAL LOW (ref >60)
Glucose, Bld: 97 mg/dL (ref 70–99)
Potassium: 4.5 mmol/L (ref 3.5–5.1)
Sodium: 131 mmol/L — ABNORMAL LOW (ref 135–145)

## 2022-06-17 LAB — POTASSIUM: Potassium: 4 mmol/L (ref 3.5–5.1)

## 2022-06-17 LAB — CBC
HCT: 43.3 % (ref 39.0–52.0)
Hemoglobin: 14.9 g/dL (ref 13.0–17.0)
MCH: 33 pg (ref 26.0–34.0)
MCHC: 34.4 g/dL (ref 30.0–36.0)
MCV: 95.8 fL (ref 80.0–100.0)
Platelets: 144 10*3/uL — ABNORMAL LOW (ref 150–400)
RBC: 4.52 MIL/uL (ref 4.22–5.81)
RDW: 13.4 % (ref 11.5–15.5)
WBC: 4.5 10*3/uL (ref 4.0–10.5)
nRBC: 0 % (ref 0.0–0.2)

## 2022-06-17 LAB — MAGNESIUM
Magnesium: 2.2 mg/dL (ref 1.7–2.4)
Magnesium: 2.3 mg/dL (ref 1.7–2.4)

## 2022-06-17 NOTE — Plan of Care (Signed)

## 2022-06-17 NOTE — Progress Notes (Addendum)
Advanced Heart Failure Rounding Note  PCP-Cardiologist: Glori Bickers, MD   Subjective:   3/26 Diuresing with IV lasix. Negative 2.2 liters. EP aware of admit plan to Tikosyn load once diuresed. 3/28 Tikosyn load started.  Converted to SR   Denies SOB.    Objective:   Weight Range: 114.3 kg Body mass index is 30.67 kg/m.   Vital Signs:   Temp:  [97.8 F (36.6 C)-98.5 F (36.9 C)] 97.8 F (36.6 C) (03/29 0328) Pulse Rate:  [71-91] 71 (03/29 0328) Resp:  [14-19] 16 (03/29 0328) BP: (93-111)/(64-79) 111/64 (03/29 0328) SpO2:  [91 %-95 %] 94 % (03/29 0328) Weight:  [114.3 kg] 114.3 kg (03/29 0328) Last BM Date : 06/16/22  Weight change: Filed Weights   06/15/22 0640 06/16/22 0229 06/17/22 0328  Weight: 113.2 kg 113.2 kg 114.3 kg   Intake/Output:   Intake/Output Summary (Last 24 hours) at 06/17/2022 0718 Last data filed at 06/16/2022 2300 Gross per 24 hour  Intake 840 ml  Output 1730 ml  Net -890 ml    Physical Exam    General:  Sitting on the side of the bed.  No resp difficulty HEENT: normal Neck: supple. no JVD. Carotids 2+ bilat; no bruits. No lymphadenopathy or thryomegaly appreciated. Cor: PMI nondisplaced. Regular rate & rhythm. No rubs, gallops or murmurs. Lungs: clear Abdomen: soft, nontender, nondistended. No hepatosplenomegaly. No bruits or masses. Good bowel sounds. Extremities: no cyanosis, clubbing, rash, R and LLE unna boots edema Neuro: alert & orientedx3, cranial nerves grossly intact. moves all 4 extremities w/o difficulty. Affect pleasant act. moves all 4 extremities w/o difficulty. Affect pleasant.   Telemetry   SR 70s . Converted last night.   EKG    Labs    CBC Recent Labs    06/16/22 0011 06/16/22 2341  WBC 4.5 4.5  HGB 14.5 14.9  HCT 42.3 43.3  MCV 94.2 95.8  PLT 144* [id number]*   Basic Metabolic Panel Recent Labs    06/16/22 1210 06/16/22 2341  NA 135 131*  K 4.0 4.5  CL 97* 94*  CO2 28 28  GLUCOSE 117* 97  BUN  20 28*  CREATININE 1.53* 1.70*  CALCIUM 9.2 9.0  MG 2.3 2.3   Liver Function Tests Recent Labs    06/14/22 1516  AST 25  ALT 21  ALKPHOS 69  BILITOT 0.9  PROT 7.5  ALBUMIN 3.5   No results for input(s): "LIPASE", "AMYLASE" in the last 72 hours. Cardiac Enzymes No results for input(s): "CKTOTAL", "CKMB", "CKMBINDEX", "TROPONINI" in the last 72 hours.  BNP: BNP (last 3 results) Recent Labs    04/08/22 1532 05/03/22 1429 06/14/22 1516  BNP 66.0 94.4 104.2*    ProBNP (last 3 results) No results for input(s): "PROBNP" in the last 8760 hours.   D-Dimer No results for input(s): "DDIMER" in the last 72 hours. Hemoglobin A1C Recent Labs    06/14/22 1516  HGBA1C 5.7*   Fasting Lipid Panel No results for input(s): "CHOL", "HDL", "LDLCALC", "TRIG", "CHOLHDL", "LDLDIRECT" in the last 72 hours. Thyroid Function Tests Recent Labs    06/14/22 1516  TSH 0.183*    Other results:  Imaging    No results found.   Medications:     Scheduled Medications:  dapagliflozin propanediol  10 mg Oral Daily   digoxin  0.125 mg Oral Daily   dofetilide  500 mcg Oral BID   metoprolol succinate  25 mg Oral Daily   rivaroxaban  20 mg Oral Q  supper   sodium chloride flush  3 mL Intravenous Q12H   sodium chloride flush  3 mL Intravenous Q12H   spironolactone  12.5 mg Oral Daily   torsemide  20 mg Oral Daily    Infusions:  sodium chloride     sodium chloride      PRN Medications: sodium chloride, sodium chloride, acetaminophen, sodium chloride flush, sodium chloride flush    Patient Profile   [name] is a 72 y.o. male with persistent afib (s/p ablation 2012, DCCV 09/12/17), HTN, and chronic systolic HF due to NICM.  Admitted with A/C  HFrEF and atrial flutter RVR.    Assessment/Plan  Acute on chronic systolic heart failure, NICM - Echo (1/24): EF 25-30%, moderate asymmetric LVH, RV moderately reduced, severe MR, mild to moderate TR - LHC (2012): normal  coronaries. No ICD.  - Volume status stable. Continue  20 Torsemide daily - Continue Toprol XL and digoxin.  Arlyce Harman restarted yesterday - Continue to hold entresto for today. Soft BP. Possible restart tomorrow - Continue Farxiga 10 mg daily - strict I&O, daily weights - Follow BMET daily.    Atrial flutter with RVR - Had Afib ablation 2012 and DC-CV 2019, 2020 and 2024 - intolerant of amio (itching) - Saw Dr. Curt Bears 2/24. Ablation offered but he decided to defer  -3/28 Started Tikosyn  - Back in SR over night.  - Continue digoxin and Toprol XL.  Continue xarelto daily.  - Possible ablation down the road.  - Will also need to complete home sleep study. He has the kit at home and plans to complete after discharge.    Severe MR -  MR is mild to moderate on echo 05/20/22    HTN - continues with soft BPs - Continue to hold entresto.   5. AKI on CKD Stage IIIa - Baseline creatinine ~ 1.5 - Creatinine on admit up to 1.9  - now down trending with diuresis. BMET pending.  - Avoid hypotension.  - As above hold entresto/spiro.   Length of Stay: 3  Amy Clegg, NP  06/17/2022, 7:18 AM  Advanced Heart Failure Team Pager [phone] (M-F; 7a - 5p)  Please contact [facility] for night-coverage after hours (5p -7a ) and weekends on [email or web address]  Patient seen and examined with the above-signed Advanced Practice Provider and/or Housestaff. I personally reviewed laboratory data, imaging studies and relevant notes. I independently examined the patient and formulated the important aspects of the plan. I have edited the note to reflect any of my changes or salient points. I have personally discussed the plan with the patient and/or family.  Feels good. Converted to NSR last night after 1st dose of Tikosyn, Got second dose this am.   Denies SOB, orthopnea or PND  K 4.5 Mg 2.3  QTc 456 on am ECG  General:  Well appearing. No resp difficulty HEENT: normal Neck: supple. JVP 7. Carotids 2+ bilat;  no bruits. No lymphadenopathy or thryomegaly appreciated. Cor: PMI nondisplaced. Regular rate & rhythm. No rubs, gallops or murmurs. Lungs: clear Abdomen: obese  soft, nontender, nondistended. No hepatosplenomegaly. No bruits or masses. Good bowel sounds. Extremities: no cyanosis, clubbing, rash, edema Neuro: alert & orientedx3, cranial nerves grossly intact. moves all 4 extremities w/o difficulty. Affect pleasant  Back in NSR with Tikosyn. QTc and labs ok.   Volume status looks much better on exam but weight unchanged.   Has had 2/5 doses of Tikosyn. I discussed risks with him. Will complete 5/5  doses Saturday night. Possibly home Sunday.   Appreciate EP's help.   Given HF stability will ask EP to round on him over the weekend.   Glori Bickers, MD  8:58 AM

## 2022-06-17 NOTE — Progress Notes (Signed)
Pharmacy: Dofetilide (Tikosyn) - Follow Up Assessment and Electrolyte Replacement  Pharmacy consulted to assist in monitoring and replacing electrolytes in this 72 y.o. male admitted on 06/14/2022 undergoing dofetilide initiation. First dofetilide dose: 06/16/22  Labs:    Component Value Date/Time   K 4.0 06/17/2022 1020   MG 2.2 06/17/2022 1020     Plan: Potassium: K >/= 4: No additional supplementation needed  Magnesium: Mg > 2: No additional supplementation needed    Einar Grad 06/17/2022  8:58 AM

## 2022-06-17 NOTE — Progress Notes (Signed)
Morning EKG reviewed     Shows remains in NSR with borderline QTc at ~480-490 ms. Reviewed with Dr. Curt Bears and we will continue same dose for now  Continue  Tikosyn 500 mcg BID.   Potassium4.0 (03/29 1020) Magnesium  2.2 (03/29 1020) Creatinine, ser  1.70* (03/28 2341)  Would plan for home Sunday after morning dose and EKG if QT remains stable.   Per HF team, would plan to resume [facility] tomorrow if Cr < 1.9 and systolic BP > [id number]   Nana Vastine Andrew [place], [place]  06/17/2022 12:26 PM

## 2022-06-17 NOTE — Progress Notes (Signed)
   Called to pharmacies to ensure stock of Tikosyn.   [facility] 60 tablets of 500 in stock as of 2pm 3/29.   They are open from 10-6 on Sunday with a lunch break from 130-2.  [facility] 46 capsules of 500 in stock Month supply of 250 in stock.  [place] None of either in stock at this time.    Cost with [facility] should be $40-45  I did not call [facility] or [facility], because at this time cost with [facility] ranges $120-140 for a 1 month supply.   If either of these places are out of stock on Sunday, pt will likely need to wait until Monday to get stock from [facility].

## 2022-06-17 NOTE — Progress Notes (Addendum)
Electrophysiology Rounding Note  Patient Name: [name] Date of Encounter: 06/17/2022  Primary Cardiologist: Glori Bickers, MD  Electrophysiologist: Constance Haw, MD    Subjective   Pt converted to sinus rhythm on Tikosyn 500 mcg BID   QTc from EKG last pm shows stable QTc at ~470-480 ms  The patient is doing well today.  At this time, the patient denies chest pain, shortness of breath, or any new concerns.  Inpatient Medications    Scheduled Meds:  dapagliflozin propanediol  10 mg Oral Daily   digoxin  0.125 mg Oral Daily   dofetilide  500 mcg Oral BID   metoprolol succinate  25 mg Oral Daily   rivaroxaban  20 mg Oral Q supper   sodium chloride flush  3 mL Intravenous Q12H   sodium chloride flush  3 mL Intravenous Q12H   spironolactone  12.5 mg Oral Daily   torsemide  20 mg Oral Daily   Continuous Infusions:  sodium chloride     sodium chloride     PRN Meds: sodium chloride, sodium chloride, acetaminophen, sodium chloride flush, sodium chloride flush   Vital Signs    Vitals:   06/16/22 1605 06/16/22 2023 06/16/22 2328 06/17/22 0328  BP: 93/74 98/77 101/79 111/64  Pulse: 79 91 73 71  Resp: 14 19 14 16   Temp: 97.8 F (36.6 C) 98.5 F (36.9 C) 98.2 F (36.8 C) 97.8 F (36.6 C)  TempSrc: Oral Oral Oral Oral  SpO2: 91% 93% 94% 94%  Weight:    114.3 kg  Height:        Intake/Output Summary (Last 24 hours) at 06/17/2022 0714 Last data filed at 06/16/2022 2300 Gross per 24 hour  Intake 840 ml  Output 1730 ml  Net -890 ml   Filed Weights   06/15/22 0640 06/16/22 0229 06/17/22 0328  Weight: 113.2 kg 113.2 kg 114.3 kg    Physical Exam    GEN- NAD, A&O x 3. Normal affect.  Lungs- CTAB, Normal effort.  Heart- Regular rate and rhythm. No M/G/R GI- Soft, NT, ND Extremities- No clubbing, cyanosis, or edema Skin- no rash or lesion  Labs    CBC Recent Labs    06/16/22 0011 06/16/22 2341  WBC 4.5 4.5  HGB 14.5 14.9  HCT 42.3 43.3   MCV 94.2 95.8  PLT 144* 123456*   Basic Metabolic Panel Recent Labs    06/16/22 1210 06/16/22 2341  NA 135 131*  K 4.0 4.5  CL 97* 94*  CO2 28 28  GLUCOSE 117* 97  BUN 20 28*  CREATININE 1.53* 1.70*  CALCIUM 9.2 9.0  MG 2.3 2.3    Telemetry    Converted to NSR ~ 2200 last night, NSR 70-90s since (personally reviewed)  Patient Profile     [name] is a 72 y.o. male with a past medical history significant for persistent atrial fibrillation.  They were admitted for tikosyn load.   Assessment & Plan   Persistent atrial fibrillation Pt converted to sinus rhythm on Tikosyn 500 mcg BID  Continue Xarelto Creatinine, ser  1.70* (03/28 2341) Magnesium  2.3 (03/28 2341) Potassium4.5 (03/28 2341) No electrolyte supplementation needed  Acute on chronic systolic CHF Volume status improved.  Cr 1.7 this am, follow closely.  Back on spiro, but still off Entresto.     For questions or updates, please contact [place] Please consult [email or web address] for contact info under Cardiology/STEMI.  Signed, Shirley Friar, PA-C  06/17/2022, 7:14  AM   I have seen and examined this patient with Oda Kilts.  Agree with above, note added to reflect my findings.  Feeling well.  No chest pain or shortness of breath.  GEN: Well nourished, well developed, in no acute distress  HEENT: normal  Neck: no JVD, carotid bruits, or masses Cardiac: RRR; no murmurs, rubs, or gallops,no edema  Respiratory:  clear to auscultation bilaterally, normal work of breathing GI: soft, nontender, nondistended, + BS MS: no deformity or atrophy  Skin: warm and dry Neuro:  Strength and sensation are intact Psych: euthymic mood, full affect   Persistent atrial fibrillation/atrial flutter: Has current to sinus rhythm on dofetilide.  Kaiah Hosea continue at current dosing.  Electrolyte supplementation per protocol. Acute on chronic systolic heart failure: Has diuresed well per heart failure team. Duane No  M. Liana Camerer MD 06/17/2022 10:08 AM

## 2022-06-18 DIAGNOSIS — I484 Atypical atrial flutter: Secondary | ICD-10-CM | POA: Diagnosis not present

## 2022-06-18 LAB — BASIC METABOLIC PANEL
Anion gap: 7 (ref 5–15)
BUN: 28 mg/dL — ABNORMAL HIGH (ref 8–23)
CO2: 30 mmol/L (ref 22–32)
Calcium: 8.9 mg/dL (ref 8.9–10.3)
Chloride: 97 mmol/L — ABNORMAL LOW (ref 98–111)
Creatinine, Ser: 1.74 mg/dL — ABNORMAL HIGH (ref 0.61–1.24)
GFR, Estimated: 41 mL/min — ABNORMAL LOW (ref >60)
Glucose, Bld: 117 mg/dL — ABNORMAL HIGH (ref 70–99)
Potassium: 4.2 mmol/L (ref 3.5–5.1)
Sodium: 134 mmol/L — ABNORMAL LOW (ref 135–145)

## 2022-06-18 LAB — CBC
HCT: 39.4 % (ref 39.0–52.0)
Hemoglobin: 13.6 g/dL (ref 13.0–17.0)
MCH: 32.2 pg (ref 26.0–34.0)
MCHC: 34.5 g/dL (ref 30.0–36.0)
MCV: 93.4 fL (ref 80.0–100.0)
Platelets: 138 10*3/uL — ABNORMAL LOW (ref 150–400)
RBC: 4.22 MIL/uL (ref 4.22–5.81)
RDW: 13.6 % (ref 11.5–15.5)
WBC: 4.4 10*3/uL (ref 4.0–10.5)
nRBC: 0 % (ref 0.0–0.2)

## 2022-06-18 LAB — MAGNESIUM: Magnesium: 2.2 mg/dL (ref 1.7–2.4)

## 2022-06-18 MED ORDER — DOFETILIDE 250 MCG PO CAPS
250.0000 ug | ORAL_CAPSULE | Freq: Two times a day (BID) | ORAL | Status: DC
  Administered 2022-06-18 – 2022-06-20 (×5): 250 ug via ORAL
  Filled 2022-06-18 (×6): qty 1

## 2022-06-18 NOTE — Progress Notes (Signed)
Pharmacy: Dofetilide (Tikosyn) - Follow Up Assessment and Electrolyte Replacement  Pharmacy consulted to assist in monitoring and replacing electrolytes in this 72 y.o. male admitted on 06/14/2022 undergoing dofetilide initiation. First dofetilide dose: 06/16/22  Labs:    Component Value Date/Time   K 4.2 06/18/2022 0018   MG 2.2 06/18/2022 0018     Plan: Potassium: K >/= 4: No additional supplementation needed  Magnesium: Mg > 2: No additional supplementation needed   Thank you for allowing pharmacy to participate in this patient's care   Ursula Beath 06/18/2022  12:48 PM

## 2022-06-18 NOTE — Plan of Care (Signed)

## 2022-06-18 NOTE — Progress Notes (Signed)
Electrophysiology Rounding Note  Patient Name: [name] Date of Encounter: 06/18/2022  Primary Cardiologist: Glori Bickers, MD  Electrophysiologist: Constance Haw, MD    Subjective   Remains in sinus rhythm. Feeling well without chest pain or SOB.  Inpatient Medications    Scheduled Meds:  dapagliflozin propanediol  10 mg Oral Daily   digoxin  0.125 mg Oral Daily   dofetilide  250 mcg Oral BID   metoprolol succinate  25 mg Oral Daily   rivaroxaban  20 mg Oral Q supper   sodium chloride flush  3 mL Intravenous Q12H   sodium chloride flush  3 mL Intravenous Q12H   spironolactone  12.5 mg Oral Daily   torsemide  20 mg Oral Daily   Continuous Infusions:  sodium chloride     sodium chloride     PRN Meds: sodium chloride, sodium chloride, acetaminophen, sodium chloride flush, sodium chloride flush   Vital Signs    Vitals:   06/17/22 1934 06/17/22 2328 06/18/22 0429 06/18/22 0500  BP: (!) 105/55 108/62 112/64   Pulse: 60 (!) 59 (!) 57   Resp: 16 17 16    Temp: 97.9 F (36.6 C) 97.9 F (36.6 C) 97.6 F (36.4 C)   TempSrc: Oral Oral Oral   SpO2: 95% 97% 98%   Weight:    113.8 kg  Height:        Intake/Output Summary (Last 24 hours) at 06/18/2022 0723 Last data filed at 06/17/2022 2300 Gross per 24 hour  Intake 118 ml  Output 2325 ml  Net -2207 ml    Filed Weights   06/16/22 0229 06/17/22 0328 06/18/22 0500  Weight: 113.2 kg 114.3 kg 113.8 kg    Physical Exam    GEN: Well nourished, well developed, in no acute distress  HEENT: normal  Neck: no JVD, carotid bruits, or masses Cardiac: RRR; no murmurs, rubs, or gallops,no edema  Respiratory:  clear to auscultation bilaterally, normal work of breathing GI: soft, nontender, nondistended, + BS MS: no deformity or atrophy  Skin: warm and dry Neuro:  Strength and sensation are intact Psych: euthymic mood, full affect   Labs    CBC Recent Labs    06/16/22 2341 06/18/22 0018  WBC 4.5 4.4   HGB 14.9 13.6  HCT 43.3 39.4  MCV 95.8 93.4  PLT 144* 138*    Basic Metabolic Panel Recent Labs    06/16/22 2341 06/17/22 1020 06/18/22 0018  NA 131*  --  134*  K 4.5 4.0 4.2  CL 94*  --  97*  CO2 28  --  30  GLUCOSE 97  --  117*  BUN 28*  --  28*  CREATININE 1.70*  --  1.74*  CALCIUM 9.0  --  8.9  MG 2.3 2.2 2.2     Telemetry    Converted to NSR ~ 2200 last night, NSR 70-90s since (personally reviewed)  Patient Profile     [name] is a 72 y.o. male with a past medical history significant for persistent atrial fibrillation.  They were admitted for tikosyn load.   Assessment & Plan   Persistent atrial fibrillation:  Converted to sinus rhythm after first dose. QTc prolonged and thus dose reduced today. Continue xarelto. Creatinine, ser  1.74* (03/30 0018) Magnesium  2.2 (03/30 0018) Potassium4.2 (03/30 0018) No electrolyte supplementation needed  Acute on chronic systolic heart failure: Volume status improved. On PO diuresis.  Plan to add back entresto today pending BP  Severe MR Mild to moderate on repeat echo  Hypertension:  BP remain soft. [name] monitor today for adding entresto  AKI on CKD IIIa:  Creatinine close to baseline. Continue current management.    For questions or updates, please contact [place] Please consult [email or web address] for contact info under Cardiology/STEMI.  Signed, Duane Abril Meredith Leeds, MD  06/18/2022, 7:23 AM   I have seen and examined this patient with [name].  Agree with above, note added to reflect my findings.  Feeling well.  No chest pain or shortness of breath.  GEN: Well nourished, well developed, in no acute distress  HEENT: normal  Neck: no JVD, carotid bruits, or masses Cardiac: RRR; no murmurs, rubs, or gallops,no edema  Respiratory:  clear to auscultation bilaterally, normal work of breathing GI: soft, nontender, nondistended, + BS MS: no deformity or atrophy  Skin: warm and dry Neuro:   Strength and sensation are intact Psych: euthymic mood, full affect   Persistent atrial fibrillation/atrial flutter: Has current to sinus rhythm on dofetilide.  [name] continue at current dosing.  Electrolyte supplementation per protocol. Acute on chronic systolic heart failure: Has diuresed well per heart failure team. Duane Mellette M. Clella Mckeel MD 06/18/2022 7:23 AM

## 2022-06-19 DIAGNOSIS — I484 Atypical atrial flutter: Secondary | ICD-10-CM | POA: Diagnosis not present

## 2022-06-19 LAB — CBC
HCT: 39.2 % (ref 39.0–52.0)
Hemoglobin: 13.6 g/dL (ref 13.0–17.0)
MCH: 32.2 pg (ref 26.0–34.0)
MCHC: 34.7 g/dL (ref 30.0–36.0)
MCV: 92.7 fL (ref 80.0–100.0)
Platelets: 131 10*3/uL — ABNORMAL LOW (ref 150–400)
RBC: 4.23 MIL/uL (ref 4.22–5.81)
RDW: 13.5 % (ref 11.5–15.5)
WBC: 4.3 10*3/uL (ref 4.0–10.5)
nRBC: 0 % (ref 0.0–0.2)

## 2022-06-19 LAB — BASIC METABOLIC PANEL
Anion gap: 6 (ref 5–15)
BUN: 26 mg/dL — ABNORMAL HIGH (ref 8–23)
CO2: 28 mmol/L (ref 22–32)
Calcium: 9 mg/dL (ref 8.9–10.3)
Chloride: 100 mmol/L (ref 98–111)
Creatinine, Ser: 1.44 mg/dL — ABNORMAL HIGH (ref 0.61–1.24)
GFR, Estimated: 52 mL/min — ABNORMAL LOW (ref >60)
Glucose, Bld: 92 mg/dL (ref 70–99)
Potassium: 3.9 mmol/L (ref 3.5–5.1)
Sodium: 134 mmol/L — ABNORMAL LOW (ref 135–145)

## 2022-06-19 LAB — MAGNESIUM: Magnesium: 2.4 mg/dL (ref 1.7–2.4)

## 2022-06-19 MED ORDER — SACUBITRIL-VALSARTAN 24-26 MG PO TABS
1.0000 | ORAL_TABLET | Freq: Two times a day (BID) | ORAL | Status: DC
  Administered 2022-06-19 – 2022-06-20 (×3): 1 via ORAL
  Filled 2022-06-19 (×3): qty 1

## 2022-06-19 MED ORDER — POLYETHYLENE GLYCOL 3350 17 G PO PACK
17.0000 g | PACK | Freq: Every day | ORAL | Status: DC
  Administered 2022-06-19: 17 g via ORAL
  Filled 2022-06-19: qty 1

## 2022-06-19 MED ORDER — POTASSIUM CHLORIDE CRYS ER 20 MEQ PO TBCR
40.0000 meq | EXTENDED_RELEASE_TABLET | Freq: Once | ORAL | Status: AC
  Administered 2022-06-19: 40 meq via ORAL
  Filled 2022-06-19: qty 2

## 2022-06-19 NOTE — Progress Notes (Signed)
Pharmacy: Dofetilide (Tikosyn) - Follow Up Assessment and Electrolyte Replacement  Pharmacy consulted to assist in monitoring and replacing electrolytes in this 72 y.o. male admitted on 06/14/2022 undergoing dofetilide initiation. First dofetilide dose: 06/16/22   Labs:    Component Value Date/Time   K 3.9 06/19/2022 0018   MG 2.4 06/19/2022 0018     Plan: Potassium: K 3.8-3.9:  Give KCl 40 mEq po x1   Magnesium: Mg > 2: No additional supplementation needed   Thank you for allowing pharmacy to participate in this patient's care   Ursula Beath 06/19/2022  2:39 PM

## 2022-06-19 NOTE — Progress Notes (Signed)
Electrophysiology Rounding Note  Patient Name: [name] Date of Encounter: 06/19/2022  Primary Cardiologist: Glori Bickers, MD  Electrophysiologist: Constance Haw, MD    Subjective   Had a run of SVT this morning which was asymptomatic.  Remains in sinus rhythm.  Feeling well.  Inpatient Medications    Scheduled Meds:  dapagliflozin propanediol  10 mg Oral Daily   digoxin  0.125 mg Oral Daily   dofetilide  250 mcg Oral BID   metoprolol succinate  25 mg Oral Daily   rivaroxaban  20 mg Oral Q supper   sacubitril-valsartan  1 tablet Oral BID   sodium chloride flush  3 mL Intravenous Q12H   sodium chloride flush  3 mL Intravenous Q12H   spironolactone  12.5 mg Oral Daily   torsemide  20 mg Oral Daily   Continuous Infusions:  sodium chloride     sodium chloride     PRN Meds: sodium chloride, sodium chloride, acetaminophen, sodium chloride flush, sodium chloride flush   Vital Signs    Vitals:   06/18/22 2223 06/19/22 0336 06/19/22 0342 06/19/22 0725  BP: 106/60 109/78  118/72  Pulse: 60 65  65  Resp: 17 15  16   Temp: 98.6 F (37 C) 98.3 F (36.8 C)  98.3 F (36.8 C)  TempSrc: Oral Oral  Oral  SpO2: 94% 95%    Weight:   112.9 kg   Height:        Intake/Output Summary (Last 24 hours) at 06/19/2022 0847 Last data filed at 06/19/2022 0725 Gross per 24 hour  Intake 243 ml  Output 1950 ml  Net -1707 ml    Filed Weights   06/17/22 0328 06/18/22 0500 06/19/22 0342  Weight: 114.3 kg 113.8 kg 112.9 kg    Physical Exam    GEN: Well nourished, well developed, in no acute distress  HEENT: normal  Neck: no JVD, carotid bruits, or masses Cardiac: RRR; no murmurs, rubs, or gallops,no edema  Respiratory:  clear to auscultation bilaterally, normal work of breathing GI: soft, nontender, nondistended, + BS MS: no deformity or atrophy  Skin: warm and dry Neuro:  Strength and sensation are intact Psych: euthymic mood, full affect   Labs     CBC Recent Labs    06/18/22 0018 06/19/22 0018  WBC 4.4 4.3  HGB 13.6 13.6  HCT 39.4 39.2  MCV 93.4 92.7  PLT 138* 131*    Basic Metabolic Panel Recent Labs    06/18/22 0018 06/19/22 0018  NA 134* 134*  K 4.2 3.9  CL 97* 100  CO2 30 28  GLUCOSE 117* 92  BUN 28* 26*  CREATININE 1.74* 1.44*  CALCIUM 8.9 9.0  MG 2.2 2.4     Telemetry    Sinus rhythm-personally reviewed  Patient Profile     [name] is a 72 y.o. male with a past medical history significant for persistent atrial fibrillation.  They were admitted for tikosyn load.   Assessment & Plan   1.  Persistent atrial fibrillation: Converted to sinus rhythm after first dose.  QTc prolonged and thus dose reduced.  Continue Xarelto. Creatinine, ser  1.44* (03/31 0018) Magnesium  2.4 (03/31 0018) Potassium3.9 (03/31 0018) No electrolyte supplementation needed  2.  Acute on chronic systolic heart failure: Volume status improved on p.o. diuresis.  Entresto added back today.  3.  Severe mitral regurgitation: Mild to moderate on repeat echo.  4.  Hypertension: Blood pressure improved.  Entresto added today.  5.  Acute on chronic CKD stage IIIa: Creatinine continues to improve.  Continue with current management.    For questions or updates, please contact [place] Please consult [email or web address] for contact info under Cardiology/STEMI.  Signed, Kamaree Berkel Meredith Leeds, MD  06/19/2022, 8:47 AM

## 2022-06-19 NOTE — Plan of Care (Signed)

## 2022-06-20 ENCOUNTER — Other Ambulatory Visit (HOSPITAL_COMMUNITY): Payer: Self-pay

## 2022-06-20 DIAGNOSIS — I5043 Acute on chronic combined systolic (congestive) and diastolic (congestive) heart failure: Secondary | ICD-10-CM | POA: Diagnosis not present

## 2022-06-20 DIAGNOSIS — I4819 Other persistent atrial fibrillation: Secondary | ICD-10-CM | POA: Diagnosis not present

## 2022-06-20 LAB — BASIC METABOLIC PANEL
Anion gap: 9 (ref 5–15)
BUN: 29 mg/dL — ABNORMAL HIGH (ref 8–23)
CO2: 29 mmol/L (ref 22–32)
Calcium: 9.1 mg/dL (ref 8.9–10.3)
Chloride: 95 mmol/L — ABNORMAL LOW (ref 98–111)
Creatinine, Ser: 1.45 mg/dL — ABNORMAL HIGH (ref 0.61–1.24)
GFR, Estimated: 52 mL/min — ABNORMAL LOW (ref >60)
Glucose, Bld: 108 mg/dL — ABNORMAL HIGH (ref 70–99)
Potassium: 4.6 mmol/L (ref 3.5–5.1)
Sodium: 133 mmol/L — ABNORMAL LOW (ref 135–145)

## 2022-06-20 LAB — CBC
HCT: 43.1 % (ref 39.0–52.0)
Hemoglobin: 14.6 g/dL (ref 13.0–17.0)
MCH: 32.1 pg (ref 26.0–34.0)
MCHC: 33.9 g/dL (ref 30.0–36.0)
MCV: 94.7 fL (ref 80.0–100.0)
Platelets: 141 10*3/uL — ABNORMAL LOW (ref 150–400)
RBC: 4.55 MIL/uL (ref 4.22–5.81)
RDW: 13.3 % (ref 11.5–15.5)
WBC: 4.3 10*3/uL (ref 4.0–10.5)
nRBC: 0 % (ref 0.0–0.2)

## 2022-06-20 LAB — MAGNESIUM: Magnesium: 2.5 mg/dL — ABNORMAL HIGH (ref 1.7–2.4)

## 2022-06-20 MED ORDER — TORSEMIDE 20 MG PO TABS
20.0000 mg | ORAL_TABLET | Freq: Every day | ORAL | 5 refills | Status: DC
  Filled 2022-06-20: qty 30, 30d supply, fill #0
  Filled 2022-06-28 – 2022-07-19 (×3): qty 30, 30d supply, fill #1
  Filled 2022-08-14 – 2022-08-24 (×2): qty 30, 30d supply, fill #2

## 2022-06-20 MED ORDER — DOFETILIDE 250 MCG PO CAPS
250.0000 ug | ORAL_CAPSULE | Freq: Two times a day (BID) | ORAL | 5 refills | Status: DC
  Filled 2022-06-20: qty 60, 30d supply, fill #0
  Filled 2022-06-28 – 2022-07-15 (×2): qty 60, 30d supply, fill #1
  Filled 2022-07-26 – 2022-08-14 (×2): qty 60, 30d supply, fill #2
  Filled 2022-09-18: qty 60, 30d supply, fill #3
  Filled 2022-10-14: qty 60, 30d supply, fill #4
  Filled 2022-11-13: qty 60, 30d supply, fill #5

## 2022-06-20 MED ORDER — SPIRONOLACTONE 25 MG PO TABS
12.5000 mg | ORAL_TABLET | Freq: Every day | ORAL | 3 refills | Status: DC
  Filled 2022-06-20: qty 15, 30d supply, fill #0
  Filled 2022-06-28 – 2022-07-02 (×2): qty 15, 30d supply, fill #1
  Filled 2022-07-04: qty 15, 30d supply, fill #0

## 2022-06-20 NOTE — Progress Notes (Addendum)
Pharmacy: Dofetilide (Tikosyn) - Follow Up Assessment and Electrolyte Replacement  Pharmacy consulted to assist in monitoring and replacing electrolytes in this 72 y.o. male admitted on 06/14/2022 undergoing dofetilide initiation. First dofetilide dose: 06/16/2022.  Labs:    Component Value Date/Time   K 4.6 06/20/2022 0018   MG 2.5 (H) 06/20/2022 0018     Plan: Potassium: K >/= 4: No additional supplementation needed  Magnesium: Mg > 2: No additional supplementation needed  As patient has required on average ~12 mEq of potassium replacement every day, recommend no additional potassium supplementation at discharge.  Thank you for allowing pharmacy to participate in this patient's care   Antonietta Jewel, PharmD, [place] Pharmacist  Phone: [phone] 06/20/2022 8:19 AM  Please check AMION for all [place] phone numbers After 10:00 PM, call [place] [phone]

## 2022-06-20 NOTE — Discharge Summary (Addendum)
ELECTROPHYSIOLOGY PROCEDURE DISCHARGE SUMMARY    Patient ID: [name],  MRN: [id number], DOB/AGE: 10/13/1950 72 y.o.  Admit date: 06/14/2022 Discharge date: 06/20/2022  Primary Care Physician: Lucianne Lei, MD Primary Cardiologist: Dr. Haroldine Laws Electrophysiologist: Dr. Curt Bears  Primary Discharge Diagnosis:  Acute on chronic CHF exacerbation 2.  persistent atrial fibrillation/rapid aflutter status post Tikosyn loading this admission      CHA2DS2Vasc is 3, on xarelto      (Hx of MAZE and prior convergent ablation remotely, and intolerance to amiodarone)  Secondary Discharge Diagnosis:  NICM HTN  Allergies  Allergen Reactions   Pacerone [Amiodarone] Itching and Rash     Procedures This Admission:  1.  Tikosyn loading    Brief HPI: [name] is a 72 y.o. male with a past medical history as noted above.  Followed in the put patient setting by both EP and AHF teams.  Noted at his HF clinic visit to be in AFlutter w/RVR and volume overloaded, admitted for diuresis and rhythm management.    Hospital Course:  The patient was admitted and diuresed, unna boots placed, volume status improved, EP brought on board for Tikosyn loading.Phyllis Ginger was initiated.  Renal function and electrolytes were followed during the hospitalization.  The patient's QTc lengthened requiring dose reduction then remained stable.  He converted with drug and did not require DCCV.  He was monitored until discharge on telemetry which demonstrated SR/SB.  On the day of discharge, he feels well, was examined by Dr Aundra Dubin and Dr. Quentin Ore who considered the patient stable for discharge to home.  Follow-up has been arranged with the EP service in 1 week and with Dr Curt Bears in 4 weeks.   HF team follow up as well is in place Home HF recommendations: - Continue 20 torsemide daily - Continue Toprol XL and digoxin.  - Continue Spiro 12.5 mg daily - Continue Entresto 24-26 mg BID - Continue Farxiga 10  mg daily   Tikosyn teaching was completed He got his dose this AM a little late, discussed this evening's dose timing to be about 9:30pm, with the patient, he usually takes his meds at home about 0900 and 2100, so he will be on track for his morning dose, he is clear on the instructions No new or additional electrolyte replacement for home  Will need to revisit EF with maintenance of SR and consider ICD if not improved.   Physical Exam: Vitals:   06/20/22 0427 06/20/22 0814 06/20/22 0934 06/20/22 1258  BP: 101/65 98/71 104/71 100/66  Pulse: (!) 58 72    Resp: 18 17  15   Temp: 97.7 F (36.5 C) 97.9 F (36.6 C)    TempSrc: Oral Oral    SpO2: 97% 96%    Weight: 113.4 kg     Height:         GEN- The patient is well appearing, alert and oriented x 3 today.   HEENT: normocephalic, atraumatic; sclera clear, conjunctiva pink; hearing intact; oropharynx clear; neck supple, no JVP Lymph- no cervical lymphadenopathy Lungs- CTA b/l, normal work of breathing.  No wheezes, rales, rhonchi Heart- RRR, no murmurs, rubs or gallops, PMI not laterally displaced GI- soft, non-tender, non-distended Extremities- 1+ edema, no clubbing, cyanosis, or edema MS- no significant deformity or atrophy Skin- warm and dry, no rash or lesion Psych- euthymic mood, full affect Neuro- strength and sensation are intact   Labs:   Lab Results  Component Value Date   WBC 4.3  06/20/2022   HGB 14.6 06/20/2022   HCT 43.1 06/20/2022   MCV 94.7 06/20/2022   PLT 141 (L) 06/20/2022    Recent Labs  Lab 06/14/22 1516 06/15/22 0022 06/20/22 0018  NA 139   < > 133*  K 3.7   < > 4.6  CL 97*   < > 95*  CO2 31   < > 29  BUN 17   < > 29*  CREATININE 1.94*   < > 1.45*  CALCIUM 9.5   < > 9.1  PROT 7.5  --   --   BILITOT 0.9  --   --   ALKPHOS 69  --   --   ALT 21  --   --   AST 25  --   --   GLUCOSE 102*   < > 108*   < > = values in this interval not displayed.     Discharge Medications:  Allergies as  of 06/20/2022       Reactions   Pacerone [amiodarone] Itching, Rash        Medication List     TAKE these medications    dapagliflozin propanediol 10 MG Tabs tablet Commonly known as: FARXIGA Take 1 tablet (10 mg total) by mouth daily.   digoxin 0.125 MG tablet Commonly known as: LANOXIN Take 1 tablet (0.125 mg total) by mouth daily.   dofetilide 250 MCG capsule Commonly known as: TIKOSYN Take 1 capsule (250 mcg total) by mouth 2 (two) times daily.   Entresto 24-26 MG Generic drug: sacubitril-valsartan Take 1 tablet by mouth 2 (two) times daily.   metoprolol succinate 25 MG 24 hr tablet Commonly known as: TOPROL-XL Take 1 tablet (25 mg total) by mouth daily.   OVER THE COUNTER MEDICATION Take 5 mLs by mouth in the morning. Black seed oil.   sildenafil 25 MG tablet Commonly known as: VIAGRA Take 1 tablet (25 mg total) by mouth daily as needed for erectile dysfunction.   spironolactone 25 MG tablet Commonly known as: ALDACTONE Take 0.5 tablets (12.5 mg total) by mouth daily. Start taking on: June 21, 2022   torsemide 20 MG tablet Commonly known as: DEMADEX Take 1 tablet (20 mg total) by mouth daily. What changed: how much to take   Xarelto 20 MG Tabs tablet Generic drug: rivaroxaban Take 1 tablet (20 mg total) by mouth daily with supper.        Disposition:  Discharge Instructions     Diet - low sodium heart healthy   Complete by: As directed    Increase activity slowly   Complete by: As directed        Follow-up Information     Lucianne Lei, MD Follow up.   Specialty: Family Medicine Why: appointment on June 28, 2022 at 1:30 pm Contact information: [address] [place] [address] [phone]         [facility] Follow up on 06/29/2022.   Specialty: Cardiology Why: Follow up in the [facility] 06/29/22 at 1:30pm Entrance C, free valet Contact information: [id number] [place] [address] [phone]                Duration of Discharge Encounter: Greater than 30 minutes including physician time.  Venetia Night, PA-C 06/20/2022 1:21 PM

## 2022-06-20 NOTE — Progress Notes (Addendum)
Advanced Heart Failure Rounding Note  PCP-Cardiologist: Glori Bickers, MD   Subjective:   3/26 Diuresing with IV lasix. Negative 2.2 liters. EP aware of admit plan to Tikosyn load once diuresed. 3/28 Tikosyn load started.  Converted to SR  Has completed Tikosyn load. Remains in NSR  Overall a good weekend. Ambulated around the unit, no complaints.   Objective:   Weight Range: 113.4 kg Body mass index is 30.43 kg/m.   Vital Signs:   Temp:  [97.7 F (36.5 C)-98.3 F (36.8 C)] 97.7 F (36.5 C) (04/01 0427) Pulse Rate:  [56-67] 58 (04/01 0427) Resp:  [16-20] 18 (04/01 0427) BP: (101-118)/(56-72) 101/65 (04/01 0427) SpO2:  [97 %-98 %] 97 % (04/01 0427) Weight:  [113.4 kg] 113.4 kg (04/01 0427) Last BM Date : 06/14/22  Weight change: Filed Weights   06/18/22 0500 06/19/22 0342 06/20/22 0427  Weight: 113.8 kg 112.9 kg 113.4 kg   Intake/Output:   Intake/Output Summary (Last 24 hours) at 06/20/2022 0703 Last data filed at 06/20/2022 0427 Gross per 24 hour  Intake 240 ml  Output 2450 ml  Net -2210 ml    Physical Exam   General:  well appearing.  No respiratory difficulty HEENT: normal Neck: supple. No JVD. Carotids 2+ bilat; no bruits. No lymphadenopathy or thyromegaly appreciated. Cor: PMI nondisplaced. Regular rate & rhythm. No rubs, gallops or murmurs. Lungs: clear Abdomen: soft, nontender, nondistended. No hepatosplenomegaly. No bruits or masses. Good bowel sounds. Extremities: no cyanosis, clubbing, rash, edema. + UNNA boots Neuro: alert & oriented x 3, cranial nerves grossly intact. moves all 4 extremities w/o difficulty. Affect pleasant.  Telemetry   NSR 60s (Personally reviewed)    EKG  NSR with PVCs, prolonged QT 466. 60 bpm  Labs    CBC Recent Labs    06/19/22 0018 06/20/22 0018  WBC 4.3 4.3  HGB 13.6 14.6  HCT 39.2 43.1  MCV 92.7 94.7  PLT 131* [id number]*   Basic Metabolic Panel Recent Labs    06/19/22 0018 06/20/22 0018  NA 134* 133*  K  3.9 4.6  CL 100 95*  CO2 28 29  GLUCOSE 92 108*  BUN 26* 29*  CREATININE 1.44* 1.45*  CALCIUM 9.0 9.1  MG 2.4 2.5*   Liver Function Tests No results for input(s): "AST", "ALT", "ALKPHOS", "BILITOT", "PROT", "ALBUMIN" in the last 72 hours.  No results for input(s): "LIPASE", "AMYLASE" in the last 72 hours. Cardiac Enzymes No results for input(s): "CKTOTAL", "CKMB", "CKMBINDEX", "TROPONINI" in the last 72 hours.  BNP: BNP (last 3 results) Recent Labs    04/08/22 1532 05/03/22 1429 06/14/22 1516  BNP 66.0 94.4 104.2*   ProBNP (last 3 results) No results for input(s): "PROBNP" in the last 8760 hours.  D-Dimer No results for input(s): "DDIMER" in the last 72 hours. Hemoglobin A1C No results for input(s): "HGBA1C" in the last 72 hours.  Fasting Lipid Panel No results for input(s): "CHOL", "HDL", "LDLCALC", "TRIG", "CHOLHDL", "LDLDIRECT" in the last 72 hours. Thyroid Function Tests No results for input(s): "TSH", "T4TOTAL", "T3FREE", "THYROIDAB" in the last 72 hours.  Invalid input(s): "FREET3"  Other results:  Imaging   No results found.  Medications:     Scheduled Medications:  dapagliflozin propanediol  10 mg Oral Daily   digoxin  0.125 mg Oral Daily   dofetilide  250 mcg Oral BID   metoprolol succinate  25 mg Oral Daily   polyethylene glycol  17 g Oral Daily   rivaroxaban  20 mg  Oral Q supper   sacubitril-valsartan  1 tablet Oral BID   sodium chloride flush  3 mL Intravenous Q12H   sodium chloride flush  3 mL Intravenous Q12H   spironolactone  12.5 mg Oral Daily   torsemide  20 mg Oral Daily    Infusions:  sodium chloride     sodium chloride      PRN Medications: sodium chloride, sodium chloride, acetaminophen, sodium chloride flush, sodium chloride flush  Patient Profile  [name] is a 72 y.o. male with persistent afib (s/p ablation 2012, DCCV 09/12/17), HTN, and chronic systolic HF due to NICM.  Admitted with A/C  HFrEF and atrial flutter  RVR.   Assessment/Plan  Acute on chronic systolic heart failure, NICM - Echo (1/24): EF 25-30%, moderate asymmetric LVH, RV moderately reduced, severe MR, mild to moderate TR - LHC (2012): normal coronaries. No ICD.  - Volume status stable. Continue 20 Torsemide daily - Continue Toprol XL and digoxin.  - Continue Spiro 12.5 mg daily - Continue Entresto 24-26 mg BID - Continue Farxiga 10 mg daily - strict I&O, daily weights - Follow BMET daily.    Atrial flutter with RVR - Had Afib ablation 2012 and DC-CV 2019, 2020 and 2024 - intolerant of amio (itching) - Saw Dr. Curt Bears 2/24. Ablation offered but he decided to defer  - 3/28 Started Tikosyn. Converted to NSR.  - Continue digoxin and Toprol XL.  Continue xarelto daily.  - Possible ablation down the road.  - Will also need to complete home sleep study. He has the kit at home and plans to complete after discharge.    Severe MR -  MR is mild to moderate on echo 05/20/22    HTN - BP stable, back on Entresto 24-26 mg BID   5. AKI on CKD Stage IIIa - Baseline creatinine ~ 1.5 - Creatinine on admit up to 1.9  - SCr 1.45 today, stable - Avoid hypotension.   Appears stable for discharge today, will get meds from [facility]. F/u scheduled in clinic and with EP.   Length of Stay: [place], NP  06/20/2022, 7:03 AM  Advanced Heart Failure Team Pager [phone] (M-F; 7a - 5p)  Please contact [facility] for night-coverage after hours (5p -7a ) and weekends on [email or web address]  Patient seen with NP, agree with the above note.   Creatinine stable 1.45.  He remains in NSR on Tikosyn, QTc is acceptable.    No complaints this morning, denies dyspnea.   General: NAD Neck: JVP 8 cm, no thyromegaly or thyroid nodule.  Lungs: Clear to auscultation bilaterally with normal respiratory effort. CV: Nondisplaced PMI.  Heart regular S1/S2, no S3/S4, no murmur.  Trace ankle edema.   Abdomen: Soft, nontender, no hepatosplenomegaly, no  distention.  Skin: Intact without lesions or rashes.  Neurologic: Alert and oriented x 3.  Psych: Normal affect. Extremities: No clubbing or cyanosis.  HEENT: Normal.   I think he is ready to go home today.  He can continue the current cardiac medications.  Will arrange close followup in CHF clinic.   Loralie Champagne 06/20/2022 10:40 AM

## 2022-06-22 NOTE — Progress Notes (Unsigned)
Cardiology Office Note Date:  06/27/2022  Patient ID:  Duane, Mcmillan 07/14/1950, MRN [id number] PCP:  Renaye Rakers, MD  Cardiologist:  Dr. Gala Romney Electrophysiologist: Dr. Elberta Fortis    Chief Complaint:  [name]  History of Present Illness: [name] is a 72 y.o. male with history of HTN, NICM, chronic CHF (systolic), AFib.  He was recently admitted 06/14/22 via the HF clinic with acute/chronic CHF exacerbation and rapid Aflutter.  He was diuresed and then started on Tikosyn. He did have some QT lengthening and dose reduced with stabilization. Discharged 06/20/22.   TODAY He feels well. Weight has wobbled a couple pounds up/down, though not feeling bloated, swollen, SOB He was not aware of his Afib/flutter, no palpitations/overt awareness No near syncope or syncope. 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 2009 EF 25%; b. 05/2010 Cath: nl cors. EF 45%; c. 12/2017 Echo: EF 45-50%, diff HK, mild MR, sev dil LA.   Paroxysmal atrial flutter    a. Dx in 05/2018.   Persistent atrial fibrillation    a. 2012 s/p RFCA; b. 08/2017 s/p DCCV.    Past Surgical History:  Procedure Laterality Date   CARDIAC ELECTROPHYSIOLOGY STUDY AND ABLATION  2012   Dr. Riccardo Dubin   CARDIOVERSION N/A 09/12/2017   Procedure: CARDIOVERSION;  Surgeon: Jake Bathe, MD;  Location: [facility] ENDOSCOPY;  Service: Cardiovascular;  Laterality: N/A;   CARDIOVERSION N/A 03/25/2022   Procedure: CARDIOVERSION;  Surgeon: Laurey Morale, MD;  Location: [facility] ENDOSCOPY;  Service: Cardiovascular;  Laterality: N/A;   LOOP RECORDER IMPLANT  06/11/2010   TEE WITHOUT  CARDIOVERSION N/A 09/12/2017   Procedure: TRANSESOPHAGEAL ECHOCARDIOGRAM (TEE);  Surgeon: Jake Bathe, MD;  Location: [facility] ENDOSCOPY;  Service: Cardiovascular;  Laterality: N/A;   TEE WITHOUT CARDIOVERSION N/A 03/25/2022   Procedure: TRANSESOPHAGEAL ECHOCARDIOGRAM (TEE);  Surgeon: Laurey Morale, MD;  Location: [facility] ENDOSCOPY;  Service: Cardiovascular;  Laterality: N/A;    Current Outpatient Medications  Medication Sig Dispense Refill   dapagliflozin propanediol (FARXIGA) 10 MG TABS tablet Take 1 tablet (10 mg total) by mouth daily. 30 tablet 5   digoxin (LANOXIN) 0.125 MG tablet Take 1 tablet (0.125 mg total) by mouth daily. 30 tablet 5   dofetilide (TIKOSYN) 250 MCG capsule Take 1 capsule (250 mcg total) by mouth 2 (two) times daily. 60 capsule 5   metoprolol succinate (TOPROL-XL) 25 MG 24 hr tablet Take 1 tablet (25 mg total) by mouth daily. 30 tablet 5   OVER THE COUNTER MEDICATION Take 5 mLs by mouth in the morning. Black seed oil.     rivaroxaban (XARELTO) 20 MG TABS tablet Take 1 tablet (20 mg total) by mouth daily with supper. 30 tablet 5   sacubitril-valsartan (ENTRESTO) 24-26 MG Take 1 tablet by mouth 2 (two) times daily. 60 tablet 3   sildenafil (VIAGRA) 25 MG tablet Take 1 tablet (25 mg total) by mouth daily as needed for erectile dysfunction. 10 tablet 0   spironolactone (ALDACTONE) 25 MG tablet Take 0.5 tablets (12.5 mg total) by mouth daily. 30 tablet 3   torsemide (DEMADEX)  20 MG tablet Take 1 tablet (20 mg total) by mouth daily. 30 tablet 5   No current facility-administered medications for this visit.    Allergies:   Pacerone [amiodarone]   Social History:  The patient  reports that he has never smoked. He has never used smokeless tobacco. He reports current alcohol use. He reports that he does not use drugs.   Family History:  The patient's family history is not on file.  ROS:  Please see the history of present illness.    All other systems are reviewed and otherwise  negative.   PHYSICAL EXAM:  VS:  BP 110/72   Ht  (1.93 m)   Wt 260 lb 6.4 oz (118.1 kg)   SpO2 96%   BMI 31.70 kg/m  BMI: Body mass index is 31.7 kg/m. Well nourished, well developed, in no acute distress HEENT: normocephalic, atraumatic Neck: no JVD, carotid bruits or masses Cardiac:  RRR; no significant murmurs, no rubs, or gallops Lungs:  CTA b/l, no wheezing, rhonchi or rales Abd: soft, nontender MS: no deformity or atrophy Ext: wearing support stockings, trace-1+ edema b/l Skin: warm and dry, no rash Neuro:  No gross deficits appreciated Psych: euthymic mood, full affect   EKG:  Done today and reviewed by myself shows  SR 83bpm, PAC, QTc  05/20/22: TTE (limited) 1. Limited images   2. Left ventricular ejection fraction, by estimation, is 45 to 50%. Left  ventricular ejection fraction by 2D MOD biplane is 47.4 %. Left  ventricular ejection fraction by PLAX is 47 %. The left ventricle has  mildly decreased function. The left ventricle  demonstrates global hypokinesis. The left ventricular internal cavity size  was moderately dilated. There is moderate left ventricular hypertrophy.  Left ventricular diastolic parameters are indeterminate.   3. Right ventricular systolic function was not well visualized. The right  ventricular size is not well visualized.   4. The mitral valve is abnormal. Mild to moderate mitral valve  regurgitation.   5. The aortic valve is tricuspid. Aortic valve regurgitation is not  visualized.   6. The inferior vena cava is normal in size with greater than 50%  respiratory variability, suggesting right atrial pressure of 3 mmHg.   Comparison(s): Changes from prior study are noted. 03/26/2022: LVEF 25-30%,  severe MR.    03/26/22; TTE 1. Left ventricular ejection fraction, by estimation, is 25 to 30%. The  Left ventricle has severely decreased function. The left ventricle has no  regional wall motion abnormalities. There is moderate  asymmetric left  ventricular hypertrophy of the  lateral segment. Left ventricular diastolic function could not be  evaluated. There is the interventricular septum is flattened in systole  and diastole, consistent with right ventricular pressure and volume  overload.   2. Right ventricular systolic function is moderately reduced. The right  ventricular size is moderately enlarged.   3. Left atrial size was severely dilated.   4. Right atrial size was severely dilated.   5. The mitral valve is abnormal. Severe mitral valve regurgitation. No  evidence of mitral stenosis.   6. Tricuspid valve regurgitation is mild to moderate.   7. The aortic valve is tricuspid. Aortic valve regurgitation is trivial.  No aortic stenosis is present.   Comparison(s): Prior images unable to be directly viewed, comparison made  by report only. Changes from prior study are noted. LVEF improved from 20%  in 2019 to 30% now. Mitral regurgitation is severe now.    03/25/22:  TEE 1. Left ventricular ejection fraction, by estimation, is 20 to 25%. The  left ventricle has severely decreased function. The left ventricle  demonstrates global hypokinesis.   2. Right ventricular systolic function is moderately reduced. The right  ventricular size is mildly enlarged.   3. Left atrial size was severely dilated. No left atrial/left atrial  appendage thrombus was detected.   4. Right atrial size was severely dilated.   5. There was severe central mitral regurgitation, suspect atrial  functional MR. PISA ERO 0.44 cm^2. There was flattening but not flow  reversal of the pulmonary venous systolic doppler signal.   6. Peak RV-RA gradient 39 mmHg. Tricuspid valve regurgitation is severe.   7. The aortic valve is tricuspid. Aortic valve regurgitation is not  visualized. No aortic stenosis is present.   8. There was a PFO noted by color doppler.    01/15/2018: TTE - Left ventricle: The cavity size was severely dilated. Wall     thickness was normal. Systolic function was mildly reduced. The    estimated ejection fraction was in the range of 45% to 50%.    Diffuse hypokinesis.  - Mitral valve: Calcified annulus. There was mild regurgitation.  - Left atrium: The atrium was severely dilated.  - Right atrium: The atrium was mildly dilated.   09/12/2017: TEE - Left ventricle: The cavity size was dilated. Systolic function    was severely reduced. The estimated ejection fraction was in the    range of 15% to 20%. Diffuse hypokinesis.  - Aortic valve: No evidence of vegetation.  - Mitral valve: No evidence of vegetation. There was mild to    moderate regurgitation.  - Left atrium: The atrium was dilated. No evidence of thrombus in    the atrial cavity or appendage. No evidence of thrombus in the    appendage.  - Right ventricle: Systolic function was reduced.  - Right atrium: The atrium was dilated. No evidence of thrombus in    the atrial cavity or appendage.  - Atrial septum: No defect or patent foramen ovale was identified.    There was no right-to-left atrial level shunt, following an    increase in RA pressure induced by provocative maneuvers.  - Tricuspid valve: No evidence of vegetation.  - Pulmonic valve: No evidence of vegetation.    Recent Labs: 06/14/2022: ALT 21; B Natriuretic Peptide 104.2; TSH 0.183 06/20/2022: BUN 29; Creatinine, Ser 1.45; Hemoglobin 14.6; Magnesium 2.5; Platelets 141; Potassium 4.6; Sodium 133  No results found for requested labs within last 365 days.   Estimated Creatinine Clearance: 65.6 mL/min (A) (by C-G formula based on SCr of 1.45 mg/dL (H)).   Wt Readings from Last 3 Encounters:  06/27/22 260 lb 6.4 oz (118.1 kg)  06/20/22 250 lb (113.4 kg)  06/13/22 265 lb 12.8 oz (120.6 kg)     Other studies reviewed: Additional studies/records reviewed today include: summarized above  ASSESSMENT AND PLAN:  Persistent AFib CHA2DS2Vasc is 3, on Xarelto, appropriately  dosed Tikosyn Med list reviewed  Teaching re-enforced  Labs today  Dig level 3/27 was 03   NICM Chronic CHF (systolic) Likely tachy medicated  C/w HF team Sees them Wed Volume looks OK Weight is up from his hospital weight though wearing jeans/heavy shoes Home weights waxing/waning by his report, no steady gain   MR Mild-mod on his last echo       Described as severe on historical echos with lower EF       C/w  HF team  HTN Looks good   6. Secondary hypercoagulable state    Disposition: F/u with EP as scheduled, sooner if needed.  Current medicines are reviewed at length with the patient today.  The patient did not have any concerns regarding medicines.  Norma Fredrickson, PA-C 06/27/2022 11:11 AM     [facility] [address] [place] [address] [phone] (office)  [phone] (fax)

## 2022-06-27 ENCOUNTER — Encounter: Payer: Self-pay | Admitting: Physician Assistant

## 2022-06-27 ENCOUNTER — Ambulatory Visit: Payer: 59 | Attending: Physician Assistant | Admitting: Physician Assistant

## 2022-06-27 VITALS — BP 110/72 | Ht 76.0 in | Wt 260.4 lb

## 2022-06-27 DIAGNOSIS — D6869 Other thrombophilia: Secondary | ICD-10-CM

## 2022-06-27 DIAGNOSIS — I5022 Chronic systolic (congestive) heart failure: Secondary | ICD-10-CM

## 2022-06-27 DIAGNOSIS — I10 Essential (primary) hypertension: Secondary | ICD-10-CM

## 2022-06-27 DIAGNOSIS — I340 Nonrheumatic mitral (valve) insufficiency: Secondary | ICD-10-CM

## 2022-06-27 DIAGNOSIS — I4819 Other persistent atrial fibrillation: Secondary | ICD-10-CM | POA: Diagnosis not present

## 2022-06-27 DIAGNOSIS — I428 Other cardiomyopathies: Secondary | ICD-10-CM

## 2022-06-27 DIAGNOSIS — Z5181 Encounter for therapeutic drug level monitoring: Secondary | ICD-10-CM

## 2022-06-27 DIAGNOSIS — Z79899 Other long term (current) drug therapy: Secondary | ICD-10-CM

## 2022-06-27 NOTE — Patient Instructions (Signed)
Medication Instructions:    Your physician recommends that you continue on your current medications as directed. Please refer to the Current Medication list given to you today.  *If you need a refill on your cardiac medications before your next appointment, please call your pharmacy*   Lab Work:BMET AND MAG TODAY .   If you have labs (blood work) drawn today and your tests are completely normal, you will receive your results only by: MyChart Message (if you have MyChart) OR A paper copy in the mail If you have any lab test that is abnormal or we need to change your treatment, we will call you to review the results.   Testing/Procedures: NONE ORDERED  TODAY    Follow-Up: At [facility], you and your health needs are our priority.  As part of our continuing mission to provide you with exceptional heart care, we have created designated Provider Care Teams.  These Care Teams include your primary Cardiologist (physician) and Advanced Practice Providers (APPs -  Physician Assistants and Nurse Practitioners) who all work together to provide you with the care you need, when you need it.  We recommend signing up for the patient portal called "MyChart".  Sign up information is provided on this After Visit Summary.  MyChart is used to connect with patients for Virtual Visits (Telemedicine).  Patients are able to view lab/test results, encounter notes, upcoming appointments, etc.  Non-urgent messages can be sent to your provider as well.   To learn more about what you can do with MyChart, go to [email or web address].    Your next appointment:     Provider:   AS SCHEDULED   Loman Brooklyn, MD    Other Instructions

## 2022-06-28 ENCOUNTER — Other Ambulatory Visit: Payer: Self-pay

## 2022-06-28 ENCOUNTER — Other Ambulatory Visit (HOSPITAL_COMMUNITY): Payer: Self-pay | Admitting: Internal Medicine

## 2022-06-28 ENCOUNTER — Other Ambulatory Visit (HOSPITAL_COMMUNITY): Payer: Self-pay

## 2022-06-28 LAB — BASIC METABOLIC PANEL
BUN/Creatinine Ratio: 19 (ref 10–24)
BUN: 32 mg/dL — ABNORMAL HIGH (ref 8–27)
CO2: 24 mmol/L (ref 20–29)
Calcium: 10 mg/dL (ref 8.6–10.2)
Chloride: 99 mmol/L (ref 96–106)
Creatinine, Ser: 1.65 mg/dL — ABNORMAL HIGH (ref 0.76–1.27)
Glucose: 91 mg/dL (ref 70–99)
Potassium: 4.8 mmol/L (ref 3.5–5.2)
Sodium: 140 mmol/L (ref 134–144)
eGFR: 44 mL/min/{1.73_m2} — ABNORMAL LOW (ref >59)

## 2022-06-28 LAB — MAGNESIUM: Magnesium: 2.4 mg/dL — ABNORMAL HIGH (ref 1.6–2.3)

## 2022-06-28 MED ORDER — SILDENAFIL CITRATE 25 MG PO TABS
25.0000 mg | ORAL_TABLET | Freq: Every day | ORAL | 0 refills | Status: AC | PRN
  Filled 2022-06-28: qty 10, 10d supply, fill #0

## 2022-06-29 ENCOUNTER — Encounter (HOSPITAL_COMMUNITY): Payer: Self-pay

## 2022-06-29 ENCOUNTER — Ambulatory Visit (HOSPITAL_COMMUNITY)
Admit: 2022-06-29 | Discharge: 2022-06-29 | Disposition: A | Discharge status: Home / Self Care | Payer: 59 | Attending: Cardiology | Admitting: Cardiology

## 2022-06-29 VITALS — BP 110/70 | HR 71 | Wt 256.0 lb

## 2022-06-29 DIAGNOSIS — I4819 Other persistent atrial fibrillation: Secondary | ICD-10-CM | POA: Diagnosis not present

## 2022-06-29 DIAGNOSIS — Z79899 Other long term (current) drug therapy: Secondary | ICD-10-CM | POA: Diagnosis not present

## 2022-06-29 DIAGNOSIS — N1831 Chronic kidney disease, stage 3a: Secondary | ICD-10-CM | POA: Insufficient documentation

## 2022-06-29 DIAGNOSIS — I5022 Chronic systolic (congestive) heart failure: Secondary | ICD-10-CM | POA: Diagnosis not present

## 2022-06-29 DIAGNOSIS — I4892 Unspecified atrial flutter: Secondary | ICD-10-CM | POA: Insufficient documentation

## 2022-06-29 DIAGNOSIS — I428 Other cardiomyopathies: Secondary | ICD-10-CM | POA: Insufficient documentation

## 2022-06-29 DIAGNOSIS — Z7984 Long term (current) use of oral hypoglycemic drugs: Secondary | ICD-10-CM | POA: Insufficient documentation

## 2022-06-29 DIAGNOSIS — Z7901 Long term (current) use of anticoagulants: Secondary | ICD-10-CM | POA: Diagnosis not present

## 2022-06-29 DIAGNOSIS — I130 Hypertensive heart and chronic kidney disease with heart failure and stage 1 through stage 4 chronic kidney disease, or unspecified chronic kidney disease: Secondary | ICD-10-CM | POA: Diagnosis not present

## 2022-06-29 LAB — BASIC METABOLIC PANEL
Anion gap: 11 (ref 5–15)
BUN: 33 mg/dL — ABNORMAL HIGH (ref 8–23)
CO2: 27 mmol/L (ref 22–32)
Calcium: 9.3 mg/dL (ref 8.9–10.3)
Chloride: 97 mmol/L — ABNORMAL LOW (ref 98–111)
Creatinine, Ser: 1.76 mg/dL — ABNORMAL HIGH (ref 0.61–1.24)
GFR, Estimated: 41 mL/min — ABNORMAL LOW (ref >60)
Glucose, Bld: 86 mg/dL (ref 70–99)
Potassium: 4.4 mmol/L (ref 3.5–5.1)
Sodium: 135 mmol/L (ref 135–145)

## 2022-06-29 LAB — BRAIN NATRIURETIC PEPTIDE: B Natriuretic Peptide: 43.5 pg/mL (ref 0.0–100.0)

## 2022-06-29 LAB — DIGOXIN LEVEL: Digoxin Level: 0.6 ng/mL — ABNORMAL LOW (ref 0.8–2.0)

## 2022-06-29 NOTE — Progress Notes (Addendum)
Advanced [facility] Failure Clinic Note   Referring Physician: PCP: Renaye Rakers, MD PCP-Cardiologist: Arvilla Meres, MD   HPI:  [name] is a 72 y.o. male with persistent afib (s/p ablation 2012, DCCV 09/12/17), HTN, and chronic systolic HF due to NICM.    Underwent DC-CV of AF on 09/12/17.    Admitted 6/27-09/18/17 from afib clinic with volume overload and AKI. Echo EF 15-20% HF team consulted. Echo showed EF 15-20%.   In 05/2018, volume up in setting of AFL with RVR. Taken to ED for diuresis and DCCV, converted to NSR. Unfortunately, lost to follow up.   Seen in HF clinic 03/24/22, and was found to be in A flutter RVR and volume overloaded. Admitted. Underwent TEE/DCCV. TEE showed EF 20-25% with severe central MR, DCCV successful to NSR. Became hypotensive post DCCV, and placed on NE. Amio gtt started. TTE EF 25-30%, RV moderately down, severe MR. Drips eventually weaned off, unable to tolerate amio 2/2 itching. EP planning to discuss ablation outpatient. GDMT titrated and he was discharged home, weight 243 lbs.      Echo 05/20/22: EF 45-50% mild MR  Seen by Dr. Elberta Fortis in EP clinic. Ablation offered (vs Tikosyn). Pt opted on Tikosyn and potential ablation in the future.    Seen in [facility] 3/24 prior to Tikosyn load and was noted to be volume overloaded. He was admitted for IV diuretics and HF optimization prior to Tikosyn loading. He diuresed w/ IV Lasix and electrolytes closely monitored. Once euvolemic, he was loaded w/ Tikosyn and tolerated ok. Discharged on 250 mcg bid. Transitioned back to PO torsemide and other GDMT. Discharged home on 4/1. D/c wt was 249 lb.   He had f/u in EP clinic earlier this week on 3/8. EKG showed NSR. QTc was ok. K was 4.8. Mg 2.4.   He returns to [facility] for f/u. Reports doing well. Checking wt daily at home and home wts have stayed between 250-252 lb. Breathing improved. NYHA Class II. No resting dyspnea. Denies orthopnea/PND. No LEE. Denies breakthrough  palpitations. EKG today shows NSR 68 bpm. QT/QTc 436/463 ms. Fully compliant w/ Xarelto. No abnormal bleeding. BP 110/70.  He has home sleep study kit but has not yet completed test. He plans to set this up tonight.     Review of Systems: [y] = yes, [ ]  = no   General: Weight gain [ ] ; Weight loss [ ] ; Anorexia [ ] ; Fatigue [ ] ; Fever [ ] ; Chills [ ] ; Weakness [ ]   Cardiac: Chest pain/pressure [ ] ; Resting SOB [ ] ; Exertional SOB [ ] ; Orthopnea [ ] ; Pedal Edema [ ] ; Palpitations [ ] ; Syncope [ ] ; Presyncope [ ] ; Paroxysmal nocturnal dyspnea[ ]   Pulmonary: Cough [ ] ; Wheezing[ ] ; Hemoptysis[ ] ; Sputum [ ] ; Snoring [ Y]  GI: Vomiting[ ] ; Dysphagia[ ] ; Melena[ ] ; Hematochezia [ ] ; Heartburn[ ] ; Abdominal pain [ ] ; Constipation [ ] ; Diarrhea [ ] ; BRBPR [ ]   GU: Hematuria[ ] ; Dysuria [ ] ; Nocturia[ ]   Vascular: Pain in legs with walking [ ] ; Pain in feet with lying flat [ ] ; Non-healing sores [ ] ; Stroke [ ] ; TIA [ ] ; Slurred speech [ ] ;  Neuro: Headaches[ ] ; Vertigo[ ] ; Seizures[ ] ; Paresthesias[ ] ;Blurred vision [ ] ; Diplopia [ ] ; Vision changes [ ]   Ortho/Skin: Arthritis [ ] ; Joint pain [ ] ; Muscle pain [ ] ; Joint swelling [ ] ; Back Pain [ ] ; Rash [ ]   Psych: Depression[ ] ; Anxiety[ ]   Heme: Bleeding problems [ ] ;  Clotting disorders [ ] ; Anemia [ ]   Endocrine: Diabetes [ ] ; Thyroid dysfunction[ ]    Past Medical History:  Diagnosis Date   Atrial tachycardia    Chronic HFrEF (heart failure with reduced ejection fraction)    Hypertension    Mitral regurgitation    NICM (nonischemic cardiomyopathy)    a. 2009 EF 25%; b. 05/2010 Cath: nl cors. EF 45%; c. 12/2017 Echo: EF 45-50%, diff HK, mild MR, sev dil LA.   Paroxysmal atrial flutter    a. Dx in 05/2018.   Persistent atrial fibrillation    a. 2012 s/p RFCA; b. 08/2017 s/p DCCV.    Current Outpatient Medications  Medication Sig Dispense Refill   dapagliflozin propanediol (FARXIGA) 10 MG TABS tablet Take 1 tablet (10 mg total) by mouth  daily. 30 tablet 5   digoxin (LANOXIN) 0.125 MG tablet Take 1 tablet (0.125 mg total) by mouth daily. 30 tablet 5   dofetilide (TIKOSYN) 250 MCG capsule Take 1 capsule (250 mcg total) by mouth 2 (two) times daily. 60 capsule 5   metoprolol succinate (TOPROL-XL) 25 MG 24 hr tablet Take 1 tablet (25 mg total) by mouth daily. 30 tablet 5   OVER THE COUNTER MEDICATION Take 5 mLs by mouth in the morning. Black seed oil.     rivaroxaban (XARELTO) 20 MG TABS tablet Take 1 tablet (20 mg total) by mouth daily with supper. 30 tablet 5   sacubitril-valsartan (ENTRESTO) 24-26 MG Take 1 tablet by mouth 2 (two) times daily. 60 tablet 3   sildenafil (VIAGRA) 25 MG tablet Take 1 tablet (25 mg total) by mouth daily as needed for erectile dysfunction. 10 tablet 0   spironolactone (ALDACTONE) 25 MG tablet Take 0.5 tablets (12.5 mg total) by mouth daily. 30 tablet 3   torsemide (DEMADEX) 20 MG tablet Take 1 tablet (20 mg total) by mouth daily. 30 tablet 5   No current facility-administered medications for this encounter.    Allergies  Allergen Reactions   Pacerone [Amiodarone] Itching and Rash      Social History   Socioeconomic History   Marital status: Widowed    Spouse name: Not on file   Number of children: Not on file   Years of education: Not on file   Highest education level: Not on file  Occupational History   Not on file  Tobacco Use   Smoking status: Never   Smokeless tobacco: Never  Vaping Use   Vaping Use: Never used  Substance and Sexual Activity   Alcohol use: Yes    Comment: rare   Drug use: Never   Sexual activity: Not on file  Other Topics Concern   Not on file  Social History Narrative   Not on file   Social Determinants of Health   Financial Resource Strain: Low Risk  (06/16/2022)   Overall Financial Resource Strain (CARDIA)    Difficulty of Paying Living Expenses: Not hard at all  Food Insecurity: No Food Insecurity (06/14/2022)   Hunger Vital Sign    Worried About  Running Out of Food in the Last Year: Never true    Ran Out of Food in the Last Year: Never true  Transportation Needs: No Transportation Needs (06/14/2022)   PRAPARE - Administrator, Civil Service (Medical): No    Lack of Transportation (Non-Medical): No  Physical Activity: Not on file  Stress: Not on file  Social Connections: Not on file  Intimate Partner Violence: Not At Risk (06/14/2022)  Humiliation, Afraid, Rape, and Kick questionnaire    Fear of Current or Ex-Partner: No    Emotionally Abused: No    Physically Abused: No    Sexually Abused: No     History reviewed. No pertinent family history.  Vitals:   06/29/22 1328  BP: 110/70  Pulse: 71  SpO2: 97%  Weight: 116.1 kg (256 lb)     PHYSICAL EXAM: General:  Well appearing. No respiratory difficulty HEENT: normal Neck: supple. no JVD. Carotids 2+ bilat; no bruits. No lymphadenopathy or thyromegaly appreciated. Cor: PMI nondisplaced. Regular rate & rhythm. No rubs, gallops or murmurs. Lungs: clear Abdomen: soft, nontender, nondistended. No hepatosplenomegaly. No bruits or masses. Good bowel sounds. Extremities: no cyanosis, clubbing, rash, edema Neuro: alert & oriented x 3, cranial nerves grossly intact. moves all 4 extremities w/o difficulty. Affect pleasant.  ECG: NSR 68 bpm, QT/QTc 436/463 ms    ASSESSMENT & PLAN:  1. Chronic systolic heart failure, NICM - Echo (1/24): EF 25-30%, moderate asymmetric LVH, RV moderately reduced, severe MR, mild to moderate TR - LHC (2012): normal coronaries. No ICD.  - Echo 05/20/22: EF 45-50% mild MR - NYHA Class II. Euvolemic on exam  - Continue 20 Torsemide daily - Continue Toprol XL 25 mg daily  - Continue digoxin 0.125 mg daily. Check Dig level  - Continue Spiro 25 mg daily - Continue Entresto 24-26 mg BID. BP too soft for titration  - Continue Farxiga 10 mg daily - Check BMP today    Paroxsysmal Atrial Flutter  - Had Afib ablation 2012 and DC-CV 2019, 2020  and 2024 - intolerant of amio (itching) - Saw Dr. Elberta Fortisamnitz 2/24. Ablation offered but he decided to defer  - 3/28 Started Tikosyn. Converted to NSR. Tolerated load - Continue Tikosyn 250 mcg bid. Maintaining NSR. QT/QTc ok on today's EKG. K and Mg checked in EP clinic on 4/8 and stable, K  4.8. Mg 2.4. - Continue digoxin and Toprol XL.   - Continue Xarelto daily.  - Possible ablation down the road - Will also need to complete home sleep study. He has the kit at home and plans to complete this tonight   Severe MR -  MR is mild to moderate on echo 05/20/22    HTN - controlled on current regimen - continue GDMT per above - check BMP    5. CKD Stage IIIa - Baseline creatinine ~ 1.5 - on Farxiga  - Check BMP   F/u w/ APP in 2 months   Robbie LisBrittainy Jaedyn Marrufo, PA-C 06/29/22

## 2022-06-29 NOTE — Addendum Note (Signed)
Encounter addended by: Allayne Butcher, PA-C on: 06/29/2022 1:40 PM  Actions taken: Clinical Note Signed

## 2022-06-29 NOTE — Addendum Note (Signed)
Encounter addended by: Linda Hedges, RN on: 06/29/2022 1:58 PM  Actions taken: Order list changed, Diagnosis association updated, Clinical Note Signed, Charge Capture section accepted

## 2022-06-29 NOTE — Patient Instructions (Signed)
There has been no changes to your medications.  Labs done today, your results will be available in MyChart, we will contact you for abnormal readings.  Your physician recommends that you schedule a follow-up appointment in: 2 months  If you have any questions or concerns before your next appointment please send us a message through mychart or call our office at [phone].    TO LEAVE A MESSAGE FOR THE NURSE SELECT OPTION 2, PLEASE LEAVE A MESSAGE INCLUDING: YOUR NAME DATE OF BIRTH CALL BACK NUMBER REASON FOR CALL**this is important as we prioritize the call backs  YOU WILL RECEIVE A CALL BACK THE SAME DAY AS LONG AS YOU CALL BEFORE 4:00 PM  At the [facility], you and your health needs are our priority. As part of our continuing mission to provide you with exceptional heart care, we have created designated Provider Care Teams. These Care Teams include your primary Cardiologist (physician) and Advanced Practice Providers (APPs- Physician Assistants and Nurse Practitioners) who all work together to provide you with the care you need, when you need it.   You may see any of the following providers on your designated Care Team at your next follow up: Dr Daniel Bensimhon Dr Dalton McLean Dr. Aditya Sabharwal Amy Clegg, NP Brittainy Simmons, PA Jessica Milford,NP Lindsay Finch, PA Alma Diaz, NP Lauren Kemp, PharmD   Please be sure to bring in all your medications bottles to every appointment.    Thank you for choosing  [facility]    

## 2022-06-29 NOTE — Addendum Note (Signed)
Encounter addended by: Allayne Butcher, PA-C on: 06/29/2022 3:46 PM  Actions taken: Clinical Note Signed, Problem List reviewed, Medication List reviewed, Allergies reviewed, Flowsheet accepted, Level of Service modified

## 2022-07-01 ENCOUNTER — Telehealth (HOSPITAL_COMMUNITY): Payer: Self-pay

## 2022-07-01 NOTE — Telephone Encounter (Signed)
Chip Boer with [employer] called to report she needs to know hw long patient will have restrictions before he can return to work  -update letter or return call to [phone]

## 2022-07-01 NOTE — Telephone Encounter (Signed)
Pt returned call to state HR/company requests a email copy of forms and fax. Information below.  [email or web address] Fax num [phone]

## 2022-07-01 NOTE — Telephone Encounter (Signed)
RTW paper work signed and Warden/ranger to company. Patient aware

## 2022-07-03 ENCOUNTER — Other Ambulatory Visit (HOSPITAL_BASED_OUTPATIENT_CLINIC_OR_DEPARTMENT_OTHER): Payer: Self-pay

## 2022-07-04 ENCOUNTER — Other Ambulatory Visit (HOSPITAL_COMMUNITY): Payer: Self-pay

## 2022-07-04 ENCOUNTER — Encounter (HOSPITAL_COMMUNITY): Payer: 59

## 2022-07-04 MED ORDER — SPIRONOLACTONE 25 MG PO TABS
12.5000 mg | ORAL_TABLET | Freq: Every day | ORAL | 3 refills | Status: DC
  Filled 2022-07-04: qty 30, 60d supply, fill #0

## 2022-07-04 MED ORDER — SPIRONOLACTONE 25 MG PO TABS: 25.0000 mg | ORAL_TABLET | Freq: Every day | ORAL | 3 refills | Status: DC

## 2022-07-04 NOTE — Telephone Encounter (Signed)
Have faxed the RTW to [phone] 4 times and e-mailed it to [email or web address] 2 times. Each time I have received confirmation that it transmitted successfully

## 2022-07-04 NOTE — Telephone Encounter (Signed)
Patient called very upset about form. He states it has to list the date he can go back without restrictions or he cannot go back to work. Please review and follow up.

## 2022-07-04 NOTE — Telephone Encounter (Signed)
Kaiven called back and stated company just told him they do not have paperwork. Can we re-fax and email it. He would like to pick up a copy today if we can as well.

## 2022-07-13 ENCOUNTER — Other Ambulatory Visit (HOSPITAL_COMMUNITY): Payer: Self-pay | Admitting: Internal Medicine

## 2022-07-15 ENCOUNTER — Other Ambulatory Visit (HOSPITAL_COMMUNITY): Payer: Self-pay

## 2022-07-15 MED ORDER — ENTRESTO 24-26 MG PO TABS: 1.0000 | ORAL_TABLET | Freq: Two times a day (BID) | ORAL | 11 refills | Status: AC

## 2022-07-18 ENCOUNTER — Other Ambulatory Visit: Payer: Self-pay

## 2022-07-18 ENCOUNTER — Other Ambulatory Visit (HOSPITAL_COMMUNITY): Payer: Self-pay

## 2022-07-19 ENCOUNTER — Other Ambulatory Visit: Payer: Self-pay

## 2022-07-19 ENCOUNTER — Other Ambulatory Visit (HOSPITAL_COMMUNITY): Payer: Self-pay

## 2022-07-20 NOTE — Telephone Encounter (Signed)
Note entered in error

## 2022-07-26 ENCOUNTER — Other Ambulatory Visit (HOSPITAL_COMMUNITY): Payer: Self-pay

## 2022-07-27 ENCOUNTER — Other Ambulatory Visit: Payer: Self-pay

## 2022-07-29 ENCOUNTER — Encounter: Payer: Self-pay | Admitting: Cardiology

## 2022-07-29 ENCOUNTER — Ambulatory Visit: Payer: 59 | Attending: Cardiology | Admitting: Cardiology

## 2022-07-29 VITALS — BP 110/84 | HR 88 | Ht 76.0 in | Wt 259.0 lb

## 2022-07-29 DIAGNOSIS — Z79899 Other long term (current) drug therapy: Secondary | ICD-10-CM

## 2022-07-29 DIAGNOSIS — I4819 Other persistent atrial fibrillation: Secondary | ICD-10-CM | POA: Diagnosis not present

## 2022-07-29 DIAGNOSIS — D6869 Other thrombophilia: Secondary | ICD-10-CM | POA: Diagnosis not present

## 2022-07-29 DIAGNOSIS — I5022 Chronic systolic (congestive) heart failure: Secondary | ICD-10-CM | POA: Diagnosis not present

## 2022-07-29 NOTE — Progress Notes (Signed)
Electrophysiology Office Note   Date:  07/29/2022   ID:  BOURNE DRABIK, DOB May 29, 1950, MRN [id number]  PCP:  Renaye Rakers, MD  Cardiologist:  Bensimhon Primary Electrophysiologist:  Lorence Nagengast Jorja Loa, MD    Chief Complaint: AF   History of Present Illness: [name] is a 72 y.o. male who is being seen today for the evaluation of AF at the request of Renaye Rakers, MD. Presenting today for electrophysiology evaluation.  He has a history sniffer atrial fibrillation, hypertension, chronic systolic heart failure.  He was hospitalized January 2024 with atrial flutter and heart failure exacerbation.  He had a longstanding history of atrial fibrillation post convergent ablation in 2012.  He was loaded on dofetilide in 2013 but this was stopped.  He presented to heart failure clinic volume overloaded.  He was admitted to the hospital for diuresis and received a dofetilide load at the same time.  Today, denies symptoms of palpitations, chest pain, shortness of breath, orthopnea, PND, lower extremity edema, claudication, dizziness, presyncope, syncope, bleeding, or neurologic sequela. The patient is tolerating medications without difficulties.  He feels well.  He has no chest pain or shortness of breath.  He has done well since his admission to the hospital.  Repeat echo shows an improved ejection fraction.    Past Medical History:  Diagnosis Date   Atrial tachycardia    Chronic HFrEF (heart failure with reduced ejection fraction) (HCC)    Hypertension    Mitral regurgitation    NICM (nonischemic cardiomyopathy) (HCC)    a. 2009 EF 25%; b. 05/2010 Cath: nl cors. EF 45%; c. 12/2017 Echo: EF 45-50%, diff HK, mild MR, sev dil LA.   Paroxysmal atrial flutter (HCC)    a. Dx in 05/2018.   Persistent atrial fibrillation (HCC)    a. 2012 s/p RFCA; b. 08/2017 s/p DCCV.   Past Surgical History:  Procedure Laterality Date   CARDIAC ELECTROPHYSIOLOGY STUDY AND ABLATION  2012   Dr.  Riccardo Dubin   CARDIOVERSION N/A 09/12/2017   Procedure: CARDIOVERSION;  Surgeon: Jake Bathe, MD;  Location: [facility] ENDOSCOPY;  Service: Cardiovascular;  Laterality: N/A;   CARDIOVERSION N/A 03/25/2022   Procedure: CARDIOVERSION;  Surgeon: Laurey Morale, MD;  Location: [facility] ENDOSCOPY;  Service: Cardiovascular;  Laterality: N/A;   LOOP RECORDER IMPLANT  06/11/2010   TEE WITHOUT CARDIOVERSION N/A 09/12/2017   Procedure: TRANSESOPHAGEAL ECHOCARDIOGRAM (TEE);  Surgeon: Jake Bathe, MD;  Location[facility] ENDOSCOPY;  Service: Cardiovascular;  Laterality: N/A;   TEE WITHOUT CARDIOVERSION N/A 03/25/2022   Procedure: TRANSESOPHAGEAL ECHOCARDIOGRAM (TEE);  Surgeon: Laurey Morale, MD;  Location: [facility] ENDOSCOPY;  Service: Cardiovascular;  Laterality: N/A;     Current Outpatient Medications  Medication Sig Dispense Refill   dapagliflozin propanediol (FARXIGA) 10 MG TABS tablet Take 1 tablet (10 mg total) by mouth daily. 30 tablet 5   digoxin (LANOXIN) 0.125 MG tablet Take 1 tablet (0.125 mg total) by mouth daily. 30 tablet 5   dofetilide (TIKOSYN) 250 MCG capsule Take 1 capsule (250 mcg total) by mouth 2 (two) times daily. 60 capsule 5   metoprolol succinate (TOPROL-XL) 25 MG 24 hr tablet Take 1 tablet (25 mg total) by mouth daily. 30 tablet 5   OVER THE COUNTER MEDICATION Take 5 mLs by mouth in the morning. Black seed oil.     rivaroxaban (XARELTO) 20 MG TABS tablet Take 1 tablet (20 mg total) by mouth daily with supper. 30 tablet 5   sacubitril-valsartan (ENTRESTO) 24-26 MG  Take 1 tablet by mouth 2 (two) times daily. 60 tablet 11   sildenafil (VIAGRA) 25 MG tablet Take 1 tablet (25 mg total) by mouth daily as needed for erectile dysfunction. 10 tablet 0   spironolactone (ALDACTONE) 25 MG tablet Take 1 tablet (25 mg total) by mouth daily. 30 tablet 3   torsemide (DEMADEX) 20 MG tablet Take 1 tablet (20 mg total) by mouth daily. 30 tablet 5   No current facility-administered medications for this visit.     Allergies:   Pacerone [amiodarone]   Social History:  The patient  reports that he has never smoked. He has never used smokeless tobacco. He reports current alcohol use. He reports that he does not use drugs.   Family History:  The patient's family history is not on file.   ROS:  Please see the history of present illness.   Otherwise, review of systems is positive for none.   All other systems are reviewed and negative.   PHYSICAL EXAM: VS:  BP 110/84   Pulse 88   Ht 6\' 4"  (1.93 m)   Wt 259 lb (117.5 kg)   SpO2 93%   BMI 31.53 kg/m  , BMI Body mass index is 31.53 kg/m. GEN: Well nourished, well developed, in no acute distress  HEENT: normal  Neck: no JVD, carotid bruits, or masses Cardiac: RRR; no murmurs, rubs, or gallops,no edema  Respiratory:  clear to auscultation bilaterally, normal work of breathing GI: soft, nontender, nondistended, + BS MS: no deformity or atrophy  Skin: warm and dry Neuro:  Strength and sensation are intact Psych: euthymic mood, full affect  EKG:  EKG is not ordered today. Personal review of the ekg ordered 06/29/22 shows sinus rhythm    Recent Labs: 06/14/2022: ALT 21; TSH 0.183 06/20/2022: Hemoglobin 14.6; Platelets 141 06/27/2022: Magnesium 2.4 06/29/2022: B Natriuretic Peptide 43.5; BUN 33; Creatinine, Ser 1.76; Potassium 4.4; Sodium 135    Lipid Panel     Component Value Date/Time   CHOL  09/16/2008 0417    138        ATP III CLASSIFICATION:  <200     mg/dL   Desirable  161-096  mg/dL   Borderline High  >=045    mg/dL   High          TRIG 36 09/16/2008 0417   HDL 27 (L) 09/16/2008 0417   CHOLHDL 5.1 09/16/2008 0417   VLDL 7 09/16/2008 0417   LDLCALC (H) 09/16/2008 0417    104        Total Cholesterol/HDL:CHD Risk Coronary Heart Disease Risk Table                     Men   Women  1/2 Average Risk   3.4   3.3  Average Risk       5.0   4.4  2 X Average Risk   9.6   7.1  3 X Average Risk  23.4   11.0        Use the calculated  Patient Ratio above and the CHD Risk Table to determine the patient's CHD Risk.        ATP III CLASSIFICATION (LDL):  <100     mg/dL   Optimal  409-811  mg/dL   Near or Above                    Optimal  130-159  mg/dL   Borderline  914-782  mg/dL  High  >190     mg/dL   Very High     Wt Readings from Last 3 Encounters:  07/29/22 259 lb (117.5 kg)  06/29/22 256 lb (116.1 kg)  06/27/22 260 lb 6.4 oz (118.1 kg)      Other studies Reviewed: Additional studies/ records that were reviewed today include: TTE 05/20/22  Review of the above records today demonstrates:   1. Limited images   2. Left ventricular ejection fraction, by estimation, is 45 to 50%. Left  ventricular ejection fraction by 2D MOD biplane is 47.4 %. Left  ventricular ejection fraction by PLAX is 47 %. The left ventricle has  mildly decreased function. The left ventricle  demonstrates global hypokinesis. The left ventricular internal cavity size  was moderately dilated. There is moderate left ventricular hypertrophy.  Left ventricular diastolic parameters are indeterminate.   3. Right ventricular systolic function was not well visualized. The right  ventricular size is not well visualized.   4. The mitral valve is abnormal. Mild to moderate mitral valve  regurgitation.   5. The aortic valve is tricuspid. Aortic valve regurgitation is not  visualized.   6. The inferior vena cava is normal in size with greater than 50%  respiratory variability, suggesting right atrial pressure of 3 mmHg.    ASSESSMENT AND PLAN:  1.  Persistent atrial fibrillation/atypical atrial flutter: Status post convergent ablation in 2011.  CHA2DS2-VASc of 3.  Currently on Xarelto and Tikosyn.  Remains in sinus rhythm.  2.  Secondary hypercoagulable state: Currently on Xarelto for atrial fibrillation  3.  High risk medication monitoring: Currently on Tikosyn for atrial fibrillation.  QTc remains stable.  4.  Chronic systolic heart  failure: Followed by heart failure clinic.  Currently on optimal medical therapy.  Ejection fraction has improved.   Current medicines are reviewed at length with the patient today.   The patient does not have concerns regarding his medicines.  The following changes were made today: None  Labs/ tests ordered today include:  No orders of the defined types were placed in this encounter.    Disposition:   FU 6 months  Signed, Audryana Hockenberry Jorja Loa, MD  07/29/2022 2:58 PM     [facility] [address] [place] [address] [phone] (office) [phone] (fax)

## 2022-07-29 NOTE — Patient Instructions (Signed)
Medication Instructions:  Your physician recommends that you continue on your current medications as directed. Please refer to the Current Medication list given to you today.  *If you need a refill on your cardiac medications before your next appointment, please call your pharmacy*   Lab Work: None ordered   Testing/Procedures: None ordered   Follow-Up: At [facility], you and your health needs are our priority.  As part of our continuing mission to provide you with exceptional heart care, we have created designated Provider Care Teams.  These Care Teams include your primary Cardiologist (physician) and Advanced Practice Providers (APPs -  Physician Assistants and Nurse Practitioners) who all work together to provide you with the care you need, when you need it.  Your next appointment:   6 month(s)  The format for your next appointment:   In Person  Provider:   You will see one of the following Advanced Practice Providers on your designated Care Team:   Francis Dowse, [place] Casimiro Needle "Mardelle Matte" Lanna Poche, [place]  Thank you for choosing [facility]!!   Dory Horn, RN [phone]  Other Instructions  You do NOT have any work restrictions from an afib standpoint.  Please discuss work restrictions further with the heart failure clinic.

## 2022-08-04 ENCOUNTER — Telehealth (HOSPITAL_COMMUNITY): Payer: Self-pay | Admitting: Cardiology

## 2022-08-04 NOTE — Telephone Encounter (Signed)
Patient called to check the status of forms left for Dr Gala Romney on 08/01/22. Pt was assured provider would call back on 08/01/22 with update.  Forms are regarding RTW/job duties -lost current job due to restrictions and only has 30 days to complete this current process before he is completely fired.  Forms are not available in providers correspondence  Message to [facility] team for possible updates

## 2022-08-05 NOTE — Telephone Encounter (Signed)
Discussed w/Dr Bensimhon, ok for pt to RTW, no lifting/carrying/pushing/pulling >50 lbs.  Form completed, signed by Dr Gala Romney, and faxed into [employer] at [phone]  Pt aware, copy at front desk for him to pick up

## 2022-08-16 ENCOUNTER — Other Ambulatory Visit (HOSPITAL_COMMUNITY): Payer: Self-pay

## 2022-08-16 ENCOUNTER — Other Ambulatory Visit: Payer: Self-pay

## 2022-08-29 ENCOUNTER — Ambulatory Visit (HOSPITAL_COMMUNITY)
Admission: RE | Admit: 2022-08-29 | Discharge: 2022-08-29 | Disposition: A | Discharge status: Home / Self Care | Payer: 59 | Source: Ambulatory Visit | Attending: Family Medicine | Admitting: Family Medicine

## 2022-08-29 ENCOUNTER — Encounter (HOSPITAL_COMMUNITY): Payer: Self-pay

## 2022-08-29 VITALS — BP 114/62 | HR 69 | Wt 272.2 lb

## 2022-08-29 DIAGNOSIS — N1831 Chronic kidney disease, stage 3a: Secondary | ICD-10-CM | POA: Insufficient documentation

## 2022-08-29 DIAGNOSIS — Z79899 Other long term (current) drug therapy: Secondary | ICD-10-CM | POA: Insufficient documentation

## 2022-08-29 DIAGNOSIS — I959 Hypotension, unspecified: Secondary | ICD-10-CM | POA: Insufficient documentation

## 2022-08-29 DIAGNOSIS — I4892 Unspecified atrial flutter: Secondary | ICD-10-CM | POA: Insufficient documentation

## 2022-08-29 DIAGNOSIS — I10 Essential (primary) hypertension: Secondary | ICD-10-CM

## 2022-08-29 DIAGNOSIS — I340 Nonrheumatic mitral (valve) insufficiency: Secondary | ICD-10-CM | POA: Diagnosis not present

## 2022-08-29 DIAGNOSIS — I5022 Chronic systolic (congestive) heart failure: Secondary | ICD-10-CM | POA: Insufficient documentation

## 2022-08-29 DIAGNOSIS — I428 Other cardiomyopathies: Secondary | ICD-10-CM | POA: Insufficient documentation

## 2022-08-29 DIAGNOSIS — Z7901 Long term (current) use of anticoagulants: Secondary | ICD-10-CM | POA: Insufficient documentation

## 2022-08-29 DIAGNOSIS — I4819 Other persistent atrial fibrillation: Secondary | ICD-10-CM | POA: Diagnosis not present

## 2022-08-29 DIAGNOSIS — N183 Chronic kidney disease, stage 3 unspecified: Secondary | ICD-10-CM

## 2022-08-29 DIAGNOSIS — I130 Hypertensive heart and chronic kidney disease with heart failure and stage 1 through stage 4 chronic kidney disease, or unspecified chronic kidney disease: Secondary | ICD-10-CM | POA: Insufficient documentation

## 2022-08-29 DIAGNOSIS — I4891 Unspecified atrial fibrillation: Secondary | ICD-10-CM | POA: Diagnosis not present

## 2022-08-29 LAB — BASIC METABOLIC PANEL
Anion gap: 9 (ref 5–15)
BUN: 15 mg/dL (ref 8–23)
CO2: 28 mmol/L (ref 22–32)
Calcium: 9.7 mg/dL (ref 8.9–10.3)
Chloride: 100 mmol/L (ref 98–111)
Creatinine, Ser: 1.52 mg/dL — ABNORMAL HIGH (ref 0.61–1.24)
GFR, Estimated: 49 mL/min — ABNORMAL LOW (ref >60)
Glucose, Bld: 107 mg/dL — ABNORMAL HIGH (ref 70–99)
Potassium: 4.1 mmol/L (ref 3.5–5.1)
Sodium: 137 mmol/L (ref 135–145)

## 2022-08-29 LAB — BRAIN NATRIURETIC PEPTIDE: B Natriuretic Peptide: 124.8 pg/mL — ABNORMAL HIGH (ref 0.0–100.0)

## 2022-08-29 MED ORDER — POTASSIUM CHLORIDE CRYS ER 20 MEQ PO TBCR: 20.0000 meq | EXTENDED_RELEASE_TABLET | Freq: Every day | ORAL | 6 refills | Status: DC

## 2022-08-29 MED ORDER — TORSEMIDE 20 MG PO TABS: 40.0000 mg | ORAL_TABLET | Freq: Every day | ORAL | 6 refills | Status: DC

## 2022-08-29 NOTE — Progress Notes (Signed)
Advanced [facility] Failure Clinic Note   PCP: Renaye Rakers, MD HF Cardiologist: Arvilla Meres, MD   HPI: [name] is a 72 y.o. male with persistent afib (s/p ablation 2012, DCCV 09/12/17), HTN, and chronic systolic HF due to NICM.    Underwent DC-CV of AF on 09/12/17.    Admitted 6/27-09/18/17 from afib clinic with volume overload and AKI. Echo EF 15-20% HF team consulted. Echo showed EF 15-20%.   In 05/2018, volume up in setting of AFL with RVR. Taken to ED for diuresis and DCCV, converted to NSR. Unfortunately, lost to follow up.   Seen in HF clinic 03/24/22, and was found to be in A flutter RVR and volume overloaded. Admitted. Underwent TEE/DCCV. TEE showed EF 20-25% with severe central MR, DCCV successful to NSR. Became hypotensive post DCCV, and placed on NE. Amio gtt started. TTE EF 25-30%, RV moderately down, severe MR. Drips eventually weaned off, unable to tolerate amio 2/2 itching. EP planning to discuss ablation outpatient. GDMT titrated and he was discharged home, weight 243 lbs.    Echo (05/20/22): EF 45-50% mild MR  Seen by Dr. Elberta Fortis in EP clinic. Ablation offered (vs Tikosyn). Pt opted on Tikosyn and potential ablation in the future.    Seen in [facility] 3/24 prior to Tikosyn load and was noted to be volume overloaded. He was admitted for IV diuresis and HF optimization prior to Tikosyn loading. Once euvolemic, he was loaded w/ Tikosyn and tolerated ok. Discharged on 250 mcg bid. Transitioned back to PO torsemide and other GDMT. Discharged home, weight 249 lbs.   Post hospital follow up with EP, EKG showed NSR. QTc was ok. K was 4.8. Mag 2.4.   Today he returns for HF follow up. Overall feeling fine. He is not SOB walking or pushing grocery cart. He is back at work (works in [employer], lifting heavy boxes). Denies palpitations, abnormal bleeding, CP, dizziness, edema, or PND/Orthopnea. Appetite ok. No fever or chills. Weight at home 259 pounds, but not weighing regularly. Taking  all medications.   Cardiac Studies - Echo (3/24): EF 45-50%, mild MR  - Echo (1/24): 25-30%, RV moderately down, severe MR  - TEE (1/24): EF 20-25% with severe central MR  - Echo (2019): EF 15-50%  Past Medical History:  Diagnosis Date   Atrial tachycardia    Chronic HFrEF (heart failure with reduced ejection fraction) (HCC)    Hypertension    Mitral regurgitation    NICM (nonischemic cardiomyopathy) (HCC)    a. 2009 EF 25%; b. 05/2010 Cath: nl cors. EF 45%; c. 12/2017 Echo: EF 45-50%, diff HK, mild MR, sev dil LA.   Paroxysmal atrial flutter (HCC)    a. Dx in 05/2018.   Persistent atrial fibrillation (HCC)    a. 2012 s/p RFCA; b. 08/2017 s/p DCCV.   Current Outpatient Medications  Medication Sig Dispense Refill   dapagliflozin propanediol (FARXIGA) 10 MG TABS tablet Take 1 tablet (10 mg total) by mouth daily. 30 tablet 5   digoxin (LANOXIN) 0.125 MG tablet Take 1 tablet (0.125 mg total) by mouth daily. 30 tablet 5   dofetilide (TIKOSYN) 250 MCG capsule Take 1 capsule (250 mcg total) by mouth 2 (two) times daily. 60 capsule 5   metoprolol succinate (TOPROL-XL) 25 MG 24 hr tablet Take 1 tablet (25 mg total) by mouth daily. 30 tablet 5   OVER THE COUNTER MEDICATION Take 5 mLs by mouth in the morning. Black seed oil.     rivaroxaban (  XARELTO) 20 MG TABS tablet Take 1 tablet (20 mg total) by mouth daily with supper. 30 tablet 5   sacubitril-valsartan (ENTRESTO) 24-26 MG Take 1 tablet by mouth 2 (two) times daily. 60 tablet 11   sildenafil (VIAGRA) 25 MG tablet Take 1 tablet (25 mg total) by mouth daily as needed for erectile dysfunction. 10 tablet 0   spironolactone (ALDACTONE) 25 MG tablet Take 1 tablet (25 mg total) by mouth daily. 30 tablet 3   torsemide (DEMADEX) 20 MG tablet Take 1 tablet (20 mg total) by mouth daily. 30 tablet 5   No current facility-administered medications for this encounter.   Allergies  Allergen Reactions   Pacerone [Amiodarone] Itching and Rash   Social  History   Socioeconomic History   Marital status: Widowed    Spouse name: Not on file   Number of children: Not on file   Years of education: Not on file   Highest education level: Not on file  Occupational History   Not on file  Tobacco Use   Smoking status: Never   Smokeless tobacco: Never  Vaping Use   Vaping Use: Never used  Substance and Sexual Activity   Alcohol use: Yes    Comment: rare   Drug use: Never   Sexual activity: Not on file  Other Topics Concern   Not on file  Social History Narrative   Not on file   Social Determinants of Health   Financial Resource Strain: Low Risk  (06/16/2022)   Overall Financial Resource Strain (CARDIA)    Difficulty of Paying Living Expenses: Not hard at all  Food Insecurity: No Food Insecurity (06/14/2022)   Hunger Vital Sign    Worried About Running Out of Food in the Last Year: Never true    Ran Out of Food in the Last Year: Never true  Transportation Needs: No Transportation Needs (06/14/2022)   PRAPARE - Administrator, Civil Service (Medical): No    Lack of Transportation (Non-Medical): No  Physical Activity: Not on file  Stress: Not on file  Social Connections: Not on file  Intimate Partner Violence: Not At Risk (06/14/2022)   Humiliation, Afraid, Rape, and Kick questionnaire    Fear of Current or Ex-Partner: No    Emotionally Abused: No    Physically Abused: No    Sexually Abused: No    No family history on file.  BP 114/62   Pulse 69   Wt 123.5 kg (272 lb 3.2 oz)   SpO2 97%   BMI 33.13 kg/m   Wt Readings from Last 3 Encounters:  08/29/22 123.5 kg (272 lb 3.2 oz)  07/29/22 117.5 kg (259 lb)  06/29/22 116.1 kg (256 lb)   PHYSICAL EXAM: General:  NAD. No resp difficulty, walked into clinic HEENT: Normal Neck: Supple. JVP 10. Carotids 2+ bilat; no bruits. No lymphadenopathy or thryomegaly appreciated. Cor: PMI nondisplaced. Regular rate & rhythm. No rubs, gallops or murmurs. Lungs: Clear Abdomen:  Soft, nontender, nondistended. No hepatosplenomegaly. No bruits or masses. Good bowel sounds. Extremities: No cyanosis, clubbing, rash, 1+ BLE pre-tibial edema L>R Neuro: Alert & oriented x 3, cranial nerves grossly intact. Moves all 4 extremities w/o difficulty. Affect pleasant.  ReDs: 43%  ASSESSMENT & PLAN: 1. Chronic Systolic Heart Failure, NICM - Echo (1/24): EF 25-30%, moderate asymmetric LVH, RV moderately reduced, severe MR, mild to moderate TR - LHC (2012): normal coronaries. No ICD.  - Echo (05/20/22): EF 45-50%, mild MR - NYHA II. Volume up  but not markedly so, weight up 12-13 lbs. REDs 43% (exam does not suggest massive volume overload, suspect body habitus contributing somewhat to elevated ReDs reading). - Increase torsemide to 40 mg daily, add 20 KCL daily. - Continue Toprol XL 25 mg daily.  - Continue digoxin 0.125 mg daily. Consider stopping with improved EF. Dig level 0.6 (06/29/22) - Continue spiro 25 mg daily. - Continue Entresto 24-26 mg bid. BP too soft for titration.  - Continue Farxiga 10 mg daily - Labs today. Repeat BMET and dig trough in 7-10 days.   Paroxsysmal Atrial Flutter  - Had Afib ablation 2012 and DC-CV 2019, 2020 and 2024 - intolerant of amio (itching) - Saw Dr. Elberta Fortis 2/24. Ablation offered but he decided to defer  - Started Tikosyn (06/16/22). Converted to NSR. Tolerated load - Continue Tikosyn 250 mcg bid. Maintaining NSR. QT/QTc ok on ECG from 06/29/22. K 4.8. Mg 2.4 06/27/22. - Continue digoxin, as above consider stopping now that he is in NSR and EF improved. - Continue Toprol XL - Continue Xarelto 10 mg daily.  - Possible ablation down the road - Needs to finish home sleep study   Severe MR - Mild to moderate on echo 05/20/22    HTN - BP stable. - GDMT per above.   5. CKD Stage IIIa - Baseline SCr 1.5-1.7 - Continue Farxiga.  - BMET today.  Follow up in 3 months with Dr. Gala Romney.  Anderson Malta [place], FNP 08/29/22

## 2022-08-29 NOTE — Patient Instructions (Addendum)
Thank you for coming in today  If you had labs drawn today, any labs that are abnormal the clinic will call you No news is good news  Medications: Increase Torsemide to 40 mg daily START Potassium 20 meq daily   Follow up appointments:  Your physician recommends that you schedule a follow-up appointment in:  3-4 months with Dr. Gala Romney You will receive a reminder letter in the mail a few months in advance. If you don't receive a letter, please call our office to schedule the follow-up appointment.    Do the following things EVERYDAY: Weigh yourself in the morning before breakfast. Write it down and keep it in a log. Take your medicines as prescribed Eat low salt foods--Limit salt (sodium) to 2000 mg per day.  Stay as active as you can everyday Limit all fluids for the day to less than 2 liters   At the [facility], you and your health needs are our priority. As part of our continuing mission to provide you with exceptional heart care, we have created designated Provider Care Teams. These Care Teams include your primary Cardiologist (physician) and Advanced Practice Providers (APPs- Physician Assistants and Nurse Practitioners) who all work together to provide you with the care you need, when you need it.   You may see any of the following providers on your designated Care Team at your next follow up: Dr Arvilla Meres Dr Marca Ancona Dr. Marcos Eke, NP Robbie Lis, [place] [facility], [place] Brynda Peon, NP Karle Plumber, PharmD   Please be sure to bring in all your medications bottles to every appointment.    Thank you for choosing [facility]  If you have any questions or concerns before your next appointment please send [place] a message through [place] or call our office at [phone].    TO LEAVE A MESSAGE FOR THE NURSE SELECT OPTION 2, PLEASE LEAVE A MESSAGE INCLUDING: YOUR  NAME DATE OF BIRTH CALL BACK NUMBER REASON FOR CALL**this is important as we prioritize the call backs  YOU WILL RECEIVE A CALL BACK THE SAME DAY AS LONG AS YOU CALL BEFORE 4:00 PM

## 2022-09-07 ENCOUNTER — Ambulatory Visit (HOSPITAL_COMMUNITY)
Admission: RE | Admit: 2022-09-07 | Discharge: 2022-09-07 | Disposition: A | Discharge status: Home / Self Care | Payer: 59 | Source: Ambulatory Visit | Attending: Internal Medicine | Admitting: Internal Medicine

## 2022-09-07 DIAGNOSIS — I5022 Chronic systolic (congestive) heart failure: Secondary | ICD-10-CM | POA: Diagnosis present

## 2022-09-07 DIAGNOSIS — I4891 Unspecified atrial fibrillation: Secondary | ICD-10-CM | POA: Diagnosis not present

## 2022-09-07 LAB — BASIC METABOLIC PANEL
Anion gap: 8 (ref 5–15)
BUN: 42 mg/dL — ABNORMAL HIGH (ref 8–23)
CO2: 27 mmol/L (ref 22–32)
Calcium: 9.3 mg/dL (ref 8.9–10.3)
Chloride: 100 mmol/L (ref 98–111)
Creatinine, Ser: 1.82 mg/dL — ABNORMAL HIGH (ref 0.61–1.24)
GFR, Estimated: 39 mL/min — ABNORMAL LOW (ref >60)
Glucose, Bld: 99 mg/dL (ref 70–99)
Potassium: 4.6 mmol/L (ref 3.5–5.1)
Sodium: 135 mmol/L (ref 135–145)

## 2022-09-07 LAB — DIGOXIN LEVEL: Digoxin Level: 0.5 ng/mL — ABNORMAL LOW (ref 0.8–2.0)

## 2022-09-19 ENCOUNTER — Other Ambulatory Visit (HOSPITAL_COMMUNITY): Payer: Self-pay

## 2022-09-20 ENCOUNTER — Other Ambulatory Visit: Payer: Self-pay | Admitting: Cardiology

## 2022-09-20 MED ORDER — RIVAROXABAN 20 MG PO TABS: 20.0000 mg | ORAL_TABLET | Freq: Every day | ORAL | 5 refills | Status: DC

## 2022-09-20 MED ORDER — DIGOXIN 125 MCG PO TABS: 0.1250 mg | ORAL_TABLET | Freq: Every day | ORAL | 5 refills | Status: DC

## 2022-09-20 MED ORDER — METOPROLOL SUCCINATE ER 25 MG PO TB24: 25.0000 mg | ORAL_TABLET | Freq: Every day | ORAL | 5 refills | Status: DC

## 2022-09-27 ENCOUNTER — Telehealth (HOSPITAL_COMMUNITY): Payer: Self-pay | Admitting: Cardiology

## 2022-09-27 NOTE — Telephone Encounter (Signed)
Pt called to report his employer needs a RTW update. Most recent form completed is without ending date for restrictions.  Update is needed before the end of the week

## 2022-09-28 NOTE — Telephone Encounter (Signed)
Form updated with end date of restrictions 10/17/22, pt aware and picked up

## 2022-10-11 ENCOUNTER — Other Ambulatory Visit (HOSPITAL_COMMUNITY): Payer: Self-pay | Admitting: Family Medicine

## 2022-10-27 ENCOUNTER — Other Ambulatory Visit (HOSPITAL_COMMUNITY): Payer: Self-pay | Admitting: Cardiology

## 2022-12-11 ENCOUNTER — Other Ambulatory Visit: Payer: Self-pay | Admitting: Physician Assistant

## 2022-12-12 ENCOUNTER — Other Ambulatory Visit (HOSPITAL_COMMUNITY): Payer: Self-pay

## 2022-12-12 MED ORDER — DOFETILIDE 250 MCG PO CAPS
250.0000 ug | ORAL_CAPSULE | Freq: Two times a day (BID) | ORAL | 2 refills | Status: DC
  Filled 2022-12-12: qty 60, 30d supply, fill #0
  Filled 2023-01-11: qty 60, 30d supply, fill #1
  Filled 2023-02-08 (×2): qty 60, 30d supply, fill #2

## 2023-02-08 ENCOUNTER — Other Ambulatory Visit (HOSPITAL_COMMUNITY): Payer: Self-pay

## 2023-03-01 ENCOUNTER — Other Ambulatory Visit: Payer: Self-pay | Admitting: Cardiology

## 2023-03-02 ENCOUNTER — Other Ambulatory Visit (HOSPITAL_COMMUNITY): Payer: Self-pay

## 2023-03-02 MED ORDER — DOFETILIDE 250 MCG PO CAPS
250.0000 ug | ORAL_CAPSULE | Freq: Two times a day (BID) | ORAL | 0 refills | Status: DC
  Filled 2023-03-02: qty 60, 30d supply, fill #0

## 2023-03-11 ENCOUNTER — Other Ambulatory Visit (HOSPITAL_COMMUNITY): Payer: Self-pay | Admitting: Family Medicine

## 2023-03-11 ENCOUNTER — Other Ambulatory Visit: Payer: Self-pay | Admitting: Cardiology

## 2023-03-21 ENCOUNTER — Other Ambulatory Visit: Payer: Self-pay | Admitting: Cardiology

## 2023-03-23 ENCOUNTER — Other Ambulatory Visit (HOSPITAL_COMMUNITY): Payer: Self-pay | Admitting: Family Medicine

## 2023-03-23 NOTE — Telephone Encounter (Signed)
 Prescription refill request for Xarelto  received.  Indication: AF Last office visit: 07/29/22  LELON Norton MD Weight: 117.5kg Age: 72 Scr: 1.82 on 09/07/22  Epic CrCl: 60.97  Based on above findings Xarelto  20mg  daily is the appropriate dose.  Refill approved.

## 2023-04-11 ENCOUNTER — Other Ambulatory Visit: Payer: Self-pay | Admitting: Cardiology

## 2023-04-13 ENCOUNTER — Other Ambulatory Visit (HOSPITAL_COMMUNITY): Payer: Self-pay

## 2023-04-13 MED ORDER — DOFETILIDE 250 MCG PO CAPS
250.0000 ug | ORAL_CAPSULE | Freq: Two times a day (BID) | ORAL | 1 refills | Status: DC
  Filled 2023-04-13 – 2023-04-24 (×2): qty 180, 90d supply, fill #0
  Filled 2023-07-19: qty 180, 90d supply, fill #1

## 2023-04-21 ENCOUNTER — Other Ambulatory Visit (HOSPITAL_COMMUNITY): Payer: Self-pay

## 2023-04-24 ENCOUNTER — Other Ambulatory Visit (HOSPITAL_COMMUNITY): Payer: Self-pay

## 2023-06-05 NOTE — Progress Notes (Unsigned)
 Cardiology Office Note Date:  06/05/2023  Patient ID:  Duane, Mcmillan Aug 20, 1950, MRN [id number] PCP:  Renaye Rakers, MD  Cardiologist:  Dr. Gala Romney Electrophysiologist: Dr. Elberta Fortis    Chief Complaint:  overdue Tikosyn visit  History of Present Illness: [name] is a 73 y.o. male with history of HTN, NICM, chronic CHF (systolic), AFib.  He was admitted 06/14/22 via the HF clinic with acute/chronic CHF exacerbation and rapid Aflutter.  He was diuresed and then started on Tikosyn. He did have some QT lengthening and dose reduced with stabilization. Discharged 06/20/22.  Following with EP and HF teams since  Saw Dr. Elberta Fortis 07/29/22, maintaining SR, no changes were made  Saw HF team last 08/29/22, not SOB walking or pushing grocery cart. He is back at work (works in [employer], lifting heavy boxes), torsemide increased/K+ added   TODAY  He is doing well A few months ago retired (semi-forced retirement, but happy with it) Not as active as he was on the job, though plans to get moving/walking/exercising No CP, palpitations, cardiac awareness Checks his heart rhythm on his Kardia at least once a week with no signs of AFib No SOB No near syncope or syncope No bleeding or signs of bleeding  AFib/AAD hx Goes as far back as 2012 then a known diagnosis convergent ablation in 2012,  Dr. Smith Robert at [facility]  Has had prior tachy-mediated CM associated with his AF Itching w/amio Tikosyn started March 2024   Past Medical History:  Diagnosis Date   Atrial tachycardia    Chronic HFrEF (heart failure with reduced ejection fraction) (HCC)    Hypertension    Mitral regurgitation    NICM (nonischemic cardiomyopathy) (HCC)    a. 2009 EF 25%; b. 05/2010 Cath: nl cors. EF 45%; c. 12/2017 Echo: EF 45-50%, diff HK, mild MR, sev dil LA.   Paroxysmal atrial flutter (HCC)    a. Dx in 05/2018.   Persistent atrial fibrillation (HCC)    a. 2012 s/p RFCA; b. 08/2017 s/p DCCV.    Past  Surgical History:  Procedure Laterality Date   CARDIAC ELECTROPHYSIOLOGY STUDY AND ABLATION  2012   Dr. Riccardo Dubin   CARDIOVERSION N/A 09/12/2017   Procedure: CARDIOVERSION;  Surgeon: Jake Bathe, MD;  Location: [facility] ENDOSCOPY;  Service: Cardiovascular;  Laterality: N/A;   CARDIOVERSION N/A 03/25/2022   Procedure: CARDIOVERSION;  Surgeon: Laurey Morale, MD;  Location: [facility] ENDOSCOPY;  Service: Cardiovascular;  Laterality: N/A;   LOOP RECORDER IMPLANT  06/11/2010   TEE WITHOUT CARDIOVERSION N/A 09/12/2017   Procedure: TRANSESOPHAGEAL ECHOCARDIOGRAM (TEE);  Surgeon: Jake Bathe, MD;  Location: [facility] ENDOSCOPY;  Service: Cardiovascular;  Laterality: N/A;   TEE WITHOUT CARDIOVERSION N/A 03/25/2022   Procedure: TRANSESOPHAGEAL ECHOCARDIOGRAM (TEE);  Surgeon: Laurey Morale, MD;  Location: [facility] ENDOSCOPY;  Service: Cardiovascular;  Laterality: N/A;    Current Outpatient Medications  Medication Sig Dispense Refill   dapagliflozin propanediol (FARXIGA) 10 MG TABS tablet TAKE 1 TABLET(10 MG) BY MOUTH DAILY 30 tablet 11   digoxin (LANOXIN) 0.125 MG tablet TAKE 1 TABLET(0.125 MG) BY MOUTH DAILY 30 tablet 5   dofetilide (TIKOSYN) 250 MCG capsule Take 1 capsule (250 mcg total) by mouth 2 (two) times daily. 180 capsule 1   metoprolol succinate (TOPROL-XL) 25 MG 24 hr tablet TAKE 1 TABLET(25 MG) BY MOUTH DAILY 30 tablet 5   OVER THE COUNTER MEDICATION Take 5 mLs by mouth in the morning. Black seed oil.  potassium chloride SA (KLOR-CON M) 20 MEQ tablet TAKE 1 TABLET(20 MEQ) BY MOUTH DAILY 30 tablet 6   sacubitril-valsartan (ENTRESTO) 24-26 MG Take 1 tablet by mouth 2 (two) times daily. 60 tablet 11   sildenafil (VIAGRA) 25 MG tablet Take 1 tablet (25 mg total) by mouth daily as needed for erectile dysfunction. 10 tablet 0   spironolactone (ALDACTONE) 25 MG tablet Take 1 tablet (25 mg total) by mouth daily. 90 tablet 3   torsemide (DEMADEX) 20 MG tablet TAKE 2 TABLETS(40 MG) BY MOUTH DAILY 60 tablet 6    XARELTO 20 MG TABS tablet TAKE 1 TABLET(20 MG) BY MOUTH DAILY WITH SUPPER 30 tablet 5   No current facility-administered medications for this visit.    Allergies:   Pacerone [amiodarone]   Social History:  The patient  reports that he has never smoked. He has never used smokeless tobacco. He reports current alcohol use. He reports that he does not use drugs.   Family History:  The patient's family history is not on file.  ROS:  Please see the history of present illness.    All other systems are reviewed and otherwise negative.   PHYSICAL EXAM:  VS:  There were no vitals taken for this visit. BMI: There is no height or weight on file to calculate BMI. Well nourished, well developed, in no acute distress HEENT: normocephalic, atraumatic Neck: no JVD, carotid bruits or masses Cardiac:  RRR; no significant murmurs, no rubs, or gallops Lungs: CTA b/l, no wheezing, rhonchi or rales Abd: soft, nontender MS: no deformity or atrophy Ext: wearing support stockings, trace if any edema b/l Skin: warm and dry, no rash Neuro:  No gross deficits appreciated Psych: euthymic mood, full affect   EKG:  Done today and reviewed by myself shows  SR 88bpm, T changes seen on prior EKGs, QTc  05/20/22: TTE (limited) 1. Limited images   2. Left ventricular ejection fraction, by estimation, is 45 to 50%. Left  ventricular ejection fraction by 2D MOD biplane is 47.4 %. Left  ventricular ejection fraction by PLAX is 47 %. The left ventricle has  mildly decreased function. The left ventricle  demonstrates global hypokinesis. The left ventricular internal cavity size  was moderately dilated. There is moderate left ventricular hypertrophy.  Left ventricular diastolic parameters are indeterminate.   3. Right ventricular systolic function was not well visualized. The right  ventricular size is not well visualized.   4. The mitral valve is abnormal. Mild to moderate mitral valve  regurgitation.   5.  The aortic valve is tricuspid. Aortic valve regurgitation is not  visualized.   6. The inferior vena cava is normal in size with greater than 50%  respiratory variability, suggesting right atrial pressure of 3 mmHg.   Comparison(s): Changes from prior study are noted. 03/26/2022: LVEF 25-30%,  severe MR.    03/26/22; TTE 1. Left ventricular ejection fraction, by estimation, is 25 to 30%. The  Left ventricle has severely decreased function. The left ventricle has no  regional wall motion abnormalities. There is moderate asymmetric left  ventricular hypertrophy of the  lateral segment. Left ventricular diastolic function could not be  evaluated. There is the interventricular septum is flattened in systole  and diastole, consistent with right ventricular pressure and volume  overload.   2. Right ventricular systolic function is moderately reduced. The right  ventricular size is moderately enlarged.   3. Left atrial size was severely dilated.   4. Right atrial size  was severely dilated.   5. The mitral valve is abnormal. Severe mitral valve regurgitation. No  evidence of mitral stenosis.   6. Tricuspid valve regurgitation is mild to moderate.   7. The aortic valve is tricuspid. Aortic valve regurgitation is trivial.  No aortic stenosis is present.   Comparison(s): Prior images unable to be directly viewed, comparison made  by report only. Changes from prior study are noted. LVEF improved from 20%  in 2019 to 30% now. Mitral regurgitation is severe now.    03/25/22: TEE 1. Left ventricular ejection fraction, by estimation, is 20 to 25%. The  left ventricle has severely decreased function. The left ventricle  demonstrates global hypokinesis.   2. Right ventricular systolic function is moderately reduced. The right  ventricular size is mildly enlarged.   3. Left atrial size was severely dilated. No left atrial/left atrial  appendage thrombus was detected.   4. Right atrial size was  severely dilated.   5. There was severe central mitral regurgitation, suspect atrial  functional MR. PISA ERO 0.44 cm^2. There was flattening but not flow  reversal of the pulmonary venous systolic doppler signal.   6. Peak RV-RA gradient 39 mmHg. Tricuspid valve regurgitation is severe.   7. The aortic valve is tricuspid. Aortic valve regurgitation is not  visualized. No aortic stenosis is present.   8. There was a PFO noted by color doppler.    01/15/2018: TTE - Left ventricle: The cavity size was severely dilated. Wall    thickness was normal. Systolic function was mildly reduced. The    estimated ejection fraction was in the range of 45% to 50%.    Diffuse hypokinesis.  - Mitral valve: Calcified annulus. There was mild regurgitation.  - Left atrium: The atrium was severely dilated.  - Right atrium: The atrium was mildly dilated.   09/12/2017: TEE - Left ventricle: The cavity size was dilated. Systolic function    was severely reduced. The estimated ejection fraction was in the    range of 15% to 20%. Diffuse hypokinesis.  - Aortic valve: No evidence of vegetation.  - Mitral valve: No evidence of vegetation. There was mild to    moderate regurgitation.  - Left atrium: The atrium was dilated. No evidence of thrombus in    the atrial cavity or appendage. No evidence of thrombus in the    appendage.  - Right ventricle: Systolic function was reduced.  - Right atrium: The atrium was dilated. No evidence of thrombus in    the atrial cavity or appendage.  - Atrial septum: No defect or patent foramen ovale was identified.    There was no right-to-left atrial level shunt, following an    increase in RA pressure induced by provocative maneuvers.  - Tricuspid valve: No evidence of vegetation.  - Pulmonic valve: No evidence of vegetation.    Recent Labs: 06/14/2022: ALT 21; TSH 0.183 06/20/2022: Hemoglobin 14.6; Platelets 141 06/27/2022: Magnesium 2.4 08/29/2022: B Natriuretic Peptide  124.8 09/07/2022: BUN 42; Creatinine, Ser 1.82; Potassium 4.6; Sodium 135  No results found for requested labs within last 365 days.   CrCl cannot be calculated (Patient's most recent lab result is older than the maximum 21 days allowed.).   Wt Readings from Last 3 Encounters:  08/29/22 272 lb 3.2 oz (123.5 kg)  07/29/22 259 lb (117.5 kg)  06/29/22 256 lb (116.1 kg)     Other studies reviewed: Additional studies/records reviewed today include: summarized above  ASSESSMENT AND PLAN:  Persistent  AFib CHA2DS2Vasc is 3, on Xarelto, appropriately dosed Tikosyn w/stable QTc Med list reviewed  Labs today   NICM Chronic CHF (systolic) Likely tachy medicated Echo May 2024 > 45-50% No symptoms or exam findings of volume OL Over due for the HF team  MR Mild-mod by echo may 2024       Described as severe on historical echos with lower EF       C/w HF team  HTN Looks good   6. Secondary hypercoagulable state    Disposition: back in 4 mo, sooner if needed   Current medicines are reviewed at length with the patient today.  The patient did not have any concerns regarding medicines.  Norma Fredrickson, PA-C 06/05/2023 12:57 PM     [facility] [address] [place] [address] [phone] (office)  [phone] (fax)

## 2023-06-07 ENCOUNTER — Ambulatory Visit: Payer: Self-pay | Attending: Physician Assistant | Admitting: Physician Assistant

## 2023-06-07 VITALS — BP 114/70 | HR 88 | Ht 76.0 in | Wt 274.0 lb

## 2023-06-07 DIAGNOSIS — I4819 Other persistent atrial fibrillation: Secondary | ICD-10-CM | POA: Diagnosis not present

## 2023-06-07 DIAGNOSIS — Z5181 Encounter for therapeutic drug level monitoring: Secondary | ICD-10-CM | POA: Diagnosis not present

## 2023-06-07 DIAGNOSIS — I428 Other cardiomyopathies: Secondary | ICD-10-CM | POA: Diagnosis not present

## 2023-06-07 DIAGNOSIS — D6869 Other thrombophilia: Secondary | ICD-10-CM | POA: Diagnosis not present

## 2023-06-07 DIAGNOSIS — I5022 Chronic systolic (congestive) heart failure: Secondary | ICD-10-CM | POA: Diagnosis not present

## 2023-06-07 DIAGNOSIS — Z79899 Other long term (current) drug therapy: Secondary | ICD-10-CM | POA: Diagnosis not present

## 2023-06-07 NOTE — Patient Instructions (Signed)
 Medication Instructions:   Your physician recommends that you continue on your current medications as directed. Please refer to the Current Medication list given to you today.   *If you need a refill on your cardiac medications before your next appointment, please call your pharmacy*   Lab Work:   PLEASE GO DOWN STAIRS  [facility]  FIRST FLOOR  [address] ( GET OFF ELEVATORS MAKE A LEFT AND ANOTHER LEFT LAB ON RIGHT DOWN HALLWAY :  BMET  MAG AND CBC AND DIGOXIN    If you have labs (blood work) drawn today and your tests are completely normal, you will receive your results only by: MyChart Message (if you have MyChart) OR A paper copy in the mail If you have any lab test that is abnormal or we need to change your treatment, we will call you to review the results.   Testing/Procedures:  NONE ORDERED  TODAY     Follow-Up: At [facility], you and your health needs are our priority.  As part of our continuing mission to provide you with exceptional heart care, we have created designated Provider Care Teams.  These Care Teams include your primary Cardiologist (physician) and Advanced Practice Providers (APPs -  Physician Assistants and Nurse Practitioners) who all work together to provide you with the care you need, when you need it.  We recommend signing up for the patient portal called "MyChart".  Sign up information is provided on this After Visit Summary.  MyChart is used to connect with patients for Virtual Visits (Telemedicine).  Patients are able to view lab/test results, encounter notes, upcoming appointments, etc.  Non-urgent messages can be sent to your provider as well.   To learn more about what you can do with MyChart, go to [email or web address].    Your next appointment:  HEART FAILURE NEXT AVAILABLE DR BENSIIMHON  4 month(s) ( CONTACT  CASSIE HALL/ ANGELINE HAMMER FOR EP SCHEDULING ISSUES )   Provider:    Loman Brooklyn, MD or Francis Dowse, PA-C    Other  Instructions   1st Floor: - Lobby - Registration  - Pharmacy  - Lab - Cafe  2nd Floor: - PV Lab - Diagnostic Testing (echo, CT, nuclear med)  3rd Floor: - Vacant  4th Floor: - TCTS (cardiothoracic surgery) - AFib Clinic - Structural Heart Clinic - Vascular Surgery  - Vascular Ultrasound  5th Floor: - HeartCare Cardiology (general and EP) - Clinical Pharmacy for coumadin, hypertension, lipid, weight-loss medications, and med management appointments    Valet parking services will be available as well.

## 2023-06-08 LAB — CBC
Hematocrit: 41.2 % (ref 37.5–51.0)
Hemoglobin: 13.9 g/dL (ref 13.0–17.7)
MCH: 31.1 pg (ref 26.6–33.0)
MCHC: 33.7 g/dL (ref 31.5–35.7)
MCV: 92 fL (ref 79–97)
Platelets: 152 10*3/uL (ref 150–450)
RBC: 4.47 x10E6/uL (ref 4.14–5.80)
RDW: 12.6 % (ref 11.6–15.4)
WBC: 4.5 10*3/uL (ref 3.4–10.8)

## 2023-06-08 LAB — BASIC METABOLIC PANEL
BUN/Creatinine Ratio: 10 (ref 10–24)
BUN: 13 mg/dL (ref 8–27)
CO2: 24 mmol/L (ref 20–29)
Calcium: 10 mg/dL (ref 8.6–10.2)
Chloride: 103 mmol/L (ref 96–106)
Creatinine, Ser: 1.35 mg/dL — ABNORMAL HIGH (ref 0.76–1.27)
Glucose: 96 mg/dL (ref 70–99)
Potassium: 4.5 mmol/L (ref 3.5–5.2)
Sodium: 141 mmol/L (ref 134–144)
eGFR: 56 mL/min/{1.73_m2} — ABNORMAL LOW (ref >59)

## 2023-06-08 LAB — DIGOXIN LEVEL: Digoxin, Serum: 0.7 ng/mL (ref 0.5–0.9)

## 2023-06-08 LAB — MAGNESIUM: Magnesium: 2.2 mg/dL (ref 1.6–2.3)

## 2023-06-21 ENCOUNTER — Telehealth (HOSPITAL_COMMUNITY): Payer: Self-pay

## 2023-06-21 NOTE — Telephone Encounter (Signed)
 Advanced Heart Failure Patient Advocate Encounter  Application for Farxiga faxed to [employer] on 06/21/2023. Application form attached to patient chart.  Burnell Blanks, CPhT Rx Patient Advocate Phone: [phone]

## 2023-06-22 NOTE — Telephone Encounter (Signed)
 Patient was approved to receive Farxiga from [employer] Effective 06/21/2023 to 03/20/2024  Informed patient by phone, he will be expecting the first delivery within 7-10 days.

## 2023-07-19 ENCOUNTER — Other Ambulatory Visit (HOSPITAL_COMMUNITY): Payer: Self-pay

## 2023-07-24 ENCOUNTER — Ambulatory Visit (HOSPITAL_COMMUNITY)
Admission: RE | Admit: 2023-07-24 | Discharge: 2023-07-24 | Disposition: A | Discharge status: Home / Self Care | Source: Ambulatory Visit | Attending: Internal Medicine | Admitting: Internal Medicine

## 2023-07-24 ENCOUNTER — Encounter (HOSPITAL_COMMUNITY): Payer: Self-pay | Admitting: Internal Medicine

## 2023-07-24 VITALS — BP 130/70 | HR 67 | Wt 272.4 lb

## 2023-07-24 DIAGNOSIS — I130 Hypertensive heart and chronic kidney disease with heart failure and stage 1 through stage 4 chronic kidney disease, or unspecified chronic kidney disease: Secondary | ICD-10-CM | POA: Diagnosis not present

## 2023-07-24 DIAGNOSIS — I498 Other specified cardiac arrhythmias: Secondary | ICD-10-CM | POA: Diagnosis not present

## 2023-07-24 DIAGNOSIS — I4819 Other persistent atrial fibrillation: Secondary | ICD-10-CM | POA: Diagnosis not present

## 2023-07-24 DIAGNOSIS — I428 Other cardiomyopathies: Secondary | ICD-10-CM | POA: Insufficient documentation

## 2023-07-24 DIAGNOSIS — I5022 Chronic systolic (congestive) heart failure: Secondary | ICD-10-CM | POA: Insufficient documentation

## 2023-07-24 DIAGNOSIS — N183 Chronic kidney disease, stage 3 unspecified: Secondary | ICD-10-CM | POA: Diagnosis not present

## 2023-07-24 DIAGNOSIS — Z79899 Other long term (current) drug therapy: Secondary | ICD-10-CM | POA: Diagnosis not present

## 2023-07-24 DIAGNOSIS — I4892 Unspecified atrial flutter: Secondary | ICD-10-CM | POA: Insufficient documentation

## 2023-07-24 DIAGNOSIS — I440 Atrioventricular block, first degree: Secondary | ICD-10-CM | POA: Diagnosis not present

## 2023-07-24 DIAGNOSIS — N1831 Chronic kidney disease, stage 3a: Secondary | ICD-10-CM | POA: Insufficient documentation

## 2023-07-24 DIAGNOSIS — Z7901 Long term (current) use of anticoagulants: Secondary | ICD-10-CM | POA: Diagnosis not present

## 2023-07-24 DIAGNOSIS — I10 Essential (primary) hypertension: Secondary | ICD-10-CM | POA: Diagnosis not present

## 2023-07-24 LAB — COMPREHENSIVE METABOLIC PANEL WITH GFR
ALT: 10 U/L (ref 0–44)
AST: 18 U/L (ref 15–41)
Albumin: 3.5 g/dL (ref 3.5–5.0)
Alkaline Phosphatase: 48 U/L (ref 38–126)
Anion gap: 8 (ref 5–15)
BUN: 12 mg/dL (ref 8–23)
CO2: 24 mmol/L (ref 22–32)
Calcium: 10.3 mg/dL (ref 8.9–10.3)
Chloride: 106 mmol/L (ref 98–111)
Creatinine, Ser: 1.34 mg/dL — ABNORMAL HIGH (ref 0.61–1.24)
GFR, Estimated: 56 mL/min — ABNORMAL LOW (ref >60)
Glucose, Bld: 90 mg/dL (ref 70–99)
Potassium: 4.2 mmol/L (ref 3.5–5.1)
Sodium: 138 mmol/L (ref 135–145)
Total Bilirubin: 0.7 mg/dL (ref 0.0–1.2)
Total Protein: 7.8 g/dL (ref 6.5–8.1)

## 2023-07-24 LAB — MAGNESIUM: Magnesium: 2.2 mg/dL (ref 1.7–2.4)

## 2023-07-24 LAB — BRAIN NATRIURETIC PEPTIDE: B Natriuretic Peptide: 84.4 pg/mL (ref 0.0–100.0)

## 2023-07-24 NOTE — Patient Instructions (Signed)
 Medication Changes:  STOP TAKING DIGOXIN     Lab Work:  Labs done today, your results will be available in MyChart, we will contact you for abnormal readings.  ECHOCARDIOGRAM AS SCHEDULED   Follow-Up in: 9 MONTHS PLEASE CALL OUR OFFICE AROUND NOVEMBER TO GET SCHEDULED FOR YOUR APPOINTMENT. PHONE NUMBER IS [phone] OPTION 2    At the [facility], you and your health needs are our priority. We have a designated team specialized in the treatment of Heart Failure. This Care Team includes your primary Heart Failure Specialized Cardiologist (physician), Advanced Practice Providers (APPs- Physician Assistants and Nurse Practitioners), and Pharmacist who all work together to provide you with the care you need, when you need it.   You may see any of the following providers on your designated Care Team at your next follow up:  Dr. Jules Oar Dr. Peder Bourdon Dr. Alwin Baars Dr. Judyth Nunnery Nieves Bars, NP Ruddy Corral, [place] [facility] [place], [place] Dennise Fitz, NP [place] Lee, NP Luster Salters, PharmD   Please be sure to bring in all your medications bottles to every appointment.   Need to Contact Us :  If you have any questions or concerns before your next appointment please send us  a message through [place] or call our office at [phone].    TO LEAVE A MESSAGE FOR THE NURSE SELECT OPTION 2, PLEASE LEAVE A MESSAGE INCLUDING: YOUR NAME DATE OF BIRTH CALL BACK NUMBER REASON FOR CALL**this is important as we prioritize the call backs  YOU WILL RECEIVE A CALL BACK THE SAME DAY AS LONG AS YOU CALL BEFORE 4:00 PM

## 2023-07-24 NOTE — Addendum Note (Signed)
 Encounter addended by: Aubrey Blackard M, RN on: 07/24/2023 4:39 PM  Actions taken: Clinical Note Signed

## 2023-07-24 NOTE — Progress Notes (Signed)
 Prescription application from [employer] sent in for patient at his request. Confirmation of fax received. [phone] [id number]

## 2023-07-24 NOTE — Progress Notes (Signed)
 Advanced Heart Failure Clinic Note   PCP: Jonathon Neighbors, MD HF Cardiologist: Jules Oar, MD   HPI: [name] is a 73 y.o. male with persistent afib (s/p ablation 2012, DCCV 09/12/17), HTN, and chronic systolic HF due to NICM.    Underwent DC-CV of AF on 09/12/17.    Admitted 6/27-09/18/17 from afib clinic with volume overload and AKI. Echo EF 15-20% HF team consulted. Echo showed EF 15-20%.   In 05/2018, volume up in setting of AFL with RVR. Taken to ED for diuresis and DCCV, converted to NSR. Unfortunately, lost to follow up.   Seen in HF clinic 03/24/22, and was found to be in A flutter RVR and volume overloaded. Admitted. Underwent TEE/DCCV. TEE showed EF 20-25% with severe central MR, DCCV successful to NSR. Became hypotensive post DCCV, and placed on NE. Amio gtt started. TTE EF 25-30%, RV moderately down, severe MR. Drips eventually weaned off, unable to tolerate amio 2/2 itching. EP planning to discuss ablation outpatient. GDMT titrated and he was discharged home, weight 243 lbs.    Echo (05/20/22): EF 45-50% mild MR  Seen by Dr. Lawana Pray in EP clinic. Ablation offered (vs Tikosyn ). Pt opted on Tikosyn  and potential ablation in the future.    Seen in [facility] 3/24 prior to Tikosyn  load and was noted to be volume overloaded. He was admitted for IV diuresis and HF optimization prior to Tikosyn  loading. Once euvolemic, he was loaded w/ Tikosyn  and tolerated ok. Discharged on 250 mcg bid. Transitioned back to PO torsemide  and other GDMT. Discharged home, weight 249 lbs.   Today he returns for HF follow up. Doing well. No CP or SOB. Compliant with meds. Retired on 03/21/23. Not very active. Compliant with meds. No CP or SOB. No edema, orthopnea or PND. No bleeding with Xarelto   Cardiac Studies - Echo (3/24): EF 45-50%, mild MR - Echo (1/24): 25-30%, RV moderately down, severe MR - TEE (1/24): EF 20-25% with severe central MR - Echo (2019): EF 15-20%  Past Medical History:   Diagnosis Date   Atrial tachycardia (HCC)    Chronic HFrEF (heart failure with reduced ejection fraction) (HCC)    Hypertension    Mitral regurgitation    NICM (nonischemic cardiomyopathy) (HCC)    a. 2009 EF 25%; b. 05/2010 Cath: nl cors. EF 45%; c. 12/2017 Echo: EF 45-50%, diff HK, mild MR, sev dil LA.   Paroxysmal atrial flutter (HCC)    a. Dx in 05/2018.   Persistent atrial fibrillation (HCC)    a. 2012 s/p RFCA; b. 08/2017 s/p DCCV.   Current Outpatient Medications  Medication Sig Dispense Refill   dapagliflozin  propanediol (FARXIGA ) 10 MG TABS tablet TAKE 1 TABLET(10 MG) BY MOUTH DAILY 30 tablet 11   digoxin  (LANOXIN ) 0.125 MG tablet TAKE 1 TABLET(0.125 MG) BY MOUTH DAILY 30 tablet 5   dofetilide  (TIKOSYN ) 250 MCG capsule Take 1 capsule (250 mcg total) by mouth 2 (two) times daily. 180 capsule 1   metoprolol  succinate (TOPROL -XL) 25 MG 24 hr tablet TAKE 1 TABLET(25 MG) BY MOUTH DAILY 30 tablet 5   OVER THE COUNTER MEDICATION Take 5 mLs by mouth in the morning. Black seed oil.     potassium chloride  SA (KLOR-CON  M) 20 MEQ tablet TAKE 1 TABLET(20 MEQ) BY MOUTH DAILY 30 tablet 6   sacubitril -valsartan  (ENTRESTO ) 24-26 MG Take 1 tablet by mouth 2 (two) times daily. 60 tablet 11   sildenafil  (VIAGRA ) 25 MG tablet Take 1 tablet (25 mg total)  by mouth daily as needed for erectile dysfunction. 10 tablet 0   spironolactone  (ALDACTONE ) 25 MG tablet Take 1 tablet (25 mg total) by mouth daily. 90 tablet 3   torsemide  (DEMADEX ) 20 MG tablet TAKE 2 TABLETS(40 MG) BY MOUTH DAILY 60 tablet 6   XARELTO  20 MG TABS tablet TAKE 1 TABLET(20 MG) BY MOUTH DAILY WITH SUPPER 30 tablet 5   No current facility-administered medications for this encounter.   Allergies  Allergen Reactions   Pacerone  [Amiodarone ] Itching and Rash   Social History   Socioeconomic History   Marital status: Widowed    Spouse name: Not on file   Number of children: Not on file   Years of education: Not on file   Highest  education level: Not on file  Occupational History   Not on file  Tobacco Use   Smoking status: Never   Smokeless tobacco: Never  Vaping Use   Vaping status: Never Used  Substance and Sexual Activity   Alcohol use: Yes    Comment: rare   Drug use: Never   Sexual activity: Not on file  Other Topics Concern   Not on file  Social History Narrative   Not on file   Social Drivers of Health   Financial Resource Strain: Low Risk  (06/16/2022)   Overall Financial Resource Strain (CARDIA)    Difficulty of Paying Living Expenses: Not hard at all  Food Insecurity: No Food Insecurity (06/14/2022)   Hunger Vital Sign    Worried About Running Out of Food in the Last Year: Never true    Ran Out of Food in the Last Year: Never true  Transportation Needs: No Transportation Needs (06/14/2022)   PRAPARE - Administrator, Civil Service (Medical): No    Lack of Transportation (Non-Medical): No  Physical Activity: Not on file  Stress: Not on file  Social Connections: Not on file  Intimate Partner Violence: Not At Risk (06/14/2022)   Humiliation, Afraid, Rape, and Kick questionnaire    Fear of Current or Ex-Partner: No    Emotionally Abused: No    Physically Abused: No    Sexually Abused: No    History reviewed. No pertinent family history.  BP 130/70   Pulse 67   Wt 123.6 kg (272 lb 6.4 oz)   SpO2 96%   BMI 33.16 kg/m   Wt Readings from Last 3 Encounters:  07/24/23 123.6 kg (272 lb 6.4 oz)  06/07/23 124.3 kg (274 lb)  08/29/22 123.5 kg (272 lb 3.2 oz)   PHYSICAL EXAM: General:  Well appearing. No resp difficulty HEENT: normal Neck: supple. no JVD. Carotids 2+ bilat; no bruits. No lymphadenopathy or thryomegaly appreciated. Cor: PMI nondisplaced. Regular rate & rhythm. No rubs, gallops or murmurs. Lungs: clear Abdomen: obese soft, nontender, nondistended. No hepatosplenomegaly. No bruits or masses. Good bowel sounds. Extremities: no cyanosis, clubbing, rash,  edema Neuro: alert & orientedx3, cranial nerves grossly intact. moves all 4 extremities w/o difficulty. Affect pleasant   ECG: Sinus 64 1AVb QTc Personally reviewed  ASSESSMENT & PLAN: 1. Chronic Systolic Heart Failure with recovered EF, NICM - Echo (1/24): EF 25-30%, moderate asymmetric LVH, RV moderately reduced, severe MR, mild to moderate TR - LHC (2012): normal coronaries. No ICD.  - Echo (05/20/22): EF 45-50%, mild MR - NYHA II though not very active - Volume status looks good on torsemide  40 daily - Continue Toprol  XL 25 mg daily.  - Continue spiro 25 mg daily. -  Continue Entresto  24-26 mg bid. BP too soft for titration.  - Continue Farxiga  10 mg daily - Encouraged more activity   Paroxsysmal Atrial Flutter  - Had Afib ablation 2012 and DC-CV 2019, 2020 and 2024 - intolerant of amio (itching) - Saw Dr. Lawana Pray 2/24. Ablation offered but he decided to defer  - Started Tikosyn  (06/16/22). Converted to NSR. Tolerated load - Continue Tikosyn  250 mcg bid. Maintaining NSR. QT/QTc ok on ECG tody ( ) - follows with EP - Check labs - Continue Toprol  XL - Continue Xarelto  10 mg daily.    Severe MR - Mild to moderate on echo 05/20/22  - Due for repeat echo    HTN - BP stable. - GDMT per above.   5. CKD Stage IIIa - Baseline SCr 1.5-1.7 - Continue Farxiga .  - Labs today   Jules Oar, MD 07/24/23

## 2023-07-24 NOTE — Addendum Note (Signed)
 Encounter addended by: Alexandru Moorer B, RN on: 07/24/2023 4:11 PM  Actions taken: Clinical Note Signed, Medication long-term status modified, Order list changed, Diagnosis association updated, Charge Capture section accepted

## 2023-07-27 ENCOUNTER — Telehealth (HOSPITAL_COMMUNITY): Payer: Self-pay

## 2023-07-27 NOTE — Telephone Encounter (Signed)
 Advanced Heart Failure Patient Advocate Encounter  Received notification that this patient has been approved to receive Entresto  from [employer], effective 07/25/2023 until 03/20/2024.  Kennis Peacock, CPhT Rx Patient Advocate Phone: [phone]

## 2023-09-07 ENCOUNTER — Ambulatory Visit (HOSPITAL_COMMUNITY)
Admission: RE | Admit: 2023-09-07 | Discharge: 2023-09-07 | Disposition: A | Discharge status: Home / Self Care | Source: Ambulatory Visit | Attending: Internal Medicine | Admitting: Internal Medicine

## 2023-09-07 DIAGNOSIS — I5022 Chronic systolic (congestive) heart failure: Secondary | ICD-10-CM | POA: Insufficient documentation

## 2023-09-07 DIAGNOSIS — I4891 Unspecified atrial fibrillation: Secondary | ICD-10-CM | POA: Diagnosis not present

## 2023-09-07 DIAGNOSIS — I340 Nonrheumatic mitral (valve) insufficiency: Secondary | ICD-10-CM | POA: Diagnosis not present

## 2023-09-07 LAB — ECHOCARDIOGRAM COMPLETE: S' Lateral: 3.2 cm

## 2023-09-07 NOTE — Progress Notes (Signed)
  Echocardiogram 2D Echocardiogram has been performed.  [name] 09/07/2023, 1:34 PM

## 2023-09-08 ENCOUNTER — Other Ambulatory Visit (HOSPITAL_COMMUNITY): Payer: Self-pay

## 2023-09-08 ENCOUNTER — Other Ambulatory Visit: Payer: Self-pay | Admitting: Cardiology

## 2023-09-08 MED ORDER — DOFETILIDE 250 MCG PO CAPS
250.0000 ug | ORAL_CAPSULE | Freq: Two times a day (BID) | ORAL | 2 refills | Status: AC
  Filled 2023-09-08 – 2023-10-23 (×4): qty 180, 90d supply, fill #0
  Filled 2024-03-02 – 2024-03-04 (×2): qty 180, 90d supply, fill #1

## 2023-09-25 ENCOUNTER — Other Ambulatory Visit: Payer: Self-pay

## 2023-09-25 MED ORDER — METOPROLOL SUCCINATE ER 25 MG PO TB24: 25.0000 mg | ORAL_TABLET | Freq: Every day | ORAL | 0 refills | Status: DC

## 2023-10-15 NOTE — Progress Notes (Unsigned)
 Cardiology Office Note Date:  10/18/2023  Patient ID:  [name], [name] 12-10-1950, MRN [id number] PCP:  Benjamine Aland, MD  Cardiologist:  Dr. Cherrie Electrophysiologist: Dr. Inocencio    Chief Complaint:  Tikosyn  visit  History of Present Illness: [name] is a 73 y.o. male with history of  HTN,  NICM, chronic CHF (systolic),  AFib  He was admitted 06/14/22 via the HF clinic with acute/chronic CHF exacerbation and rapid Aflutter.  He was diuresed and then started on Tikosyn . He did have some QT lengthening and dose reduced with stabilization. Discharged 06/20/22.  Following with EP and HF teams since  Saw Dr. Inocencio 07/29/22, maintaining SR, no changes were made  Saw HF team 08/29/22, not SOB walking or pushing grocery cart. He is back at work (works in [employer], lifting heavy boxes), torsemide  increased/K+ added  I saw him 06/07/23 He is doing well A few months ago retired (semi-forced retirement, but happy with it) Not as active as he was on the job, though plans to get moving/walking/exercising No CP, palpitations, cardiac awareness Checks his heart rhythm on his Kardia at least once a week with no signs of AFib No SOB No near syncope or syncope No bleeding or signs of bleeding Stable EKG/QTc Meds ok, planned for labs  He saw Dr. Cherrie 07/24/23, doing well, fairly sedentary EKG looked OK Encouraged more activity, planned for updated echo  LVEF 60-65%, no WMA, mild MR   TODAY  He continues to do very well Retired and a bit bored, looking for a job to keep him busier. No CP, palpitations or cardiac awareness No difficulties with his ADLs No symptoms of AFIb Reports excellent medication compliance  AFib/AAD hx Goes as far back as 2012 then a known diagnosis convergent ablation in 2012,  Dr. 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 2009 EF 25%; b. 05/2010 Cath: nl cors. EF 45%; c. 12/2017 Echo: EF 45-50%, diff HK, mild MR, sev dil LA.   Paroxysmal atrial flutter (HCC)    a. Dx in 05/2018.   Persistent atrial fibrillation (HCC)    a. 2012 s/p RFCA; b. 08/2017 s/p DCCV.    Past Surgical History:  Procedure Laterality Date   CARDIAC ELECTROPHYSIOLOGY STUDY AND ABLATION  2012   Dr. Debbi   CARDIOVERSION N/A 09/12/2017   Procedure: CARDIOVERSION;  Surgeon: Jeffrie Oneil BROCKS, MD;  Location: [facility] ENDOSCOPY;  Service: Cardiovascular;  Laterality: N/A;   CARDIOVERSION N/A 03/25/2022   Procedure: CARDIOVERSION;  Surgeon: Rolan Ezra RAMAN, MD;  Location: [facility] ENDOSCOPY;  Service: Cardiovascular;  Laterality: N/A;   LOOP RECORDER IMPLANT  06/11/2010   TEE WITHOUT CARDIOVERSION N/A 09/12/2017   Procedure: TRANSESOPHAGEAL ECHOCARDIOGRAM (TEE);  Surgeon: Jeffrie Oneil BROCKS, MD;  Location: [facility] ENDOSCOPY;  Service: Cardiovascular;  Laterality: N/A;   TEE WITHOUT CARDIOVERSION N/A 03/25/2022   Procedure: TRANSESOPHAGEAL ECHOCARDIOGRAM (TEE);  Surgeon: Rolan Ezra RAMAN, MD;  Location: [facility] ENDOSCOPY;  Service: Cardiovascular;  Laterality: N/A;    Current Outpatient Medications  Medication Sig Dispense Refill   dapagliflozin  propanediol (FARXIGA ) 10 MG TABS tablet TAKE 1 TABLET(10 MG) BY MOUTH DAILY 30 tablet 11  dofetilide  (TIKOSYN ) 250 MCG capsule Take 1 capsule (250 mcg total) by mouth 2 (two) times daily. 180 capsule 2   metoprolol  succinate (TOPROL -XL) 25 MG 24 hr tablet Take 1 tablet (25 mg total) by mouth daily. 30 tablet 0   OVER THE COUNTER MEDICATION Take 5 mLs by mouth in the morning. Black seed oil.     potassium chloride  SA (KLOR-CON  M) 20 MEQ tablet TAKE 1 TABLET(20 MEQ) BY MOUTH DAILY 30 tablet 6   sacubitril -valsartan  (ENTRESTO ) 24-26 MG Take  1 tablet by mouth 2 (two) times daily. 60 tablet 11   sildenafil  (VIAGRA ) 25 MG tablet Take 1 tablet (25 mg total) by mouth daily as needed for erectile dysfunction. 10 tablet 0   spironolactone  (ALDACTONE ) 25 MG tablet Take 1 tablet (25 mg total) by mouth daily. 90 tablet 3   torsemide  (DEMADEX ) 20 MG tablet TAKE 2 TABLETS(40 MG) BY MOUTH DAILY 60 tablet 6   XARELTO  20 MG TABS tablet TAKE 1 TABLET(20 MG) BY MOUTH DAILY WITH SUPPER 30 tablet 5   No current facility-administered medications for this visit.    Allergies:   Pacerone  [amiodarone ]   Social History:  The patient  reports that he has never smoked. He has never used smokeless tobacco. He reports current alcohol use. He reports that he does not use drugs.   Family History:  The patient's family history is not on file.  ROS:  Please see the history of present illness.    All other systems are reviewed and otherwise negative.   PHYSICAL EXAM:  VS:  BP 122/84   Pulse 66   Ht 6' 4 (1.93 m)   Wt 285 lb 3.2 oz (129.4 kg)   SpO2 96%   BMI 34.72 kg/m  BMI: Body mass index is 34.72 kg/m. Well nourished, well developed, in no acute distress HEENT: normocephalic, atraumatic Neck: no JVD, carotid bruits or masses Cardiac:  RRR; no significant murmurs, no rubs, or gallops Lungs: CTA b/l, no wheezing, rhonchi or rales Abd: soft, nontender MS: no deformity or atrophy Ext: wearing support stockings, trace if any edema b/l Skin: warm and dry, no rash Neuro:  No gross deficits appreciated Psych: euthymic mood, full affect   EKG:  Done today and reviewed by myself shows  SR 66bpm, PAC, QTc  09/07/23: TTE 1. Left ventricular ejection fraction, by estimation, is 60 to 65%. The  left ventricle has normal function. The left ventricle has no regional  wall motion abnormalities. Left ventricular diastolic parameters are  indeterminate.   2. Right ventricular systolic function is normal. The right ventricular  size is normal.    3. Left atrial size was severely dilated.   4. Right atrial size was mildly dilated.   5. The mitral valve is normal in structure. Mild mitral valve  regurgitation. No evidence of mitral stenosis.   6. The aortic valve is tricuspid. There is mild calcification of the  aortic valve. Aortic valve regurgitation is not visualized. Aortic valve  sclerosis/calcification is present, without any evidence of aortic  stenosis.   7. The inferior vena cava is normal in size with greater than 50%  respiratory variability, suggesting right atrial pressure of 3 mmHg.    05/20/22: TTE (limited) 1. Limited images   2. Left ventricular ejection fraction, by estimation, is 45 to 50%. Left  ventricular ejection fraction by 2D MOD biplane is 47.4 %. Left  ventricular ejection fraction by PLAX is 47 %. The left ventricle has  mildly  decreased function. The left ventricle  demonstrates global hypokinesis. The left ventricular internal cavity size  was moderately dilated. There is moderate left ventricular hypertrophy.  Left ventricular diastolic parameters are indeterminate.   3. Right ventricular systolic function was not well visualized. The right  ventricular size is not well visualized.   4. The mitral valve is abnormal. Mild to moderate mitral valve  regurgitation.   5. The aortic valve is tricuspid. Aortic valve regurgitation is not  visualized.   6. The inferior vena cava is normal in size with greater than 50%  respiratory variability, suggesting right atrial pressure of 3 mmHg.   Comparison(s): Changes from prior study are noted. 03/26/2022: LVEF 25-30%,  severe MR.    03/26/22; TTE 1. Left ventricular ejection fraction, by estimation, is 25 to 30%. The  Left ventricle has severely decreased function. The left ventricle has no  regional wall motion abnormalities. There is moderate asymmetric left  ventricular hypertrophy of the  lateral segment. Left ventricular diastolic function could not be   evaluated. There is the interventricular septum is flattened in systole  and diastole, consistent with right ventricular pressure and volume  overload.   2. Right ventricular systolic function is moderately reduced. The right  ventricular size is moderately enlarged.   3. Left atrial size was severely dilated.   4. Right atrial size was severely dilated.   5. The mitral valve is abnormal. Severe mitral valve regurgitation. No  evidence of mitral stenosis.   6. Tricuspid valve regurgitation is mild to moderate.   7. The aortic valve is tricuspid. Aortic valve regurgitation is trivial.  No aortic stenosis is present.   Comparison(s): Prior images unable to be directly viewed, comparison made  by report only. Changes from prior study are noted. LVEF improved from 20%  in 2019 to 30% now. Mitral regurgitation is severe now.    03/25/22: TEE 1. Left ventricular ejection fraction, by estimation, is 20 to 25%. The  left ventricle has severely decreased function. The left ventricle  demonstrates global hypokinesis.   2. Right ventricular systolic function is moderately reduced. The right  ventricular size is mildly enlarged.   3. Left atrial size was severely dilated. No left atrial/left atrial  appendage thrombus was detected.   4. Right atrial size was severely dilated.   5. There was severe central mitral regurgitation, suspect atrial  functional MR. PISA ERO 0.44 cm^2. There was flattening but not flow  reversal of the pulmonary venous systolic doppler signal.   6. Peak RV-RA gradient 39 mmHg. Tricuspid valve regurgitation is severe.   7. The aortic valve is tricuspid. Aortic valve regurgitation is not  visualized. No aortic stenosis is present.   8. There was a PFO noted by color doppler.    01/15/2018: TTE - Left ventricle: The cavity size was severely dilated. Wall    thickness was normal. Systolic function was mildly reduced. The    estimated ejection fraction was in the range  of 45% to 50%.    Diffuse hypokinesis.  - Mitral valve: Calcified annulus. There was mild regurgitation.  - Left atrium: The atrium was severely dilated.  - Right atrium: The atrium was mildly dilated.   09/12/2017: TEE - Left ventricle: The cavity size was dilated. Systolic function    was severely reduced. The estimated ejection fraction was in the    range of 15% to 20%. Diffuse hypokinesis.  - Aortic valve: No evidence of vegetation.  - Mitral valve: No evidence of  vegetation. There was mild to    moderate regurgitation.  - Left atrium: The atrium was dilated. No evidence of thrombus in    the atrial cavity or appendage. No evidence of thrombus in the    appendage.  - Right ventricle: Systolic function was reduced.  - Right atrium: The atrium was dilated. No evidence of thrombus in    the atrial cavity or appendage.  - Atrial septum: No defect or patent foramen ovale was identified.    There was no right-to-left atrial level shunt, following an    increase in RA pressure induced by provocative maneuvers.  - Tricuspid valve: No evidence of vegetation.  - Pulmonic valve: No evidence of vegetation.    Recent Labs: 06/07/2023: Hemoglobin 13.9; Platelets 152 07/24/2023: ALT 10; B Natriuretic Peptide 84.4; BUN 12; Creatinine, Ser 1.34; Magnesium  2.2; Potassium 4.2; Sodium 138  No results found for requested labs within last 365 days.   CrCl cannot be calculated (Patient's most recent lab result is older than the maximum 21 days allowed.).   Wt Readings from Last 3 Encounters:  10/18/23 285 lb 3.2 oz (129.4 kg)  07/24/23 272 lb 6.4 oz (123.6 kg)  06/07/23 274 lb (124.3 kg)     Other studies reviewed: Additional studies/records reviewed today include: summarized above  ASSESSMENT AND PLAN:  Persistent AFib CHA2DS2Vasc is 3, on Xarelto , appropriately dosed Tikosyn  w/stable QTc Med list reviewed  Labs today   NICM Chronic CHF (systolic) Likely tachy medicated Echo May 2024  > 45-50% > normalized by echo June 2025 No symptoms or exam findings of volume OL C/w Dr. Boone  MR Mild-mod by echo may 2024       Described as severe on historical echos with lower EF       Mild by echo June 2025       C/w HF team  HTN Looks good   6. Secondary hypercoagulable state    Disposition: back in 4 mo, sooner if needed   Current medicines are reviewed at length with the patient today.  The patient did not have any concerns regarding medicines.  Bonney Charlies Arthur, PA-C 10/18/2023 8:05 AM     [facility] [address] [place] [address] [phone] (office)  [phone] (fax)

## 2023-10-18 ENCOUNTER — Ambulatory Visit: Attending: Physician Assistant | Admitting: Physician Assistant

## 2023-10-18 ENCOUNTER — Other Ambulatory Visit (HOSPITAL_COMMUNITY): Payer: Self-pay

## 2023-10-18 ENCOUNTER — Encounter: Payer: Self-pay | Admitting: Physician Assistant

## 2023-10-18 VITALS — BP 122/84 | HR 66 | Ht 76.0 in | Wt 285.2 lb

## 2023-10-18 DIAGNOSIS — D6869 Other thrombophilia: Secondary | ICD-10-CM

## 2023-10-18 DIAGNOSIS — Z79899 Other long term (current) drug therapy: Secondary | ICD-10-CM | POA: Diagnosis not present

## 2023-10-18 DIAGNOSIS — Z5181 Encounter for therapeutic drug level monitoring: Secondary | ICD-10-CM

## 2023-10-18 DIAGNOSIS — I428 Other cardiomyopathies: Secondary | ICD-10-CM | POA: Diagnosis not present

## 2023-10-18 DIAGNOSIS — I4819 Other persistent atrial fibrillation: Secondary | ICD-10-CM | POA: Diagnosis not present

## 2023-10-18 DIAGNOSIS — I10 Essential (primary) hypertension: Secondary | ICD-10-CM | POA: Diagnosis not present

## 2023-10-18 MED ORDER — SPIRONOLACTONE 25 MG PO TABS: 25.0000 mg | ORAL_TABLET | Freq: Every day | ORAL | 3 refills | Status: AC

## 2023-10-18 MED ORDER — SPIRONOLACTONE 25 MG PO TABS
25.0000 mg | ORAL_TABLET | Freq: Every day | ORAL | 3 refills | Status: DC
  Filled 2023-10-18: qty 90, 90d supply, fill #0

## 2023-10-18 NOTE — Patient Instructions (Signed)
 Medication Instructions:    Your physician recommends that you continue on your current medications as directed. Please refer to the Current Medication list given to you today.   *If you need a refill on your cardiac medications before your next appointment, please call your pharmacy*   Lab Work:  PLEASE GO DOWN STAIRS  [facility]  FIRST FLOOR   ( GET OFF ELEVATORS WALK TOWARDS WAITING AREA LAB LOCATED BY PHARMACY): BMET AND MAG TODAY      If you have labs (blood work) drawn today and your tests are completely normal, you will receive your results only by: MyChart Message (if you have MyChart) OR A paper copy in the mail If you have any lab test that is abnormal or we need to change your treatment, we will call you to review the results.   Testing/Procedures:     Follow-Up: At [facility], you and your health needs are our priority.  As part of our continuing mission to provide you with exceptional heart care, our providers are all part of one team.  This team includes your primary Cardiologist (physician) and Advanced Practice Providers or APPs (Physician Assistants and Nurse Practitioners) who all work together to provide you with the care you need, when you need it.  Your next appointment:    4 month(s) ( CONTACT  CASSIE HALL/ ANGELINE HAMMER FOR EP SCHEDULING ISSUES )   Provider:    Charlies Arthur PA-C      We recommend signing up for the patient portal called MyChart.  Sign up information is provided on this After Visit Summary.  MyChart is used to connect with patients for Virtual Visits (Telemedicine).  Patients are able to view lab/test results, encounter notes, upcoming appointments, etc.  Non-urgent messages can be sent to your provider as well.   To learn more about what you can do with MyChart, go to [email or web address].   Other Instructions

## 2023-10-19 ENCOUNTER — Ambulatory Visit: Payer: Self-pay | Admitting: Physician Assistant

## 2023-10-19 LAB — BASIC METABOLIC PANEL WITH GFR
BUN/Creatinine Ratio: 8 — ABNORMAL LOW (ref 10–24)
BUN: 12 mg/dL (ref 8–27)
CO2: 21 mmol/L (ref 20–29)
Calcium: 9.3 mg/dL (ref 8.6–10.2)
Chloride: 102 mmol/L (ref 96–106)
Creatinine, Ser: 1.44 mg/dL — ABNORMAL HIGH (ref 0.76–1.27)
Glucose: 96 mg/dL (ref 70–99)
Potassium: 4.2 mmol/L (ref 3.5–5.2)
Sodium: 139 mmol/L (ref 134–144)
eGFR: 52 mL/min/1.73 — ABNORMAL LOW (ref >59)

## 2023-10-19 LAB — MAGNESIUM: Magnesium: 2.2 mg/dL (ref 1.6–2.3)

## 2023-10-23 ENCOUNTER — Other Ambulatory Visit (HOSPITAL_COMMUNITY): Payer: Self-pay

## 2023-10-23 ENCOUNTER — Other Ambulatory Visit: Payer: Self-pay

## 2023-10-29 ENCOUNTER — Other Ambulatory Visit: Payer: Self-pay | Admitting: Internal Medicine

## 2023-11-01 ENCOUNTER — Other Ambulatory Visit (HOSPITAL_COMMUNITY): Payer: Self-pay

## 2023-11-01 MED ORDER — POTASSIUM CHLORIDE CRYS ER 20 MEQ PO TBCR: 20.0000 meq | EXTENDED_RELEASE_TABLET | Freq: Every day | ORAL | 6 refills | Status: AC

## 2023-12-29 ENCOUNTER — Telehealth (HOSPITAL_COMMUNITY): Payer: Self-pay

## 2023-12-29 NOTE — Telephone Encounter (Signed)
 Advanced Heart Failure Patient Advocate Encounter  Received notification from [employer] that patient has been conditionally approved for 2026 but needs to submit updated authorization form. Contacted patient by phone, patient consents to advocate signature for authorization.  Re-enrollment form faxed on 12/29/2023.  Rachel DEL, CPhT Rx Patient Advocate Phone: [phone]

## 2024-01-01 NOTE — Telephone Encounter (Signed)
 Patient was approved to receive Farxiga  from [employer] Effective 03/21/2024 to 03/20/2025

## 2024-01-03 NOTE — Telephone Encounter (Signed)
 Received shipment notification from [employer] that Farxiga  was sent out on 01/01/2024.

## 2024-02-02 NOTE — Progress Notes (Deleted)
 Cardiology Office Note Date:  02/02/2024  Patient ID:  [name], [name] 05/21/1950, MRN [id number] PCP:  Benjamine Aland, MD  Cardiologist:  Dr. Cherrie Electrophysiologist: Dr. Inocencio    Chief Complaint:  Tikosyn  visit  History of Present Illness: [name] is a 73 y.o. male with history of  HTN,  NICM, chronic CHF (systolic),  AFib  He was admitted 06/14/22 via the HF clinic with acute/chronic CHF exacerbation and rapid Aflutter.  He was diuresed and then started on Tikosyn . He did have some QT lengthening and dose reduced with stabilization. Discharged 06/20/22.  Following with EP and HF teams since  Saw Dr. Inocencio 07/29/22, maintaining SR, no changes were made  Saw HF team 08/29/22, not SOB walking or pushing grocery cart. He is back at work (works in [employer], lifting heavy boxes), torsemide  increased/K+ added  I saw him 06/07/23 He is doing well A few months ago retired (semi-forced retirement, but happy with it) Not as active as he was on the job, though plans to get moving/walking/exercising No CP, palpitations, cardiac awareness Checks his heart rhythm on his Kardia at least once a week with no signs of AFib No SOB No near syncope or syncope No bleeding or signs of bleeding Stable EKG/QTc Meds ok, planned for labs  He saw Dr. Cherrie 07/24/23, doing well, fairly sedentary EKG looked OK Encouraged more activity, planned for updated echo  LVEF 60-65%, no WMA, mild MR  I saw her 10/18/23 He continues to do very well Retired and a bit bored, looking for a job to keep him busier. No CP, palpitations or cardiac awareness No difficulties with his ADLs No symptoms of AFIb Reports excellent medication compliance Stable QTc No changes made   TODAY  *** symptoms *** burden *** Tikosyn  EKG, med list, labs *** xarelto , dose, bleeding, labs   He continues to do very well Retired and a bit bored, looking for a job to keep him busier. No CP,  palpitations or cardiac awareness No difficulties with his ADLs No symptoms of AFIb Reports excellent medication compliance    AFib/AAD hx Goes as far back as 2012 then a known diagnosis convergent ablation in 2012,  Dr. 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 2009 EF 25%; b. 05/2010 Cath: nl cors. EF 45%; c. 12/2017 Echo: EF 45-50%, diff HK, mild MR, sev dil LA.   Paroxysmal atrial flutter (HCC)    a. Dx in 05/2018.   Persistent atrial fibrillation (HCC)    a. 2012 s/p RFCA; b. 08/2017 s/p DCCV.    Past Surgical History:  Procedure Laterality Date   CARDIAC ELECTROPHYSIOLOGY STUDY AND ABLATION  2012   Dr. Debbi   CARDIOVERSION N/A 09/12/2017   Procedure: CARDIOVERSION;  Surgeon: Jeffrie Oneil BROCKS, MD;  Location: [facility] ENDOSCOPY;  Service: Cardiovascular;  Laterality: N/A;   CARDIOVERSION N/A 03/25/2022   Procedure: CARDIOVERSION;  Surgeon: Rolan Ezra RAMAN, MD;  Location: [facility] ENDOSCOPY;  Service: Cardiovascular;  Laterality: N/A;   LOOP RECORDER IMPLANT  06/11/2010   TEE WITHOUT CARDIOVERSION N/A 09/12/2017   Procedure: TRANSESOPHAGEAL ECHOCARDIOGRAM (TEE);  Surgeon: Jeffrie Oneil BROCKS, MD;  Location: [facility] ENDOSCOPY;  Service: Cardiovascular;  Laterality: N/A;   TEE WITHOUT CARDIOVERSION N/A 03/25/2022   Procedure: TRANSESOPHAGEAL ECHOCARDIOGRAM (TEE);  Surgeon: Rolan Ezra RAMAN, MD;  Location: [facility] ENDOSCOPY;  Service: Cardiovascular;  Laterality: N/A;    Current Outpatient Medications  Medication Sig Dispense Refill   dapagliflozin  propanediol (FARXIGA ) 10 MG TABS tablet TAKE 1 TABLET(10 MG) BY MOUTH DAILY 30 tablet 11   dofetilide  (TIKOSYN ) 250 MCG capsule Take 1 capsule (250 mcg total) by mouth 2  (two) times daily. 180 capsule 2   metoprolol  succinate (TOPROL -XL) 25 MG 24 hr tablet TAKE 1 TABLET(25 MG) BY MOUTH DAILY 30 tablet 5   OVER THE COUNTER MEDICATION Take 5 mLs by mouth in the morning. Black seed oil.     potassium chloride  SA (KLOR-CON  M) 20 MEQ tablet Take 1 tablet (20 mEq total) by mouth daily. 30 tablet 6   sacubitril -valsartan  (ENTRESTO ) 24-26 MG Take 1 tablet by mouth 2 (two) times daily. 60 tablet 11   sildenafil  (VIAGRA ) 25 MG tablet Take 1 tablet (25 mg total) by mouth daily as needed for erectile dysfunction. 10 tablet 0   spironolactone  (ALDACTONE ) 25 MG tablet Take 1 tablet (25 mg total) by mouth daily. 90 tablet 3   torsemide  (DEMADEX ) 20 MG tablet TAKE 2 TABLETS(40 MG) BY MOUTH DAILY 60 tablet 6   XARELTO  20 MG TABS tablet TAKE 1 TABLET(20 MG) BY MOUTH DAILY WITH SUPPER 30 tablet 5   No current facility-administered medications for this visit.    Allergies:   Pacerone  [amiodarone ]   Social History:  The patient  reports that he has never smoked. He has never used smokeless tobacco. He reports current alcohol use. He reports that he does not use drugs.   Family History:  The patient's family history is not on file.  ROS:  Please see the history of present illness.    All other systems are reviewed and otherwise negative.   PHYSICAL EXAM:  VS:  There were no vitals taken for this visit. BMI: There is no height or weight on file to calculate BMI. Well nourished, well developed, in no acute distress HEENT: normocephalic, atraumatic Neck: no JVD, carotid bruits or masses Cardiac: *** RRR; no significant murmurs, no rubs, or gallops Lungs: *** CTA b/l, no wheezing, rhonchi or rales Abd: soft, nontender MS: no deformity or atrophy Ext:  *** wearing support stockings, trace if any edema b/l Skin: warm and dry, no rash Neuro:  No gross deficits appreciated Psych: euthymic mood, full affect   EKG:  Done today and reviewed by myself shows  ***  10/18/23: SR  66bpm, PAC, QTc  09/07/23: TTE 1. Left ventricular ejection fraction, by estimation, is 60 to 65%. The  left ventricle has normal function. The left ventricle has no regional  wall motion abnormalities. Left ventricular diastolic parameters are  indeterminate.   2. Right ventricular systolic function is normal. The right ventricular  size is normal.   3. Left atrial size was severely dilated.   4. Right atrial size was mildly dilated.   5. The mitral valve is normal in structure. Mild mitral valve  regurgitation. No evidence of mitral stenosis.   6. The aortic valve is tricuspid. There is mild calcification of the  aortic valve. Aortic valve regurgitation is not visualized. Aortic valve  sclerosis/calcification is present, without any evidence of aortic  stenosis.   7. The inferior vena cava is normal in size with greater than 50%  respiratory variability, suggesting right atrial pressure  of 3 mmHg.    05/20/22: TTE (limited) 1. Limited images   2. Left ventricular ejection fraction, by estimation, is 45 to 50%. Left  ventricular ejection fraction by 2D MOD biplane is 47.4 %. Left  ventricular ejection fraction by PLAX is 47 %. The left ventricle has  mildly decreased function. The left ventricle  demonstrates global hypokinesis. The left ventricular internal cavity size  was moderately dilated. There is moderate left ventricular hypertrophy.  Left ventricular diastolic parameters are indeterminate.   3. Right ventricular systolic function was not well visualized. The right  ventricular size is not well visualized.   4. The mitral valve is abnormal. Mild to moderate mitral valve  regurgitation.   5. The aortic valve is tricuspid. Aortic valve regurgitation is not  visualized.   6. The inferior vena cava is normal in size with greater than 50%  respiratory variability, suggesting right atrial pressure of 3 mmHg.   Comparison(s): Changes from prior study are noted. 03/26/2022:  LVEF 25-30%,  severe MR.    03/26/22; TTE 1. Left ventricular ejection fraction, by estimation, is 25 to 30%. The  Left ventricle has severely decreased function. The left ventricle has no  regional wall motion abnormalities. There is moderate asymmetric left  ventricular hypertrophy of the  lateral segment. Left ventricular diastolic function could not be  evaluated. There is the interventricular septum is flattened in systole  and diastole, consistent with right ventricular pressure and volume  overload.   2. Right ventricular systolic function is moderately reduced. The right  ventricular size is moderately enlarged.   3. Left atrial size was severely dilated.   4. Right atrial size was severely dilated.   5. The mitral valve is abnormal. Severe mitral valve regurgitation. No  evidence of mitral stenosis.   6. Tricuspid valve regurgitation is mild to moderate.   7. The aortic valve is tricuspid. Aortic valve regurgitation is trivial.  No aortic stenosis is present.   Comparison(s): Prior images unable to be directly viewed, comparison made  by report only. Changes from prior study are noted. LVEF improved from 20%  in 2019 to 30% now. Mitral regurgitation is severe now.    03/25/22: TEE 1. Left ventricular ejection fraction, by estimation, is 20 to 25%. The  left ventricle has severely decreased function. The left ventricle  demonstrates global hypokinesis.   2. Right ventricular systolic function is moderately reduced. The right  ventricular size is mildly enlarged.   3. Left atrial size was severely dilated. No left atrial/left atrial  appendage thrombus was detected.   4. Right atrial size was severely dilated.   5. There was severe central mitral regurgitation, suspect atrial  functional MR. PISA ERO 0.44 cm^2. There was flattening but not flow  reversal of the pulmonary venous systolic doppler signal.   6. Peak RV-RA gradient 39 mmHg. Tricuspid valve regurgitation is  severe.   7. The aortic valve is tricuspid. Aortic valve regurgitation is not  visualized. No aortic stenosis is present.   8. There was a PFO noted by color doppler.    01/15/2018: TTE - Left ventricle: The cavity size was severely dilated. Wall    thickness was normal. Systolic function was mildly reduced. The    estimated ejection fraction was in the range of 45% to 50%.    Diffuse hypokinesis.  - Mitral valve: Calcified annulus. There was mild regurgitation.  - Left atrium: The atrium was severely dilated.  - Right atrium: The atrium was mildly  dilated.   09/12/2017: TEE - Left ventricle: The cavity size was dilated. Systolic function    was severely reduced. The estimated ejection fraction was in the    range of 15% to 20%. Diffuse hypokinesis.  - Aortic valve: No evidence of vegetation.  - Mitral valve: No evidence of vegetation. There was mild to    moderate regurgitation.  - Left atrium: The atrium was dilated. No evidence of thrombus in    the atrial cavity or appendage. No evidence of thrombus in the    appendage.  - Right ventricle: Systolic function was reduced.  - Right atrium: The atrium was dilated. No evidence of thrombus in    the atrial cavity or appendage.  - Atrial septum: No defect or patent foramen ovale was identified.    There was no right-to-left atrial level shunt, following an    increase in RA pressure induced by provocative maneuvers.  - Tricuspid valve: No evidence of vegetation.  - Pulmonic valve: No evidence of vegetation.    Recent Labs: 06/07/2023: Hemoglobin 13.9; Platelets 152 07/24/2023: ALT 10; B Natriuretic Peptide 84.4 10/18/2023: BUN 12; Creatinine, Ser 1.44; Magnesium  2.2; Potassium 4.2; Sodium 139  No results found for requested labs within last 365 days.   CrCl cannot be calculated (Patient's most recent lab result is older than the maximum 21 days allowed.).   Wt Readings from Last 3 Encounters:  10/18/23 285 lb 3.2 oz (129.4 kg)   07/24/23 272 lb 6.4 oz (123.6 kg)  06/07/23 274 lb (124.3 kg)     Other studies reviewed: Additional studies/records reviewed today include: summarized above  ASSESSMENT AND PLAN:  Persistent AFib CHA2DS2Vasc is 3, on Xarelto , *** appropriately dosed Tikosyn  w/*** stable QTc *** burden by symptoms  *** Med list reviewed  *** Labs today   NICM Chronic CHF (systolic) Likely tachy medicated Echo May 2024 > 45-50% > normalized by echo June 2025 *** No symptoms or exam findings of volume OL C/w Dr. Boone  MR Mild-mod by echo may 2024       Described as severe on historical echos with lower EF       Mild by echo June 2025       C/w HF team  HTN *** Looks good   6. Secondary hypercoagulable state    Disposition: back in *** mo, sooner if needed   Current medicines are reviewed at length with the patient today.  The patient did not have any concerns regarding medicines.  Bonney Charlies Arthur, PA-C 02/02/2024 2:37 PM     [facility] [address] [place] [address] [phone] (office)  [phone] (fax)

## 2024-02-12 ENCOUNTER — Ambulatory Visit: Admitting: Physician Assistant

## 2024-03-04 ENCOUNTER — Other Ambulatory Visit (HOSPITAL_COMMUNITY): Payer: Self-pay

## 2024-03-04 ENCOUNTER — Other Ambulatory Visit: Payer: Self-pay

## 2024-03-05 ENCOUNTER — Other Ambulatory Visit (HOSPITAL_COMMUNITY): Payer: Self-pay

## 2024-03-10 NOTE — Progress Notes (Unsigned)
 "    Cardiology Office Note Date:  03/10/2024  Patient ID:  [name], [name] 27-Nov-1950, MRN [id number] PCP:  Benjamine Aland, MD  Cardiologist:  Dr. Cherrie Electrophysiologist: Dr. Inocencio    Chief Complaint:  Tikosyn  visit  History of Present Illness: [name] is a 73 y.o. male with history of  HTN,  NICM, chronic CHF (systolic),  AFib  He was admitted 06/14/22 via the HF clinic with acute/chronic CHF exacerbation and rapid Aflutter.  He was diuresed and then started on Tikosyn . He did have some QT lengthening and dose reduced with stabilization. Discharged 06/20/22.  Following with EP and HF teams since  Saw Dr. Inocencio 07/29/22, maintaining SR, no changes were made  Saw HF team 08/29/22, not SOB walking or pushing grocery cart. He is back at work (works in [employer], lifting heavy boxes), torsemide  increased/K+ added  I saw him 06/07/23 He is doing well A few months ago retired (semi-forced retirement, but happy with it) Not as active as he was on the job, though plans to get moving/walking/exercising No CP, palpitations, cardiac awareness Checks his heart rhythm on his Kardia at least once a week with no signs of AFib No SOB No near syncope or syncope No bleeding or signs of bleeding Stable EKG/QTc Meds ok, planned for labs  He saw Dr. Cherrie 07/24/23, doing well, fairly sedentary EKG looked OK Encouraged more activity, planned for updated echo  LVEF 60-65%, no WMA, mild MR  I saw her 10/18/23 He continues to do very well Retired and a bit bored, looking for a job to keep him busier. No CP, palpitations or cardiac awareness No difficulties with his ADLs No symptoms of AFIb Reports excellent medication compliance Stable QTc No changes made   TODAY  He continues to do very well Again mentions a bit bored, more sedentary then he should be since retiring, hasn't found his retirement thing yet No CP, palpitations or cardiac awareness No difficulties  with his ADLs No symptoms of AFIb Reports excellent medication compliance    AFib/AAD hx Goes as far back as 2012 then a known diagnosis convergent ablation in 2012,  Dr. 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 2009 EF 25%; b. 05/2010 Cath: nl cors. EF 45%; c. 12/2017 Echo: EF 45-50%, diff HK, mild MR, sev dil LA.   Paroxysmal atrial flutter (HCC)    a. Dx in 05/2018.   Persistent atrial fibrillation (HCC)    a. 2012 s/p RFCA; b. 08/2017 s/p DCCV.    Past Surgical History:  Procedure Laterality Date   CARDIAC ELECTROPHYSIOLOGY STUDY AND ABLATION  2012   Dr. Debbi   CARDIOVERSION N/A 09/12/2017   Procedure: CARDIOVERSION;  Surgeon: Jeffrie Oneil BROCKS, MD;  Location: [facility] ENDOSCOPY;  Service: Cardiovascular;  Laterality: N/A;   CARDIOVERSION N/A 03/25/2022   Procedure: CARDIOVERSION;  Surgeon: Rolan Ezra RAMAN, MD;  Location: [facility] ENDOSCOPY;  Service: Cardiovascular;  Laterality: N/A;   LOOP RECORDER IMPLANT  06/11/2010   TEE WITHOUT CARDIOVERSION N/A 09/12/2017   Procedure: TRANSESOPHAGEAL ECHOCARDIOGRAM (TEE);  Surgeon: Jeffrie Oneil BROCKS, MD;  Location: [facility] ENDOSCOPY;  Service: Cardiovascular;  Laterality: N/A;   TEE WITHOUT  CARDIOVERSION N/A 03/25/2022   Procedure: TRANSESOPHAGEAL ECHOCARDIOGRAM (TEE);  Surgeon: Rolan Ezra RAMAN, MD;  Location: [facility] ENDOSCOPY;  Service: Cardiovascular;  Laterality: N/A;    Current Outpatient Medications  Medication Sig Dispense Refill   dapagliflozin  propanediol (FARXIGA ) 10 MG TABS tablet TAKE 1 TABLET(10 MG) BY MOUTH DAILY 30 tablet 11   dofetilide  (TIKOSYN ) 250 MCG capsule Take 1 capsule (250 mcg total) by mouth 2 (two) times daily. 180 capsule 2   metoprolol   succinate (TOPROL -XL) 25 MG 24 hr tablet TAKE 1 TABLET(25 MG) BY MOUTH DAILY 30 tablet 5   OVER THE COUNTER MEDICATION Take 5 mLs by mouth in the morning. Black seed oil.     potassium chloride  SA (KLOR-CON  M) 20 MEQ tablet Take 1 tablet (20 mEq total) by mouth daily. 30 tablet 6   sacubitril -valsartan  (ENTRESTO ) 24-26 MG Take 1 tablet by mouth 2 (two) times daily. 60 tablet 11   sildenafil  (VIAGRA ) 25 MG tablet Take 1 tablet (25 mg total) by mouth daily as needed for erectile dysfunction. 10 tablet 0   spironolactone  (ALDACTONE ) 25 MG tablet Take 1 tablet (25 mg total) by mouth daily. 90 tablet 3   torsemide  (DEMADEX ) 20 MG tablet TAKE 2 TABLETS(40 MG) BY MOUTH DAILY 60 tablet 6   XARELTO  20 MG TABS tablet TAKE 1 TABLET(20 MG) BY MOUTH DAILY WITH SUPPER 30 tablet 5   No current facility-administered medications for this visit.    Allergies:   Pacerone  [amiodarone ]   Social History:  The patient  reports that he has never smoked. He has never used smokeless tobacco. He reports current alcohol use. He reports that he does not use drugs.   Family History:  The patient's family history is not on file.  ROS:  Please see the history of present illness.    All other systems are reviewed and otherwise negative.   PHYSICAL EXAM:  VS:  There were no vitals taken for this visit. BMI: There is no height or weight on file to calculate BMI. Well nourished, well developed, in no acute distress HEENT: normocephalic, atraumatic Neck: no JVD, carotid bruits or masses Cardiac: RRR; no significant murmurs, no rubs, or gallops Lungs: CTA b/l, no wheezing, rhonchi or rales Abd: soft, nontender MS: no deformity or atrophy Ext:  wearing support stockings, trace if any edema b/l Skin: warm and dry, no rash Neuro:  No gross deficits appreciated Psych: euthymic mood, full affect   EKG:  Done today and reviewed by myself shows  SR 82bpm, PAC, QTc  10/18/23: SR 66bpm, PAC, QTc  09/07/23:  TTE 1. Left ventricular ejection fraction, by estimation, is 60 to 65%. The  left ventricle has normal function. The left ventricle has no regional  wall motion abnormalities. Left ventricular diastolic parameters are  indeterminate.   2. Right ventricular systolic function is normal. The right ventricular  size is normal.   3. Left atrial size was severely dilated.   4. Right atrial size was mildly dilated.   5. The mitral valve is normal in structure. Mild mitral valve  regurgitation. No evidence of mitral stenosis.   6. The aortic valve is tricuspid. There is mild calcification of the  aortic valve. Aortic valve regurgitation is not visualized. Aortic valve  sclerosis/calcification is present, without any evidence of aortic  stenosis.   7. The inferior vena cava is normal in size with greater than 50%  respiratory variability, suggesting right atrial pressure of 3 mmHg.    05/20/22: TTE (  limited) 1. Limited images   2. Left ventricular ejection fraction, by estimation, is 45 to 50%. Left  ventricular ejection fraction by 2D MOD biplane is 47.4 %. Left  ventricular ejection fraction by PLAX is 47 %. The left ventricle has  mildly decreased function. The left ventricle  demonstrates global hypokinesis. The left ventricular internal cavity size  was moderately dilated. There is moderate left ventricular hypertrophy.  Left ventricular diastolic parameters are indeterminate.   3. Right ventricular systolic function was not well visualized. The right  ventricular size is not well visualized.   4. The mitral valve is abnormal. Mild to moderate mitral valve  regurgitation.   5. The aortic valve is tricuspid. Aortic valve regurgitation is not  visualized.   6. The inferior vena cava is normal in size with greater than 50%  respiratory variability, suggesting right atrial pressure of 3 mmHg.   Comparison(s): Changes from prior study are noted. 03/26/2022: LVEF 25-30%,  severe MR.     03/26/22; TTE 1. Left ventricular ejection fraction, by estimation, is 25 to 30%. The  Left ventricle has severely decreased function. The left ventricle has no  regional wall motion abnormalities. There is moderate asymmetric left  ventricular hypertrophy of the  lateral segment. Left ventricular diastolic function could not be  evaluated. There is the interventricular septum is flattened in systole  and diastole, consistent with right ventricular pressure and volume  overload.   2. Right ventricular systolic function is moderately reduced. The right  ventricular size is moderately enlarged.   3. Left atrial size was severely dilated.   4. Right atrial size was severely dilated.   5. The mitral valve is abnormal. Severe mitral valve regurgitation. No  evidence of mitral stenosis.   6. Tricuspid valve regurgitation is mild to moderate.   7. The aortic valve is tricuspid. Aortic valve regurgitation is trivial.  No aortic stenosis is present.   Comparison(s): Prior images unable to be directly viewed, comparison made  by report only. Changes from prior study are noted. LVEF improved from 20%  in 2019 to 30% now. Mitral regurgitation is severe now.    03/25/22: TEE 1. Left ventricular ejection fraction, by estimation, is 20 to 25%. The  left ventricle has severely decreased function. The left ventricle  demonstrates global hypokinesis.   2. Right ventricular systolic function is moderately reduced. The right  ventricular size is mildly enlarged.   3. Left atrial size was severely dilated. No left atrial/left atrial  appendage thrombus was detected.   4. Right atrial size was severely dilated.   5. There was severe central mitral regurgitation, suspect atrial  functional MR. PISA ERO 0.44 cm^2. There was flattening but not flow  reversal of the pulmonary venous systolic doppler signal.   6. Peak RV-RA gradient 39 mmHg. Tricuspid valve regurgitation is severe.   7. The aortic valve  is tricuspid. Aortic valve regurgitation is not  visualized. No aortic stenosis is present.   8. There was a PFO noted by color doppler.    01/15/2018: TTE - Left ventricle: The cavity size was severely dilated. Wall    thickness was normal. Systolic function was mildly reduced. The    estimated ejection fraction was in the range of 45% to 50%.    Diffuse hypokinesis.  - Mitral valve: Calcified annulus. There was mild regurgitation.  - Left atrium: The atrium was severely dilated.  - Right atrium: The atrium was mildly dilated.   09/12/2017: TEE - Left ventricle:  The cavity size was dilated. Systolic function    was severely reduced. The estimated ejection fraction was in the    range of 15% to 20%. Diffuse hypokinesis.  - Aortic valve: No evidence of vegetation.  - Mitral valve: No evidence of vegetation. There was mild to    moderate regurgitation.  - Left atrium: The atrium was dilated. No evidence of thrombus in    the atrial cavity or appendage. No evidence of thrombus in the    appendage.  - Right ventricle: Systolic function was reduced.  - Right atrium: The atrium was dilated. No evidence of thrombus in    the atrial cavity or appendage.  - Atrial septum: No defect or patent foramen ovale was identified.    There was no right-to-left atrial level shunt, following an    increase in RA pressure induced by provocative maneuvers.  - Tricuspid valve: No evidence of vegetation.  - Pulmonic valve: No evidence of vegetation.    Recent Labs: 06/07/2023: Hemoglobin 13.9; Platelets 152 07/24/2023: ALT 10; B Natriuretic Peptide 84.4 10/18/2023: BUN 12; Creatinine, Ser 1.44; Magnesium  2.2; Potassium 4.2; Sodium 139  No results found for requested labs within last 365 days.   CrCl cannot be calculated (Patient's most recent lab result is older than the maximum 21 days allowed.).   Wt Readings from Last 3 Encounters:  10/18/23 285 lb 3.2 oz (129.4 kg)  07/24/23 272 lb 6.4 oz (123.6  kg)  06/07/23 274 lb (124.3 kg)     Other studies reviewed: Additional studies/records reviewed today include: summarized above  ASSESSMENT AND PLAN:  Persistent AFib CHA2DS2Vasc is 3, on Xarelto , appropriately dosed Tikosyn  w/stable QTc Low/no burden by symptoms  Med list reviewed  Tikosyn  teaching re-enforced Labs today  I think after his next visit (~32 years on Tikosyn ), if remains stable, could push out to Q 6 mo visits   NICM Chronic CHF (systolic) Likely tachy medicated Echo May 2024 > 45-50% > normalized by echo June 2025 No symptoms or exam findings of volume OL C/w Dr. Boone  MR Mild-mod by echo may 2024       Described as severe on historical echos with lower EF       Mild by echo June 2025       C/w HF team  HTN Looks good   6. Secondary hypercoagulable state    Disposition: back in 86mo, sooner if needed   Current medicines are reviewed at length with the patient today.  The patient did not have any concerns regarding medicines.  Bonney Charlies Arthur, PA-C 03/10/2024 4:45 PM     [facility] [address] [place] [address] [phone] (office)  [phone] (fax)   "

## 2024-03-12 ENCOUNTER — Ambulatory Visit: Attending: Physician Assistant | Admitting: Physician Assistant

## 2024-03-12 VITALS — BP 124/76 | HR 82 | Ht 76.0 in | Wt 289.0 lb

## 2024-03-12 DIAGNOSIS — I428 Other cardiomyopathies: Secondary | ICD-10-CM

## 2024-03-12 DIAGNOSIS — I10 Essential (primary) hypertension: Secondary | ICD-10-CM

## 2024-03-12 DIAGNOSIS — Z5181 Encounter for therapeutic drug level monitoring: Secondary | ICD-10-CM | POA: Diagnosis not present

## 2024-03-12 DIAGNOSIS — I4819 Other persistent atrial fibrillation: Secondary | ICD-10-CM

## 2024-03-12 DIAGNOSIS — Z79899 Other long term (current) drug therapy: Secondary | ICD-10-CM

## 2024-03-12 LAB — CBC

## 2024-03-12 NOTE — Patient Instructions (Signed)
 Medication Instructions:   Your physician recommends that you continue on your current medications as directed. Please refer to the Current Medication list given to you today.  *If you need a refill on your cardiac medications before your next appointment, please call your pharmacy*  Lab Work:   PLEASE GO DOWN STAIRS  [facility]  FIRST FLOOR   ( GET OFF ELEVATORS WALK TOWARDS WAITING AREA LAB LOCATED BY PHARMACY):    If you have labs (blood work) drawn today and your tests are completely normal, you will receive your results only by: MyChart Message (if you have MyChart) OR A paper copy in the mail If you have any lab test that is abnormal or we need to change your treatment, we will call you to review the results.  Testing/Procedures: NONE ORDERED  TODAY    Follow-Up: At [facility], you and your health needs are our priority.  As part of our continuing mission to provide you with exceptional heart care, our providers are all part of one team.  This team includes your primary Cardiologist (physician) and Advanced Practice Providers or APPs (Physician Assistants and Nurse Practitioners) who all work together to provide you with the care you need, when you need it.  Your next appointment:   4 month(s)  Provider:   Soyla Norton, MD or Charlies Arthur, PA-C  ( CONTACT  CASSIE HALL/ ANGELINE HAMMER FOR EP SCHEDULING ISSUES )   We recommend signing up for the patient portal called MyChart.  Sign up information is provided on this After Visit Summary.  MyChart is used to connect with patients for Virtual Visits (Telemedicine).  Patients are able to view lab/test results, encounter notes, upcoming appointments, etc.  Non-urgent messages can be sent to your provider as well.   To learn more about what you can do with MyChart, go to [email or web address].   Other Instructions

## 2024-03-13 ENCOUNTER — Ambulatory Visit: Payer: Self-pay | Admitting: Physician Assistant

## 2024-03-13 LAB — CBC
Hematocrit: 43.4 % (ref 37.5–51.0)
Hemoglobin: 15 g/dL (ref 13.0–17.7)
MCH: 32.1 pg (ref 26.6–33.0)
MCHC: 34.6 g/dL (ref 31.5–35.7)
MCV: 93 fL (ref 79–97)
Platelets: 139 x10E3/uL — ABNORMAL LOW (ref 150–450)
RBC: 4.68 x10E6/uL (ref 4.14–5.80)
RDW: 13.1 % (ref 11.6–15.4)
WBC: 3.9 x10E3/uL (ref 3.4–10.8)

## 2024-03-13 LAB — BASIC METABOLIC PANEL WITH GFR
BUN/Creatinine Ratio: 15 (ref 10–24)
BUN: 27 mg/dL (ref 8–27)
CO2: 22 mmol/L (ref 20–29)
Calcium: 9.3 mg/dL (ref 8.6–10.2)
Chloride: 100 mmol/L (ref 96–106)
Creatinine, Ser: 1.77 mg/dL — ABNORMAL HIGH (ref 0.76–1.27)
Glucose: 98 mg/dL (ref 70–99)
Potassium: 4 mmol/L (ref 3.5–5.2)
Sodium: 139 mmol/L (ref 134–144)
eGFR: 40 mL/min/1.73 — ABNORMAL LOW

## 2024-03-13 LAB — MAGNESIUM: Magnesium: 2.1 mg/dL (ref 1.6–2.3)

## 2024-04-10 ENCOUNTER — Other Ambulatory Visit (HOSPITAL_COMMUNITY): Payer: Self-pay | Admitting: Cardiology

## 2024-04-10 MED ORDER — TORSEMIDE 20 MG PO TABS: 40.0000 mg | ORAL_TABLET | Freq: Every day | ORAL | 6 refills | Status: AC

## 2024-07-09 ENCOUNTER — Ambulatory Visit: Admitting: Physician Assistant
